# Patient Record
Sex: Male | Born: 1955
Health system: Southern US, Community
[De-identification: ages and names within clinical notes are randomized; demographics above are authoritative.]

## PROBLEM LIST (undated history)

## (undated) DIAGNOSIS — K219 Gastro-esophageal reflux disease without esophagitis: Secondary | ICD-10-CM

## (undated) DIAGNOSIS — E785 Hyperlipidemia, unspecified: Secondary | ICD-10-CM

## (undated) DIAGNOSIS — C3490 Malignant neoplasm of unspecified part of unspecified bronchus or lung: Secondary | ICD-10-CM

## (undated) DIAGNOSIS — M7552 Bursitis of left shoulder: Secondary | ICD-10-CM

## (undated) DIAGNOSIS — G2581 Restless legs syndrome: Secondary | ICD-10-CM

## (undated) HISTORY — PX: COLONOSCOPY: SHX174

## (undated) HISTORY — DX: Gastro-esophageal reflux disease without esophagitis: K21.9

## (undated) HISTORY — DX: Hyperlipidemia, unspecified: E78.5

## (undated) HISTORY — DX: Restless legs syndrome: G25.81

## (undated) HISTORY — DX: Bursitis of left shoulder: M75.52

---

## 2003-10-05 ENCOUNTER — Emergency Department (HOSPITAL_COMMUNITY): Admission: AD | Admit: 2003-10-05 | Discharge: 2003-10-05 | Payer: Self-pay | Admitting: Family Medicine

## 2003-10-08 ENCOUNTER — Emergency Department (HOSPITAL_COMMUNITY): Admission: AD | Admit: 2003-10-08 | Discharge: 2003-10-08 | Payer: Self-pay | Admitting: Family Medicine

## 2003-10-22 ENCOUNTER — Encounter: Admission: RE | Admit: 2003-10-22 | Discharge: 2003-10-22 | Payer: Self-pay | Admitting: Family Medicine

## 2003-10-27 ENCOUNTER — Encounter: Admission: RE | Admit: 2003-10-27 | Discharge: 2003-10-27 | Payer: Self-pay | Admitting: Family Medicine

## 2003-11-12 ENCOUNTER — Encounter: Admission: RE | Admit: 2003-11-12 | Discharge: 2003-11-12 | Payer: Self-pay | Admitting: Family Medicine

## 2003-12-13 ENCOUNTER — Encounter: Admission: RE | Admit: 2003-12-13 | Discharge: 2003-12-13 | Payer: Self-pay | Admitting: Family Medicine

## 2003-12-13 HISTORY — PX: CIRCUMCISION: SUR203

## 2004-06-13 ENCOUNTER — Emergency Department (HOSPITAL_COMMUNITY): Admission: EM | Admit: 2004-06-13 | Discharge: 2004-06-13 | Payer: Self-pay | Admitting: *Deleted

## 2006-11-14 DIAGNOSIS — K219 Gastro-esophageal reflux disease without esophagitis: Secondary | ICD-10-CM | POA: Insufficient documentation

## 2006-11-14 DIAGNOSIS — F528 Other sexual dysfunction not due to a substance or known physiological condition: Secondary | ICD-10-CM | POA: Insufficient documentation

## 2006-11-14 DIAGNOSIS — IMO0002 Reserved for concepts with insufficient information to code with codable children: Secondary | ICD-10-CM | POA: Insufficient documentation

## 2006-11-14 DIAGNOSIS — M75100 Unspecified rotator cuff tear or rupture of unspecified shoulder, not specified as traumatic: Secondary | ICD-10-CM | POA: Insufficient documentation

## 2006-11-14 DIAGNOSIS — E7800 Pure hypercholesterolemia, unspecified: Secondary | ICD-10-CM | POA: Insufficient documentation

## 2010-04-17 ENCOUNTER — Ambulatory Visit: Payer: Self-pay | Admitting: Cardiovascular Disease

## 2010-04-27 ENCOUNTER — Ambulatory Visit (HOSPITAL_COMMUNITY): Admission: RE | Admit: 2010-04-27 | Discharge: 2010-04-27 | Payer: Self-pay | Admitting: Interventional Cardiology

## 2010-08-24 ENCOUNTER — Observation Stay (HOSPITAL_COMMUNITY): Admission: EM | Admit: 2010-08-24 | Discharge: 2010-04-18 | Payer: Self-pay | Admitting: Emergency Medicine

## 2010-12-01 LAB — COMPREHENSIVE METABOLIC PANEL
ALT: 12 U/L (ref 0–53)
ALT: 13 U/L (ref 0–53)
AST: 23 U/L (ref 0–37)
AST: 24 U/L (ref 0–37)
Albumin: 3.1 g/dL — ABNORMAL LOW (ref 3.5–5.2)
Albumin: 3.4 g/dL — ABNORMAL LOW (ref 3.5–5.2)
Alkaline Phosphatase: 56 U/L (ref 39–117)
Alkaline Phosphatase: 59 U/L (ref 39–117)
BUN: 5 mg/dL — ABNORMAL LOW (ref 6–23)
BUN: 6 mg/dL (ref 6–23)
CO2: 27 mEq/L (ref 19–32)
CO2: 27 mEq/L (ref 19–32)
Calcium: 8.9 mg/dL (ref 8.4–10.5)
Calcium: 8.9 mg/dL (ref 8.4–10.5)
Chloride: 102 mEq/L (ref 96–112)
Chloride: 103 mEq/L (ref 96–112)
Creatinine, Ser: 0.97 mg/dL (ref 0.4–1.5)
Creatinine, Ser: 1.01 mg/dL (ref 0.4–1.5)
GFR calc Af Amer: >60 mL/min (ref >60)
GFR calc Af Amer: >60 mL/min (ref >60)
GFR calc non Af Amer: >60 mL/min (ref >60)
GFR calc non Af Amer: >60 mL/min (ref >60)
Glucose, Bld: 102 mg/dL — ABNORMAL HIGH (ref 70–99)
Glucose, Bld: 110 mg/dL — ABNORMAL HIGH (ref 70–99)
Potassium: 3.6 mEq/L (ref 3.5–5.1)
Potassium: 3.7 mEq/L (ref 3.5–5.1)
Sodium: 137 mEq/L (ref 135–145)
Sodium: 139 mEq/L (ref 135–145)
Total Bilirubin: 0.8 mg/dL (ref 0.3–1.2)
Total Bilirubin: 0.9 mg/dL (ref 0.3–1.2)
Total Protein: 6.6 g/dL (ref 6.0–8.3)
Total Protein: 6.9 g/dL (ref 6.0–8.3)

## 2010-12-01 LAB — CBC
HCT: 41.8 % (ref 39.0–52.0)
HCT: 42.6 % (ref 39.0–52.0)
Hemoglobin: 14.2 g/dL (ref 13.0–17.0)
Hemoglobin: 14.5 g/dL (ref 13.0–17.0)
MCH: 30.6 pg (ref 26.0–34.0)
MCH: 30.8 pg (ref 26.0–34.0)
MCHC: 33.9 g/dL (ref 30.0–36.0)
MCHC: 34.1 g/dL (ref 30.0–36.0)
MCV: 89.7 fL (ref 78.0–100.0)
MCV: 90.7 fL (ref 78.0–100.0)
Platelets: 197 10*3/uL (ref 150–400)
Platelets: 197 10*3/uL (ref 150–400)
RBC: 4.61 MIL/uL (ref 4.22–5.81)
RBC: 4.75 MIL/uL (ref 4.22–5.81)
RDW: 14.3 % (ref 11.5–15.5)
RDW: 14.4 % (ref 11.5–15.5)
WBC: 11.1 10*3/uL — ABNORMAL HIGH (ref 4.0–10.5)
WBC: 14.6 10*3/uL — ABNORMAL HIGH (ref 4.0–10.5)

## 2010-12-01 LAB — CULTURE, BLOOD (ROUTINE X 2)
Culture: NO GROWTH
Culture: NO GROWTH

## 2010-12-01 LAB — CARDIAC PANEL(CRET KIN+CKTOT+MB+TROPI)
CK, MB: 1.2 ng/mL (ref 0.3–4.0)
CK, MB: 3 ng/mL (ref 0.3–4.0)
Relative Index: 0.8 (ref 0.0–2.5)
Relative Index: 1.9 (ref 0.0–2.5)
Total CK: 154 U/L (ref 7–232)
Total CK: 155 U/L (ref 7–232)
Troponin I: 0.01 ng/mL (ref 0.00–0.06)
Troponin I: 0.04 ng/mL (ref 0.00–0.06)

## 2010-12-01 LAB — LIPID PANEL
Cholesterol: 148 mg/dL (ref 0–200)
HDL: 38 mg/dL — ABNORMAL LOW (ref >39)
LDL Cholesterol: 98 mg/dL (ref 0–99)
Total CHOL/HDL Ratio: 3.9 RATIO
Triglycerides: 61 mg/dL (ref <150)
VLDL: 12 mg/dL (ref 0–40)

## 2010-12-01 LAB — D-DIMER, QUANTITATIVE
D-Dimer, Quant: 0.32 ug/mL-FEU (ref 0.00–0.48)
D-Dimer, Quant: 0.35 ug/mL-FEU (ref 0.00–0.48)

## 2010-12-01 LAB — RAPID URINE DRUG SCREEN, HOSP PERFORMED
Amphetamines: NOT DETECTED
Barbiturates: NOT DETECTED
Benzodiazepines: NOT DETECTED
Cocaine: NOT DETECTED
Opiates: NOT DETECTED
Tetrahydrocannabinol: POSITIVE — AB

## 2010-12-01 LAB — DIFFERENTIAL
Basophils Absolute: 0 10*3/uL (ref 0.0–0.1)
Basophils Relative: 0 % (ref 0–1)
Eosinophils Absolute: 0.2 10*3/uL (ref 0.0–0.7)
Eosinophils Relative: 1 % (ref 0–5)
Lymphocytes Relative: 8 % — ABNORMAL LOW (ref 12–46)
Lymphs Abs: 1.2 10*3/uL (ref 0.7–4.0)
Monocytes Absolute: 1.2 10*3/uL — ABNORMAL HIGH (ref 0.1–1.0)
Monocytes Relative: 8 % (ref 3–12)
Neutro Abs: 12 10*3/uL — ABNORMAL HIGH (ref 1.7–7.7)
Neutrophils Relative %: 83 % — ABNORMAL HIGH (ref 43–77)

## 2010-12-01 LAB — POCT CARDIAC MARKERS
CKMB, poc: 1 ng/mL (ref 1.0–8.0)
CKMB, poc: 2.2 ng/mL (ref 1.0–8.0)
Myoglobin, poc: 149 ng/mL (ref 12–200)
Myoglobin, poc: 194 ng/mL (ref 12–200)
Troponin i, poc: <0.05 ng/mL (ref 0.00–0.09)
Troponin i, poc: <0.05 ng/mL (ref 0.00–0.09)

## 2010-12-01 LAB — C-REACTIVE PROTEIN: CRP: 9.5 mg/dL — ABNORMAL HIGH (ref <0.6)

## 2011-03-15 ENCOUNTER — Encounter: Payer: Self-pay | Admitting: Gastroenterology

## 2011-04-24 ENCOUNTER — Ambulatory Visit: Payer: Self-pay | Admitting: Gastroenterology

## 2012-01-31 ENCOUNTER — Encounter (HOSPITAL_COMMUNITY): Payer: Self-pay

## 2012-01-31 ENCOUNTER — Emergency Department (HOSPITAL_COMMUNITY)
Admission: EM | Admit: 2012-01-31 | Discharge: 2012-01-31 | Disposition: A | Discharge status: Home / Self Care | Payer: Self-pay | Attending: Emergency Medicine | Admitting: Emergency Medicine

## 2012-01-31 ENCOUNTER — Emergency Department (HOSPITAL_COMMUNITY): Payer: Self-pay

## 2012-01-31 DIAGNOSIS — R42 Dizziness and giddiness: Secondary | ICD-10-CM | POA: Insufficient documentation

## 2012-01-31 DIAGNOSIS — R51 Headache: Secondary | ICD-10-CM | POA: Insufficient documentation

## 2012-01-31 LAB — URINALYSIS, ROUTINE W REFLEX MICROSCOPIC
Nitrite: NEGATIVE
Protein, ur: 30 mg/dL — AB
Specific Gravity, Urine: 1.022 (ref 1.005–1.030)
Urobilinogen, UA: 1 mg/dL (ref 0.0–1.0)

## 2012-01-31 LAB — CBC
HCT: 47.8 % (ref 39.0–52.0)
Hemoglobin: 16.9 g/dL (ref 13.0–17.0)
MCV: 87.2 fL (ref 78.0–100.0)
RBC: 5.48 MIL/uL (ref 4.22–5.81)
WBC: 11.3 10*3/uL — ABNORMAL HIGH (ref 4.0–10.5)

## 2012-01-31 LAB — COMPREHENSIVE METABOLIC PANEL
ALT: 12 U/L (ref 0–53)
CO2: 25 mEq/L (ref 19–32)
Calcium: 9.4 mg/dL (ref 8.4–10.5)
Chloride: 99 mEq/L (ref 96–112)
Creatinine, Ser: 0.99 mg/dL (ref 0.50–1.35)
GFR calc Af Amer: >90 mL/min (ref >90)
GFR calc non Af Amer: 90 mL/min — ABNORMAL LOW (ref >90)
Glucose, Bld: 98 mg/dL (ref 70–99)
Total Bilirubin: 0.6 mg/dL (ref 0.3–1.2)

## 2012-01-31 LAB — DIFFERENTIAL
Eosinophils Relative: 1 % (ref 0–5)
Lymphocytes Relative: 16 % (ref 12–46)
Lymphs Abs: 1.8 10*3/uL (ref 0.7–4.0)
Monocytes Absolute: 1.7 10*3/uL — ABNORMAL HIGH (ref 0.1–1.0)

## 2012-01-31 LAB — URINE MICROSCOPIC-ADD ON

## 2012-01-31 MED ORDER — ACETAMINOPHEN 500 MG PO TABS
1000.0000 mg | ORAL_TABLET | ORAL | Status: AC
  Administered 2012-01-31: 1000 mg via ORAL
  Filled 2012-01-31: qty 2

## 2012-01-31 MED ORDER — SODIUM CHLORIDE 0.9 % IV BOLUS (SEPSIS)
1000.0000 mL | Freq: Once | INTRAVENOUS | Status: AC
  Administered 2012-01-31: 1000 mL via INTRAVENOUS

## 2012-01-31 MED ORDER — KETOROLAC TROMETHAMINE 30 MG/ML IJ SOLN
30.0000 mg | Freq: Once | INTRAMUSCULAR | Status: AC
  Administered 2012-01-31: 30 mg via INTRAVENOUS
  Filled 2012-01-31: qty 1

## 2012-01-31 NOTE — ED Notes (Signed)
Patient undressed and in a gown. Cardiac monitor, pulse oximetry, and blood pressure cuff on. 

## 2012-01-31 NOTE — ED Notes (Signed)
Urinal at bedside. Patient aware that urine specimen is needed.  

## 2012-01-31 NOTE — Discharge Instructions (Signed)
General Headache, Without Cause A general headache has no specific cause. These headaches are not life-threatening. They will not lead to other types of headaches. HOME CARE   Make and keep follow-up visits with your doctor.   Only take medicine as told by your doctor.   Try to relax, get a massage, or use your thoughts to control your body (biofeedback).   Apply cold or heat to the head and neck. Apply 3 or 4 times a day or as needed.  Finding out the results of your test Ask when your test results will be ready. Make sure you get your test results. GET HELP RIGHT AWAY IF:   You have problems with medicine.   Your medicine does not help relieve pain.   Your headache changes or becomes worse.   You feel sick to your stomach (nauseous) or throw up (vomit).   You have a temperature by mouth above 102 F (38.9 C), not controlled by medicine.   Your have a stiff neck.   You have vision loss.   You have muscle weakness.   You lose control of your muscles.   You lose balance or have trouble walking.   You feel like you are going to pass out (faint).  MAKE SURE YOU:   Understand these instructions.   Will watch this condition.   Will get help right away if you are not doing well or get worse.  Document Released: 06/12/2008 Document Revised: 08/23/2011 Document Reviewed: 06/12/2008 [employer] Patient Information 2012 [employer]. 

## 2012-01-31 NOTE — ED Provider Notes (Signed)
History     CSN: [id number]  Arrival date & time 01/31/12  1208   First MD Initiated Contact with Patient 01/31/12 1340      Chief Complaint  Patient presents with  . Dizziness    HPI The patient presents with 3 days of generalized complaints.  He notes that most prominently he has gradually developed lightheadedness, diffuse head pain, left axillary and arm pain.  She denies any focal chest pain, or dyspnea or confusion.  He notes that since onset symptoms have not been appreciably changed by anything.  He denies any fevers, chills, vomiting.  He does note that he is anorexic with mild nausea and has had several episodes of diarrhea. History reviewed. No pertinent past medical history.  Past Surgical History  Procedure Date  . Circumcision 12/13/2003    Family History  Problem Relation Age of Onset  . Esophageal cancer Brother   . Colon cancer Father   . Heart disease Brother   . Arthritis      History  Substance Use Topics  . Smoking status: Current Everyday Smoker -- 5.0 packs/day    Types: Cigars  . Smokeless tobacco: Not on file  . Alcohol Use: No      Review of Systems  Constitutional:       Per HPI, otherwise negative  HENT:       Per HPI, otherwise negative  Eyes: Negative.   Respiratory:       Per HPI, otherwise negative  Cardiovascular:       Per HPI, otherwise negative  Gastrointestinal: Negative for vomiting.  Genitourinary: Negative.   Musculoskeletal:       Per HPI, otherwise negative  Skin: Negative.   Neurological: Negative for syncope.    Allergies  Review of patient's allergies indicates no known allergies.  Home Medications  No current outpatient prescriptions on file.  BP 130/74  Pulse 89  Temp(Src) 100 F (37.8 C) (Oral)  Resp 18  SpO2 97%  Physical Exam  Nursing note and vitals reviewed. Constitutional: He is oriented to person, place, and time. He appears well-developed. No distress.  HENT:  Head: Normocephalic and  atraumatic.  Eyes: Conjunctivae and EOM are normal.  Cardiovascular: Normal rate and regular rhythm.   Pulmonary/Chest: Effort normal. No stridor. No respiratory distress.  Abdominal: He exhibits no distension.  Musculoskeletal: He exhibits no edema.  Neurological: He is alert and oriented to person, place, and time.  Skin: Skin is warm and dry.  Psychiatric: He has a normal mood and affect.    ED Course  Procedures (including critical care time)   Labs Reviewed  CBC  DIFFERENTIAL  COMPREHENSIVE METABOLIC PANEL  URINALYSIS, ROUTINE W REFLEX MICROSCOPIC   No results found.   No diagnosis found.   Date: 01/31/2012  Rate: 90  Rhythm: normal sinus rhythm  QRS Axis: normal  Intervals: normal  ST/T Wave abnormalities: normal  Conduction Disutrbances:none  Narrative Interpretation:   Old EKG Reviewed: unchanged NORMAL  Cardiac: 81 sr- normal  Pulse ox 99% ra- normal  4:00 PM The patient is resting comfortably, notes that he feels better. MDM  This generally well-appearing male presents with several days of headache, lightheadedness, mild generalized discomfort.  On my exam the patient is in no distress with no notable physical exam findings, and no neurovascular deficits.  The patient's labs and radiographic studies are unremarkable.  Given the patient's mild fever, there is some suspicion of ongoing viral illness.  Given the absence of notable  findings, he is stable for discharge with continued evaluation by his primary care physician.        Gerhard Munch, MD 01/31/12 [phone]

## 2012-01-31 NOTE — ED Notes (Signed)
Pt presents with 3 day h/o dizziness, L arm numbness and bilateral temporal headache.  Pt also reports L chest pain that radiates to L axilla and through to L scapula.  Pt reports intermittent shortness of breath, especially with exertion.  Pt reports dizziness worsens with movement.

## 2012-09-22 ENCOUNTER — Encounter (HOSPITAL_COMMUNITY): Payer: Self-pay | Admitting: Cardiology

## 2012-09-22 ENCOUNTER — Emergency Department (HOSPITAL_COMMUNITY)
Admission: EM | Admit: 2012-09-22 | Discharge: 2012-09-22 | Disposition: A | Discharge status: Home / Self Care | Payer: Self-pay | Attending: Emergency Medicine | Admitting: Emergency Medicine

## 2012-09-22 ENCOUNTER — Emergency Department (HOSPITAL_COMMUNITY): Payer: Self-pay

## 2012-09-22 DIAGNOSIS — R630 Anorexia: Secondary | ICD-10-CM | POA: Insufficient documentation

## 2012-09-22 DIAGNOSIS — R6889 Other general symptoms and signs: Secondary | ICD-10-CM

## 2012-09-22 DIAGNOSIS — R197 Diarrhea, unspecified: Secondary | ICD-10-CM | POA: Insufficient documentation

## 2012-09-22 DIAGNOSIS — IMO0001 Reserved for inherently not codable concepts without codable children: Secondary | ICD-10-CM | POA: Insufficient documentation

## 2012-09-22 DIAGNOSIS — R112 Nausea with vomiting, unspecified: Secondary | ICD-10-CM | POA: Insufficient documentation

## 2012-09-22 DIAGNOSIS — R42 Dizziness and giddiness: Secondary | ICD-10-CM | POA: Insufficient documentation

## 2012-09-22 DIAGNOSIS — E860 Dehydration: Secondary | ICD-10-CM | POA: Insufficient documentation

## 2012-09-22 DIAGNOSIS — F17200 Nicotine dependence, unspecified, uncomplicated: Secondary | ICD-10-CM | POA: Insufficient documentation

## 2012-09-22 DIAGNOSIS — R109 Unspecified abdominal pain: Secondary | ICD-10-CM | POA: Insufficient documentation

## 2012-09-22 DIAGNOSIS — R509 Fever, unspecified: Secondary | ICD-10-CM | POA: Insufficient documentation

## 2012-09-22 LAB — CBC WITH DIFFERENTIAL/PLATELET
Basophils Relative: 1 % (ref 0–1)
Eosinophils Absolute: 0 10*3/uL (ref 0.0–0.7)
Eosinophils Relative: 0 % (ref 0–5)
HCT: 41.9 % (ref 39.0–52.0)
Hemoglobin: 14.3 g/dL (ref 13.0–17.0)
Lymphs Abs: 0.4 10*3/uL — ABNORMAL LOW (ref 0.7–4.0)
MCH: 29.1 pg (ref 26.0–34.0)
MCHC: 34.1 g/dL (ref 30.0–36.0)
MCV: 85.3 fL (ref 78.0–100.0)
Monocytes Absolute: 0.5 10*3/uL (ref 0.1–1.0)
Monocytes Relative: 16 % — ABNORMAL HIGH (ref 3–12)
RBC: 4.91 MIL/uL (ref 4.22–5.81)

## 2012-09-22 LAB — URINE MICROSCOPIC-ADD ON

## 2012-09-22 LAB — URINALYSIS, ROUTINE W REFLEX MICROSCOPIC
Bilirubin Urine: NEGATIVE
Glucose, UA: NEGATIVE mg/dL
Ketones, ur: 15 mg/dL — AB
Nitrite: NEGATIVE
pH: 5.5 (ref 5.0–8.0)

## 2012-09-22 LAB — BASIC METABOLIC PANEL
BUN: 10 mg/dL (ref 6–23)
Creatinine, Ser: 1.08 mg/dL (ref 0.50–1.35)
GFR calc Af Amer: 87 mL/min — ABNORMAL LOW (ref >90)
GFR calc non Af Amer: 75 mL/min — ABNORMAL LOW (ref >90)
Glucose, Bld: 99 mg/dL (ref 70–99)

## 2012-09-22 MED ORDER — ONDANSETRON 4 MG PO TBDP: 4.0000 mg | ORAL_TABLET | Freq: Three times a day (TID) | ORAL | Status: DC | PRN

## 2012-09-22 MED ORDER — SODIUM CHLORIDE 0.9 % IV BOLUS (SEPSIS)
1000.0000 mL | Freq: Once | INTRAVENOUS | Status: AC
  Administered 2012-09-22: 1000 mL via INTRAVENOUS

## 2012-09-22 MED ORDER — IBUPROFEN 800 MG PO TABS
800.0000 mg | ORAL_TABLET | Freq: Once | ORAL | Status: AC
  Administered 2012-09-22: 800 mg via ORAL
  Filled 2012-09-22: qty 1

## 2012-09-22 MED ORDER — ONDANSETRON HCL 4 MG/2ML IJ SOLN
4.0000 mg | Freq: Once | INTRAMUSCULAR | Status: AC
  Administered 2012-09-22: 4 mg via INTRAVENOUS
  Filled 2012-09-22: qty 2

## 2012-09-22 NOTE — ED Notes (Signed)
PT to ED c/o dizziness, nausea, abd pain, diarrhea and arthralgias for several days.  Per pt wife, pt has not been eating.  Pt febrile at this time.  Lung sounds slightly diminished post.  Pt states he is a smoker.

## 2012-09-22 NOTE — ED Provider Notes (Signed)
I saw and evaluated the patient, reviewed the resident's note and I agree with the findings and plan.  Pt seen and examined--non toxic appearing, likely viral, stable for d/c  Toy Baker, MD 09/22/12 1906

## 2012-09-22 NOTE — ED Notes (Signed)
Pt reports headache and abd pain that started about 2 days ago. Reports he feels like he can't eat, and the "food gets stuck". Reports increased urination.

## 2012-09-22 NOTE — ED Provider Notes (Signed)
History     CSN: [id number]  Arrival date & time 09/22/12  1301   None     Chief Complaint  Patient presents with  . Headache  . Abdominal Pain     HPI chief complaint: Flulike symptoms. Onset: More than 2 days ago. Severity: Mild. Timing: Constant. For signs and symptoms the review of systems. Regarding social history see nurse's notes. No family history of recent pneumonia. I have reviewed patient's past medical, past surgical, social history as well as medications and allergies.  History reviewed. No pertinent past medical history.  Past Surgical History  Procedure Date  . Circumcision 12/13/2003    Family History  Problem Relation Age of Onset  . Esophageal cancer Brother   . Colon cancer Father   . Heart disease Brother   . Arthritis      History  Substance Use Topics  . Smoking status: Current Every Day Smoker -- 5.0 packs/day    Types: Cigars  . Smokeless tobacco: Not on file  . Alcohol Use: No      Review of Systems  Constitutional: Positive for appetite change. Negative for fever and chills.  HENT: Negative for hearing loss, ear pain, congestion, sore throat, facial swelling, rhinorrhea, sneezing, mouth sores, trouble swallowing, neck pain, neck stiffness, voice change, sinus pressure and ear discharge.   Eyes: Negative for visual disturbance.  Respiratory: Negative for cough, chest tightness and shortness of breath.   Cardiovascular: Negative for chest pain, palpitations and leg swelling.  Gastrointestinal: Positive for nausea, vomiting and diarrhea. Negative for abdominal pain, constipation, blood in stool and abdominal distention.  Genitourinary: Positive for frequency. Negative for dysuria, urgency, hematuria, flank pain, decreased urine volume, scrotal swelling, difficulty urinating and testicular pain.  Musculoskeletal: Positive for myalgias. Negative for back pain, joint swelling, arthralgias and gait problem.  Skin: Negative for rash.  Neurological:  Negative for dizziness, tremors, seizures, syncope, facial asymmetry, speech difficulty, weakness, light-headedness, numbness and headaches.  Hematological: Negative for adenopathy. Does not bruise/bleed easily.  Psychiatric/Behavioral: Negative for confusion.    Allergies  Review of patient's allergies indicates no known allergies.  Home Medications  No current outpatient prescriptions on file.  BP 139/78  Pulse 80  Temp 101 F (38.3 C) (Oral)  Resp 18  SpO2 96%  Physical Exam  Constitutional: He is oriented to person, place, and time. He appears well-developed and well-nourished. No distress.  HENT:  Head: Normocephalic and atraumatic.  Eyes: Conjunctivae normal are normal. Pupils are equal, round, and reactive to light. Right eye exhibits no discharge. Left eye exhibits no discharge. No scleral icterus.  Neck: Normal range of motion. Neck supple.  Cardiovascular: Normal rate, regular rhythm, normal heart sounds and intact distal pulses.   No murmur heard. Pulmonary/Chest: Effort normal and breath sounds normal. No respiratory distress. He has no wheezes. He has no rales. He exhibits no tenderness.  Abdominal: Soft. Bowel sounds are normal. He exhibits no distension and no mass. There is no tenderness. There is no rebound and no guarding.  Musculoskeletal: Normal range of motion. He exhibits no edema and no tenderness.  Neurological: He is alert and oriented to person, place, and time.  Skin: Skin is warm and dry. He is not diaphoretic.  Psychiatric: He has a normal mood and affect.    ED Course  Procedures (including critical care time)  Labs Reviewed  URINALYSIS, ROUTINE W REFLEX MICROSCOPIC - Abnormal; Notable for the following:    Hgb urine dipstick SMALL (*)  Ketones, ur 15 (*)     Protein, ur 30 (*)     All other components within normal limits  CBC WITH DIFFERENTIAL - Abnormal; Notable for the following:    WBC 2.8 (*)     Platelets 133 (*)     Lymphs Abs 0.4  (*)     Monocytes Relative 16 (*)     All other components within normal limits  BASIC METABOLIC PANEL - Abnormal; Notable for the following:    Sodium 132 (*)     Chloride 95 (*)     GFR calc non Af Amer 75 (*)     GFR calc Af Amer 87 (*)     All other components within normal limits  URINE MICROSCOPIC-ADD ON   Dg Chest 2 View  09/22/2012  *RADIOLOGY REPORT*  Clinical Data: Shortness of breath.  CHEST - 2 VIEW  Comparison: Jan 31, 2012.  Findings: Cardiomediastinal silhouette appears normal.  No acute pulmonary disease.  Bony thorax is noted.  Mild left apical scarring is unchanged.  IMPRESSION: No acute cardiopulmonary abnormality seen.   Original Report Authenticated By: Lupita Raider.,  M.D.      1. Flu-like symptoms   2. Dehydration       MDM  Patient is a well-appearing 57 year old male presents with 2 days of flulike symptoms. Generalize myalgias. Patient adamantly denies any headache or abdominal pain at this time only stating he hurts everywhere. Decreased appetite and mild intermittent dizziness with standing only but none presently. Patient is febrile but other vital stable. Well-appearing on exam. No concern for meningitis or encephalitis at this time. No concern for intra-abdominal pathology. Urinalysis pending. Chest x-ray unremarkable. Basic labs pending. Hydrate, provide nausea medication.  Labs unremarkable. Patient feeling much better hydration. Tamiflu held given patient is outside the window. Symptomatic treatment encourage. Discharged with Zofran for nausea.        Consuello Masse, MD 09/23/12 [phone]

## 2012-09-22 NOTE — ED Notes (Signed)
Pt dc to home.  Pt ambulatory to exit without difficulty.  Pt denies need for w/c.

## 2012-09-28 NOTE — ED Provider Notes (Signed)
I saw and evaluated the patient, reviewed the resident's note and I agree with the findings and plan.  Latrel Szymczak T Rutilio Yellowhair, MD 09/28/12 2136 

## 2012-11-01 ENCOUNTER — Other Ambulatory Visit: Payer: Self-pay

## 2013-05-16 ENCOUNTER — Emergency Department (INDEPENDENT_AMBULATORY_CARE_PROVIDER_SITE_OTHER)
Admission: EM | Admit: 2013-05-16 | Discharge: 2013-05-16 | Disposition: A | Discharge status: Home / Self Care | Payer: Self-pay | Source: Home / Self Care | Attending: Emergency Medicine | Admitting: Emergency Medicine

## 2013-05-16 ENCOUNTER — Encounter (HOSPITAL_COMMUNITY): Payer: Self-pay

## 2013-05-16 DIAGNOSIS — K047 Periapical abscess without sinus: Secondary | ICD-10-CM

## 2013-05-16 MED ORDER — HYDROCODONE-ACETAMINOPHEN 5-325 MG PO TABS: 1.0000 | ORAL_TABLET | ORAL | Status: DC | PRN

## 2013-05-16 MED ORDER — PENICILLIN V POTASSIUM 500 MG PO TABS: 500.0000 mg | ORAL_TABLET | Freq: Four times a day (QID) | ORAL | Status: DC

## 2013-05-16 MED ORDER — KETOROLAC TROMETHAMINE 60 MG/2ML IM SOLN
INTRAMUSCULAR | Status: AC
  Filled 2013-05-16: qty 2

## 2013-05-16 MED ORDER — KETOROLAC TROMETHAMINE 60 MG/2ML IM SOLN
60.0000 mg | Freq: Once | INTRAMUSCULAR | Status: AC
  Administered 2013-05-16: 60 mg via INTRAMUSCULAR

## 2013-05-16 MED ORDER — METRONIDAZOLE 500 MG PO TABS: 500.0000 mg | ORAL_TABLET | Freq: Two times a day (BID) | ORAL | Status: DC

## 2013-05-16 NOTE — ED Provider Notes (Signed)
CSN: [id number]     Arrival date & time 05/16/13  1426 History   First MD Initiated Contact with Patient 05/16/13 1608     Chief Complaint  Patient presents with  . Oral Swelling   (Consider location/radiation/quality/duration/timing/severity/associated sxs/prior Treatment) The history is provided by the patient. No language interpreter was used.  TOOTHACHE X SEVERAL DAYS RIGHT JAW SWELLING X 2 DAYS NO FEVER HISTORY OF MULTIPLE DENTAL CARIES  History reviewed. No pertinent past medical history. Past Surgical History  Procedure Laterality Date  . Circumcision  12/13/2003   Family History  Problem Relation Age of Onset  . Esophageal cancer Brother   . Colon cancer Father   . Heart disease Brother   . Arthritis     History  Substance Use Topics  . Smoking status: Current Every Day Smoker -- 5.00 packs/day    Types: Cigars  . Smokeless tobacco: Not on file  . Alcohol Use: No    Review of Systems  HENT:       C/O TOOTHACHE,JAW SWELLING    Allergies  Lactose intolerance (gi)  Home Medications   Current Outpatient Rx  Name  Route  Sig  Dispense  Refill  . fish oil-omega-3 fatty acids 1000 MG capsule   Oral   Take 2 g by mouth daily.         . GuaiFENesin (MUCINEX PO)   Oral   Take 15 mLs by mouth daily as needed. For cold         . HYDROcodone-acetaminophen (NORCO/VICODIN) 5-325 MG per tablet   Oral   Take 1 tablet by mouth every 4 (four) hours as needed for pain.   20 tablet   0   . Ibuprofen-Diphenhydramine Cit (ADVIL PM PO)   Oral   Take 2 tablets by mouth daily as needed. For sleep         . metroNIDAZOLE (FLAGYL) 500 MG tablet   Oral   Take 1 tablet (500 mg total) by mouth 2 (two) times daily.   14 tablet   0   . ondansetron (ZOFRAN ODT) 4 MG disintegrating tablet   Oral   Take 1 tablet (4 mg total) by mouth every 8 (eight) hours as needed for nausea.   8 tablet   0   . penicillin v potassium (VEETID) 500 MG tablet   Oral   Take 1  tablet (500 mg total) by mouth 4 (four) times daily.   40 tablet   0    BP 90/65  Pulse 86  Temp(Src) 98.6 F (37 C) (Oral)  Resp 16  SpO2 98% Physical Exam  Nursing note and vitals reviewed. Constitutional: He is oriented to person, place, and time. He appears well-developed and well-nourished.  HENT:  Head: Normocephalic and atraumatic.  Mouth/Throat: Oropharynx is clear and moist.  MINIMAL SWELLING RIGHT MANDIBLE NO LYMPHADENOPATHY MULTIPLE DENTAL CARIES SWOLLEN GINGIVA RIGHT LOWER TEETH  Eyes: Conjunctivae are normal. Pupils are equal, round, and reactive to light.  Neck: Normal range of motion. Neck supple.  Cardiovascular: Normal rate, regular rhythm, normal heart sounds and intact distal pulses.   No murmur heard. Pulmonary/Chest: Effort normal and breath sounds normal.  Abdominal: Soft. Bowel sounds are normal. He exhibits no distension and no mass. There is no tenderness.  Musculoskeletal: Normal range of motion.  Neurological: He is alert and oriented to person, place, and time. No cranial nerve deficit. He exhibits normal muscle tone. Coordination normal.  Skin: Skin is warm and dry.  Psychiatric:  He has a normal mood and affect.    ED Course  Procedures (including critical care time) Labs Review Labs Reviewed - No data to display Imaging Review No results found.  MDM   1. Dental abscess       Duwayne Heck de Marcello Moores, MD 05/16/13 [phone]

## 2013-05-16 NOTE — ED Notes (Signed)
Painful tooth for past few days; minimal relief w naproxyn

## 2013-07-23 ENCOUNTER — Other Ambulatory Visit: Payer: Self-pay

## 2016-03-16 DIAGNOSIS — Z716 Tobacco abuse counseling: Secondary | ICD-10-CM | POA: Diagnosis not present

## 2016-03-16 DIAGNOSIS — G2581 Restless legs syndrome: Secondary | ICD-10-CM | POA: Diagnosis not present

## 2016-03-16 DIAGNOSIS — K219 Gastro-esophageal reflux disease without esophagitis: Secondary | ICD-10-CM | POA: Diagnosis not present

## 2016-03-16 DIAGNOSIS — Z1389 Encounter for screening for other disorder: Secondary | ICD-10-CM | POA: Diagnosis not present

## 2016-03-16 DIAGNOSIS — R636 Underweight: Secondary | ICD-10-CM | POA: Diagnosis not present

## 2016-04-10 ENCOUNTER — Encounter: Payer: Self-pay | Admitting: Internal Medicine

## 2016-04-20 DIAGNOSIS — E7800 Pure hypercholesterolemia, unspecified: Secondary | ICD-10-CM | POA: Diagnosis not present

## 2016-04-20 DIAGNOSIS — G2581 Restless legs syndrome: Secondary | ICD-10-CM | POA: Diagnosis not present

## 2016-04-20 DIAGNOSIS — S4491XA Injury of unspecified nerve at shoulder and upper arm level, right arm, initial encounter: Secondary | ICD-10-CM | POA: Diagnosis not present

## 2016-04-20 DIAGNOSIS — H8110 Benign paroxysmal vertigo, unspecified ear: Secondary | ICD-10-CM | POA: Diagnosis not present

## 2016-04-27 DIAGNOSIS — G40909 Epilepsy, unspecified, not intractable, without status epilepticus: Secondary | ICD-10-CM | POA: Diagnosis not present

## 2016-04-27 DIAGNOSIS — R55 Syncope and collapse: Secondary | ICD-10-CM | POA: Diagnosis not present

## 2016-04-27 DIAGNOSIS — R42 Dizziness and giddiness: Secondary | ICD-10-CM | POA: Diagnosis not present

## 2016-04-28 DIAGNOSIS — R55 Syncope and collapse: Secondary | ICD-10-CM | POA: Diagnosis not present

## 2016-04-28 DIAGNOSIS — G40909 Epilepsy, unspecified, not intractable, without status epilepticus: Secondary | ICD-10-CM | POA: Diagnosis not present

## 2016-04-28 DIAGNOSIS — R42 Dizziness and giddiness: Secondary | ICD-10-CM | POA: Diagnosis not present

## 2016-06-04 ENCOUNTER — Ambulatory Visit (AMBULATORY_SURGERY_CENTER): Payer: Self-pay | Admitting: *Deleted

## 2016-06-04 VITALS — Ht 72.0 in | Wt 128.0 lb

## 2016-06-04 DIAGNOSIS — Z1211 Encounter for screening for malignant neoplasm of colon: Secondary | ICD-10-CM

## 2016-06-18 ENCOUNTER — Ambulatory Visit (AMBULATORY_SURGERY_CENTER): Payer: BLUE CROSS/BLUE SHIELD | Admitting: Internal Medicine

## 2016-06-18 ENCOUNTER — Encounter: Payer: Self-pay | Admitting: Internal Medicine

## 2016-06-18 VITALS — BP 111/78 | HR 76 | Temp 98.0°F | Resp 13 | Ht 72.0 in | Wt 128.0 lb

## 2016-06-18 DIAGNOSIS — Z1211 Encounter for screening for malignant neoplasm of colon: Secondary | ICD-10-CM

## 2016-06-18 MED ORDER — SODIUM CHLORIDE 0.9 % IV SOLN: 500.0000 mL | INTRAVENOUS | Status: DC

## 2016-06-18 NOTE — Progress Notes (Signed)
Report given to PACU RN, vss 

## 2016-06-18 NOTE — Patient Instructions (Addendum)
Colonoscopy was normal.  Next routine colonoscopy in 10 years - 2027  I appreciate the opportunity to care for you. Gatha Mayer, MD, FACG    YOU HAD AN ENDOSCOPIC PROCEDURE TODAY AT [facility]:   Refer to the procedure report that was given to you for any specific questions about what was found during the examination.  If the procedure report does not answer your questions, please call your gastroenterologist to clarify.  If you requested that your care partner not be given the details of your procedure findings, then the procedure report has been included in a sealed envelope for you to review at your convenience later.  YOU SHOULD EXPECT: Some feelings of bloating in the abdomen. Passage of more gas than usual.  Walking can help get rid of the air that was put into your GI tract during the procedure and reduce the bloating. If you had a lower endoscopy (such as a colonoscopy or flexible sigmoidoscopy) you may notice spotting of blood in your stool or on the toilet paper. If you underwent a bowel prep for your procedure, you may not have a normal bowel movement for a few days.  Please Note:  You might notice some irritation and congestion in your nose or some drainage.  This is from the oxygen used during your procedure.  There is no need for concern and it should clear up in a day or so.  SYMPTOMS TO REPORT IMMEDIATELY:   Following lower endoscopy (colonoscopy or flexible sigmoidoscopy):  Excessive amounts of blood in the stool  Significant tenderness or worsening of abdominal pains  Swelling of the abdomen that is new, acute  Fever of 100F or higher   Following upper endoscopy (EGD)  Vomiting of blood or coffee ground material  New chest pain or pain under the shoulder blades  Painful or persistently difficult swallowing  New shortness of breath  Fever of 100F or higher  Black, tarry-looking stools  For urgent or emergent issues, a  gastroenterologist can be reached at any hour by calling [phone].   DIET:  We do recommend a small meal at first, but then you may proceed to your regular diet.  Drink plenty of fluids but you should avoid alcoholic beverages for 24 hours.  ACTIVITY:  You should plan to take it easy for the rest of today and you should NOT DRIVE or use heavy machinery until tomorrow (because of the sedation medicines used during the test).    FOLLOW UP: Our staff will call the number listed on your records the next business day following your procedure to check on you and address any questions or concerns that you may have regarding the information given to you following your procedure. If we do not reach you, we will leave a message.  However, if you are feeling well and you are not experiencing any problems, there is no need to return our call.  We will assume that you have returned to your regular daily activities without incident.  If any biopsies were taken you will be contacted by phone or by letter within the next 1-3 weeks.  Please call us at [phone] if you have not heard about the biopsies in 3 weeks.    SIGNATURES/CONFIDENTIALITY: You and/or your care partner have signed paperwork which will be entered into your electronic medical record.  These signatures attest to the fact that that the information above on your After Visit Summary has been reviewed and  is understood.  Full responsibility of the confidentiality of this discharge information lies with you and/or your care-partner.     You may resume your current medications today. Next routine colonoscopy in 10 years per D. Gressner. Please call if any questions or concerns.

## 2016-06-18 NOTE — Op Note (Signed)
[facility] Patient Name: Curtis Henry Procedure Date: 06/18/2016 10:25 AM MRN: [id number] Endoscopist: Gatha Mayer , MD Age: 60 Referring MD:  Date of Birth: August 22, 1956 Gender: Male Account #: [id number] Procedure:                Colonoscopy Indications:              Screening for colorectal malignant neoplasm, This                            is the patient's first colonoscopy Medicines:                Propofol per Anesthesia, Monitored Anesthesia Care Procedure:                Pre-Anesthesia Assessment:                           - Prior to the procedure, a History and Physical                            was performed, and patient medications and                            allergies were reviewed. The patient's tolerance of                            previous anesthesia was also reviewed. The risks                            and benefits of the procedure and the sedation                            options and risks were discussed with the patient.                            All questions were answered, and informed consent                            was obtained. Prior Anticoagulants: The patient has                            taken no previous anticoagulant or antiplatelet                            agents. ASA Grade Assessment: II - A patient with                            mild systemic disease. After reviewing the risks                            and benefits, the patient was deemed in                            satisfactory condition to undergo the procedure.  After obtaining informed consent, the colonoscope                            was passed under direct vision. Throughout the                            procedure, the patient's blood pressure, pulse, and                            oxygen saturations were monitored continuously. The                            Model CF-HQ190L [id number]) scope was introduced   through the anus and advanced to the the cecum,                            identified by appendiceal orifice and ileocecal                            valve. The quality of the bowel preparation was                            excellent. The colonoscopy was performed without                            difficulty. The patient tolerated the procedure                            well. The bowel preparation used was Miralax. The                            ileocecal valve, appendiceal orifice, and rectum                            were photographed. Scope In: 10:42:05 AM Scope Out: 10:51:59 AM Scope Withdrawal Time: 0 hours 7 minutes 16 seconds  Total Procedure Duration: 0 hours 9 minutes 54 seconds  Findings:                 The perianal and digital rectal examinations were                            normal. Pertinent negatives include normal prostate                            (size, shape, and consistency).                           The colon (entire examined portion) appeared normal.                           No additional abnormalities were found on                            retroflexion. Complications:  No immediate complications. Estimated blood loss:                            None. Estimated Blood Loss:     Estimated blood loss: none. Impression:               - The entire examined colon is normal.                           - No specimens collected. Recommendation:           - Repeat colonoscopy in 10 years for screening                            purposes.                           - Patient has a contact number available for                            emergencies. The signs and symptoms of potential                            delayed complications were discussed with the                            patient. Return to normal activities tomorrow.                            Written discharge instructions were provided to the                            patient.                            - Resume previous diet.                           - Continue present medications.                           - Patient has a contact number available for                            emergencies. The signs and symptoms of potential                            delayed complications were discussed with the                            patient. Return to normal activities tomorrow.                            Written discharge instructions were provided to the                            patient. Gatha Mayer,  MD 06/18/2016 10:57:09 AM This report has been signed electronically.

## 2016-06-18 NOTE — Progress Notes (Signed)
No problems noted in the recovery room. maw 

## 2016-06-19 ENCOUNTER — Telehealth: Payer: Self-pay

## 2016-06-19 NOTE — Telephone Encounter (Signed)
  Follow up Call-  Call back number 06/18/2016  Post procedure Call Back phone  # [phone]  Permission to leave phone message Yes  Some recent data might be hidden    Patient was called for follow up after his procedure on 06/18/2016. No answer at the number given for follow up phone call. I was not able to leave a message due to mail box not being set up.

## 2016-06-19 NOTE — Telephone Encounter (Signed)
  Follow up Call-  Call back number 06/18/2016  Post procedure Call Back phone  # [phone]  Permission to leave phone message Yes  Some recent data might be hidden     Patient questions:  Do you have a fever, pain , or abdominal swelling? No. Pain Score  0 *  Have you tolerated food without any problems? Yes.    Have you been able to return to your normal activities? Yes.    Do you have any questions about your discharge instructions: Diet   No. Medications  No. Follow up visit  No.  Do you have questions or concerns about your Care? No.  Actions: * If pain score is 4 or above: No action needed, pain <4.

## 2016-12-12 ENCOUNTER — Encounter: Payer: Self-pay | Admitting: Internal Medicine

## 2016-12-12 ENCOUNTER — Ambulatory Visit (INDEPENDENT_AMBULATORY_CARE_PROVIDER_SITE_OTHER): Payer: BLUE CROSS/BLUE SHIELD | Admitting: Internal Medicine

## 2016-12-12 VITALS — BP 130/68 | HR 61 | Temp 98.1°F | Ht 72.0 in | Wt 122.0 lb

## 2016-12-12 DIAGNOSIS — K219 Gastro-esophageal reflux disease without esophagitis: Secondary | ICD-10-CM | POA: Diagnosis not present

## 2016-12-12 DIAGNOSIS — G2581 Restless legs syndrome: Secondary | ICD-10-CM

## 2016-12-12 DIAGNOSIS — Z720 Tobacco use: Secondary | ICD-10-CM | POA: Diagnosis not present

## 2016-12-12 DIAGNOSIS — Z122 Encounter for screening for malignant neoplasm of respiratory organs: Secondary | ICD-10-CM

## 2016-12-12 DIAGNOSIS — M25512 Pain in left shoulder: Secondary | ICD-10-CM | POA: Diagnosis not present

## 2016-12-12 DIAGNOSIS — R252 Cramp and spasm: Secondary | ICD-10-CM

## 2016-12-12 DIAGNOSIS — E785 Hyperlipidemia, unspecified: Secondary | ICD-10-CM | POA: Diagnosis not present

## 2016-12-12 DIAGNOSIS — Z23 Encounter for immunization: Secondary | ICD-10-CM

## 2016-12-12 DIAGNOSIS — G8929 Other chronic pain: Secondary | ICD-10-CM

## 2016-12-12 DIAGNOSIS — Z7689 Persons encountering health services in other specified circumstances: Secondary | ICD-10-CM | POA: Diagnosis not present

## 2016-12-12 MED ORDER — ROPINIROLE HCL 0.25 MG PO TABS: 0.2500 mg | ORAL_TABLET | Freq: Every day | ORAL | 2 refills | Status: DC

## 2016-12-12 MED ORDER — VARENICLINE TARTRATE 0.5 MG PO TABS: ORAL_TABLET | ORAL | 0 refills | Status: DC

## 2016-12-12 NOTE — Patient Instructions (Addendum)
Curtis Henry,  It was nice to meet you today.  I have ordered some labs to see if anemia or electrolytes could be causing leg cramps.  I will refer your to Sports Medicine for your L shoulder pain. You'll get a call about that referral within the week.  For your ears, I would get over-the-counter softener (debrox for example) to clean out your ears. The ear drums looked good.  CT chest is to screen for lung cancer.   For smoking, I have prescribed chantix. Pick a quit date and start the medicine 1 week prior. Please stop if you have violent dreams. You will build up to 1 mg twice daily. If things are going well on 1 mg twice daily, please call clinic before you run out, and I will prescribe you 1 mg tablets. Typical treatment is for 12 weeks.   Best, Dr. Ola Spurr

## 2016-12-12 NOTE — Progress Notes (Signed)
[facility] Progress Note  Subjective:  Curtis Henry is a 61 y.o. male who presents to establish care.  Current concerns: Chronic L shoulder pain and nighttime leg cramping. Gets dizzy while working if does not drink enough water.   PMH: - Tobacco abuse: has smoked cigars for over 40 years. Used to smoke a pack of cigars a day but now cut back to 1 a day. Would like to quit. Has tried nicotine replacement in past without success.  - HLD: was on simvastatin but stopped 5-6 months ago because didn't know if he still needed it - Possible restless leg syndrome: Ropinirole helped some in past. Tried gabapentin but did not help. Taking advil PM now to try to help with sleep. Happens only at night.  - GERD: Takes prilosec prn - Left shoulder pain: Has had pain and stiffness since 2005. Normal 3-view shoulder x-ray in 2005. - Very thin: Patient says he has always been this way and has very fast metabolism  Surgeries/Hospitalizations: - Had overnight hospitalization for chest pain in 2011; negative exercise stress test at that time - Denies ever having had surgery  Family History: - Father and brother had lung cancer (though previous records say esophageal cancer in brother and colon cancer in father) - Father had history of alcohol abuse - Mother died when patient was young  Social: - works at [employer] - married and has daughter - wife has battled cancer a few times and currently undergoing chemotherapy - [employer] - Does not exercise regularly - Smokes cigars (since age 25 or 54) and marijuana - Occasionally drinks beer or liquor, sometimes twice a month and only 1-2 drinks - Feels safe in relationship - PHQ2 0  Allergies  Allergen Reactions  . Lactose Intolerance (Gi) Other (See Comments)    Unsettled bowel.    Objective: Blood pressure 130/68, pulse 61, temperature 98.1 F (36.7 C), temperature source Oral, height 6' (1.829 m), weight 122 lb (55.3 kg), SpO2  99 %. Body mass index is 16.55 kg/m. Constitutional: Very thin male, pleasant, in NAD HENT: Poor dentition. TMs normal bilaterally. Normal posterior oropharynx. Cardiovascular: RRR, S1, S2, no m/r/g.  Pulmonary/Chest: Effort normal and breath sounds normal. No respiratory distress.  Abdominal: Soft. +BS, NT, ND Musculoskeletal: TTP over anterior and posterior aspects of L shoulder girdle. FROM. No weakness with Empty Can test but did have pain when downward pressure applied to abducted L shoulder. TTP over L upper back.  Neurological: AOx3, no focal deficits. Skin: Skin is warm and dry. No rash noted.   Psychiatric: Normal mood and affect.  Vitals reviewed  Assessment/Plan: Leg cramps - May be restless leg syndrome as worse at night and improved with ropinirole in past - Will obtain labs to rule out vitamin D deficiency, anemia, or electrolyte abnormality as cause - Not presently on statin and said tolerated in the past - Will refill ropinirole  Tobacco abuse - Patient would like to quit. Interested in trying chantix. - Prescribed chantix with instructions on how to taper up.  - Patient to call for refill if tolerating 1 mg BID dosing.  - Recommended lung cancer screening with low dose CT. Placed orders.   Shoulder pain - Suspect chronic bursitis given pain but lack of weakness - Will refer to Sports Medicine as has never had [place] of shoulder and symptoms ongoing for over 10 years  GASTROESOPHAGEAL REFLUX, NO ESOPHAGITIS - Refilled prilosec  Follow-up prn. Would obtain HIV screening and TSH  given very low weight. Poor dentition may also be contributing, and patient says he will be seeing dentist.   [name], MD [place], PGY-2

## 2016-12-13 LAB — CBC
HEMOGLOBIN: 15.2 g/dL (ref 13.0–17.7)
Hematocrit: 46.2 % (ref 37.5–51.0)
MCH: 28.6 pg (ref 26.6–33.0)
MCHC: 32.9 g/dL (ref 31.5–35.7)
MCV: 87 fL (ref 79–97)
Platelets: 223 10*3/uL (ref 150–379)
RBC: 5.31 x10E6/uL (ref 4.14–5.80)
RDW: 14 % (ref 12.3–15.4)
WBC: 8.2 10*3/uL (ref 3.4–10.8)

## 2016-12-13 LAB — COMPREHENSIVE METABOLIC PANEL
ALT: 12 IU/L (ref 0–44)
AST: 23 IU/L (ref 0–40)
Albumin/Globulin Ratio: 1.4 (ref 1.2–2.2)
Albumin: 4.2 g/dL (ref 3.6–4.8)
Alkaline Phosphatase: 77 IU/L (ref 39–117)
BUN/Creatinine Ratio: 4 — ABNORMAL LOW (ref 10–24)
BUN: 4 mg/dL — ABNORMAL LOW (ref 8–27)
Bilirubin Total: 0.4 mg/dL (ref 0.0–1.2)
CALCIUM: 9.7 mg/dL (ref 8.6–10.2)
CO2: 28 mmol/L (ref 18–29)
CREATININE: 0.96 mg/dL (ref 0.76–1.27)
Chloride: 101 mmol/L (ref 96–106)
GFR calc Af Amer: 98 mL/min/{1.73_m2} (ref >59)
GFR, EST NON AFRICAN AMERICAN: 85 mL/min/{1.73_m2} (ref >59)
Globulin, Total: 3.1 g/dL (ref 1.5–4.5)
Glucose: 89 mg/dL (ref 65–99)
Potassium: 4.5 mmol/L (ref 3.5–5.2)
Sodium: 143 mmol/L (ref 134–144)
Total Protein: 7.3 g/dL (ref 6.0–8.5)

## 2016-12-13 LAB — LIPID PANEL
CHOL/HDL RATIO: 4.6 ratio (ref 0.0–5.0)
Cholesterol, Total: 232 mg/dL — ABNORMAL HIGH (ref 100–199)
HDL: 50 mg/dL (ref >39)
LDL CALC: 163 mg/dL — AB (ref 0–99)
TRIGLYCERIDES: 97 mg/dL (ref 0–149)
VLDL Cholesterol Cal: 19 mg/dL (ref 5–40)

## 2016-12-13 LAB — VITAMIN D 25 HYDROXY (VIT D DEFICIENCY, FRACTURES): Vit D, 25-Hydroxy: 38.6 ng/mL (ref 30.0–100.0)

## 2016-12-13 MED ORDER — ATORVASTATIN CALCIUM 40 MG PO TABS: 40.0000 mg | ORAL_TABLET | Freq: Every day | ORAL | 3 refills | Status: DC

## 2016-12-16 DIAGNOSIS — R252 Cramp and spasm: Secondary | ICD-10-CM | POA: Insufficient documentation

## 2016-12-16 DIAGNOSIS — M25519 Pain in unspecified shoulder: Secondary | ICD-10-CM | POA: Insufficient documentation

## 2016-12-16 DIAGNOSIS — Z720 Tobacco use: Secondary | ICD-10-CM | POA: Insufficient documentation

## 2016-12-16 NOTE — Assessment & Plan Note (Signed)
-   Patient would like to quit. Interested in trying chantix. - Prescribed chantix with instructions on how to taper up.  - Patient to call for refill if tolerating 1 mg BID dosing.  - Recommended lung cancer screening with low dose CT. Placed orders.

## 2016-12-16 NOTE — Assessment & Plan Note (Signed)
Refilled prilosec.

## 2016-12-16 NOTE — Assessment & Plan Note (Signed)
-   May be restless leg syndrome as worse at night and improved with ropinirole in past - Will obtain labs to rule out vitamin D deficiency, anemia, or electrolyte abnormality as cause - Not presently on statin and said tolerated in the past - Will refill ropinirole

## 2016-12-16 NOTE — Assessment & Plan Note (Signed)
-   Suspect chronic bursitis given pain but lack of weakness - Will refer to Sports Medicine as has never had [place] of shoulder and symptoms ongoing for over 10 years

## 2016-12-25 ENCOUNTER — Encounter: Payer: Self-pay | Admitting: Internal Medicine

## 2016-12-25 NOTE — Progress Notes (Signed)
Received fax with old patient records. Immunizations not included. Problem list does include Benign Paroxsymal Vertigo as reported cause of dizzy spells. Office visit 04/20/16 noted to have bilateral nystagmus with Dix-Hallpike Maneuver and resolution with Epley and maneuver. Thought to have nerve entrapment of L shoulder.

## 2016-12-26 ENCOUNTER — Encounter: Payer: Self-pay | Admitting: Student

## 2016-12-26 ENCOUNTER — Ambulatory Visit (INDEPENDENT_AMBULATORY_CARE_PROVIDER_SITE_OTHER): Payer: BLUE CROSS/BLUE SHIELD | Admitting: Student

## 2016-12-26 DIAGNOSIS — M7582 Other shoulder lesions, left shoulder: Secondary | ICD-10-CM | POA: Diagnosis not present

## 2016-12-26 MED ORDER — METHYLPREDNISOLONE ACETATE 40 MG/ML IJ SUSP
40.0000 mg | Freq: Once | INTRAMUSCULAR | Status: AC
  Administered 2016-12-26: 40 mg via INTRA_ARTICULAR

## 2016-12-26 NOTE — Assessment & Plan Note (Signed)
Shoulder injected today and patient tolerated procedure. He will call if he is not better in 3-4 weeks and we will order x-rays 3 view of his left shoulder. And then, he will make an appointment after the x-rays to review the results.

## 2016-12-26 NOTE — Progress Notes (Signed)
  Curtis Henry - 61 y.o. male MRN [id number]  Date of birth: 02-Jun-1956  SUBJECTIVE:  Including CC & ROS.  CC: left shoulder pain  Presents with years of pain in his left shoulder. He works maintaining porta potty's. He does have a very active job. He reports that his pain is been intermittent. He has been told bursitis and given exercises in the past. These are currently not helping him. He is getting pain at night as well. He is not having specifically problems with overhead activity but he states certain movements bother it. Denies much pain down the arm but occasionally does go down his arm. Denies any numbness or tingling.   ROS: No unexpected weight loss, fever, chills, swelling, instability, muscle pain, numbness/tingling, redness, otherwise see HPI   PMHx - Updated and reviewed.  Contributory factors include: Negative PSHx - Updated and reviewed.  Contributory factors include:  Negative FHx - Updated and reviewed.  Contributory factors include:  Negative Social Hx - Updated and reviewed. Contributory factors include: Negative Medications - reviewed   DATA REVIEWED: Previous office visits, shoulder x-rays from 2005 are negative  PHYSICAL EXAM:  VS: BP:129/75  HR:64bpm  TEMP: ( )  RESP:   HT:6' (182.9 cm)   WT:130 lb (59 kg)  BMI:17.7 PHYSICAL EXAM: Gen: NAD, alert, cooperative with exam, well-appearing HEENT: clear conjunctiva,  CV:  no edema, capillary refill brisk, normal rate Resp: non-labored Skin: no rashes, normal turgor  Neuro: no gross deficits.  Psych:  alert and oriented  Shoulder: Inspection reveals no abnormalities, atrophy or asymmetry. Palpation is normal with no tenderness over AC joint or bicipital groove. ROM is full in all planes. Rotator cuff strength normal throughout. + signs of impingement with + Neer and Hawkin's tests, empty can sign. Speeds and Yergason's tests normal. No labral pathology noted with negative Obrien's, negative clunk and good  stability. Normal scapular function observed. No painful arc and no drop arm sign. No apprehension sign    ASSESSMENT & PLAN:   Rotator cuff tendinitis, left Shoulder injected today and patient tolerated procedure. He will call if he is not better in 3-4 weeks and we will order x-rays 3 view of his left shoulder. And then, he will make an appointment after the x-rays to review the results.  Procedure:  Injection of left shoulder Consent obtained and verified. Time-out conducted. Noted no overlying erythema, induration, or other signs of local infection. Skin prepped in a sterile fashion. Topical analgesic spray: Ethyl chloride. Completed without difficulty. Meds: 40 depomedrol, 3 cc 1% lidocaine Pain immediately improved suggesting accurate placement of the medication. Advised to call if fevers/chills, erythema, induration, drainage, or persistent bleeding.

## 2017-01-08 ENCOUNTER — Telehealth: Payer: Self-pay | Admitting: Internal Medicine

## 2017-01-08 DIAGNOSIS — Z720 Tobacco use: Secondary | ICD-10-CM

## 2017-01-08 NOTE — Telephone Encounter (Signed)
Pt needs a refill on chantix, pt should step down to 1 am and 1 pm. Pt uses [facility] on [address] ep

## 2017-01-09 MED ORDER — VARENICLINE TARTRATE 1 MG PO TABS: ORAL_TABLET | ORAL | 1 refills | Status: DC

## 2017-01-09 NOTE — Telephone Encounter (Signed)
Placed order as requested for 9 more weeks of chantix at 1 mg BID.

## 2017-01-10 ENCOUNTER — Telehealth: Payer: Self-pay

## 2017-01-10 NOTE — Telephone Encounter (Signed)
Pt has a rash, little spots in several places. Has tried A&D ointment and it works for a little while but rash comes back. What else should he try?  Should he come into the office? Won't be available by phone today. She will call back tomorrow a.m. to find out what the Dr. thinks. Ottis Stain, CMA

## 2017-01-10 NOTE — Telephone Encounter (Signed)
It is hard to say what will help without more information. If it is itchy, I would try hydrocortisone cream, which is over the counter. Where is the rash? If it is all over, it may help to try something by mouth like zyrtec. Please advise patient's wife of this when she calls back. If it has been there more than a couple of days, it would be best to see him in the office. Thank you.

## 2017-01-15 ENCOUNTER — Ambulatory Visit: Payer: BLUE CROSS/BLUE SHIELD | Admitting: Family Medicine

## 2017-01-16 ENCOUNTER — Ambulatory Visit (INDEPENDENT_AMBULATORY_CARE_PROVIDER_SITE_OTHER): Payer: BLUE CROSS/BLUE SHIELD | Admitting: Internal Medicine

## 2017-01-16 ENCOUNTER — Encounter: Payer: Self-pay | Admitting: Internal Medicine

## 2017-01-16 DIAGNOSIS — R21 Rash and other nonspecific skin eruption: Secondary | ICD-10-CM | POA: Diagnosis not present

## 2017-01-16 DIAGNOSIS — L281 Prurigo nodularis: Secondary | ICD-10-CM | POA: Insufficient documentation

## 2017-01-16 MED ORDER — BETAMETHASONE VALERATE 0.1 % EX OINT: 1.0000 "application " | TOPICAL_OINTMENT | Freq: Two times a day (BID) | CUTANEOUS | 0 refills | Status: DC

## 2017-01-16 MED ORDER — HYDROXYZINE HCL 10 MG PO TABS: 10.0000 mg | ORAL_TABLET | Freq: Three times a day (TID) | ORAL | 0 refills | Status: DC | PRN

## 2017-01-16 NOTE — Assessment & Plan Note (Signed)
Etiology unsure. Patient does work outside so could be environmental exposure, though lesions began on thigh and patient does not wear shorts outside. No new skin products. Will begin treatment with topical steroid.  - Betamethasone BID to affected areas  - Hydroxyzine PRN - F/u if no improvement in two weeks

## 2017-01-16 NOTE — Patient Instructions (Signed)
It was nice meeting you today Mr. Markovic!  Please begin putting the steroid cream on the areas of your body with the rash two times a day. Use this for at least two weeks, and if the rash is not getting better by then, schedule another appointment.   You can take the hydroxyzine (Atarax) tablets as needed for itching.   If you have any questions or concerns, please feel free to call the clinic.   Be well,  Dr. Avon Gully

## 2017-01-16 NOTE — Progress Notes (Signed)
   Subjective:   Patient: Curtis Henry       Birthdate: [birth year]       MRN: [id number]      HPI  Curtis Henry is a 61 y.o. male presenting for rash.   Rash Began a couple months ago, but has now spread to neck, so patient decided to be seen today. Began on L thigh, now has spread to both legs, to torso, and up to neck and R ear. Not present on hands, feet, or face. Is very itchy, which is his primary complaint. Tried OTC hydrocortisone cream with some relief. Has not tried anything else. Works outside but was not wearing shorts around the time the rash appeared on his thigh. Denies being around anyone with a similar rash. Denies new soaps, detergents, lotions, skin products. Uses only Aveeno products.   Smoking status reviewed. Patient is current every day smoker.   Review of Systems See HPI.     Objective:  Physical Exam  Constitutional: He is oriented to person, place, and time and well-developed, well-nourished, and in no distress.  HENT:  Head: Normocephalic and atraumatic.  Eyes: Conjunctivae and EOM are normal. Right eye exhibits no discharge. Left eye exhibits no discharge.  Pulmonary/Chest: Effort normal. No respiratory distress.  Neurological: He is alert and oriented to person, place, and time.  Skin:  Small flesh-colored papules present on neck, back, torso, and legs. No present on hands, feet, face. Few lesions present on L antihelix. Excoriations present primarily on back and torso.    Psychiatric: Affect and judgment normal.      Assessment & Plan:  Rash and nonspecific skin eruption Etiology unsure. Patient does work outside so could be environmental exposure, though lesions began on thigh and patient does not wear shorts outside. No new skin products. Will begin treatment with topical steroid.  - Betamethasone BID to affected areas  - Hydroxyzine PRN - F/u if no improvement in two weeks  Adin Hector, MD, MPH PGY-2 [facility] Pager  [phone]

## 2017-02-05 ENCOUNTER — Ambulatory Visit (HOSPITAL_COMMUNITY)
Admission: EM | Admit: 2017-02-05 | Discharge: 2017-02-05 | Disposition: A | Discharge status: Home / Self Care | Payer: BLUE CROSS/BLUE SHIELD | Attending: Internal Medicine | Admitting: Internal Medicine

## 2017-02-05 ENCOUNTER — Encounter (HOSPITAL_COMMUNITY): Payer: Self-pay | Admitting: Emergency Medicine

## 2017-02-05 DIAGNOSIS — K047 Periapical abscess without sinus: Secondary | ICD-10-CM | POA: Diagnosis not present

## 2017-02-05 DIAGNOSIS — K029 Dental caries, unspecified: Secondary | ICD-10-CM

## 2017-02-05 DIAGNOSIS — K0889 Other specified disorders of teeth and supporting structures: Secondary | ICD-10-CM | POA: Diagnosis not present

## 2017-02-05 MED ORDER — PENICILLIN V POTASSIUM 500 MG PO TABS: 500.0000 mg | ORAL_TABLET | Freq: Two times a day (BID) | ORAL | 0 refills | Status: DC

## 2017-02-05 MED ORDER — CEFTRIAXONE SODIUM 1 G IJ SOLR
INTRAMUSCULAR | Status: AC
  Filled 2017-02-05: qty 10

## 2017-02-05 MED ORDER — CEFTRIAXONE SODIUM 1 G IJ SOLR
1.0000 g | Freq: Once | INTRAMUSCULAR | Status: AC
Start: 2017-02-05 — End: 2017-02-05
  Administered 2017-02-05: 1 g via INTRAMUSCULAR

## 2017-02-05 MED ORDER — HYDROCODONE-ACETAMINOPHEN 5-325 MG PO TABS: 1.0000 | ORAL_TABLET | Freq: Four times a day (QID) | ORAL | 0 refills | Status: DC | PRN

## 2017-02-05 MED ORDER — LIDOCAINE HCL 2 % IJ SOLN
INTRAMUSCULAR | Status: AC
  Filled 2017-02-05: qty 20

## 2017-02-05 MED ORDER — PENICILLIN V POTASSIUM 500 MG PO TABS: 500.0000 mg | ORAL_TABLET | Freq: Two times a day (BID) | ORAL | 0 refills | Status: AC

## 2017-02-05 MED ORDER — KETOROLAC TROMETHAMINE 60 MG/2ML IM SOLN
60.0000 mg | Freq: Once | INTRAMUSCULAR | Status: AC
  Administered 2017-02-05: 60 mg via INTRAMUSCULAR

## 2017-02-05 MED ORDER — KETOROLAC TROMETHAMINE 60 MG/2ML IM SOLN
INTRAMUSCULAR | Status: AC
  Filled 2017-02-05: qty 2

## 2017-02-05 NOTE — ED Provider Notes (Signed)
[facility]    CSN: [id number] Arrival date & time: 02/05/17  1432     History   Chief Complaint Chief Complaint  Patient presents with  . Dental Pain    HPI Curtis Henry is a 61 y.o. male. He has a lot of bad teeth. He has been told will take $1700 to fix his teeth. He is saving the money. In the meantime, yesterday swelling and pain flared up around a broken off right lower jaw tooth. He is having a lot of pain. No fever, no malaise. Did call out of work today. He will follow-up with his dentist on June 20th.    HPI  Past Medical History:  Diagnosis Date  . Bursitis of left shoulder   . GERD (gastroesophageal reflux disease)   . Hyperlipidemia   . Restless leg syndrome     Patient Active Problem List   Diagnosis Date Noted  . Rash and nonspecific skin eruption 01/16/2017  . Rotator cuff tendinitis, left 12/26/2016  . Tobacco abuse 12/16/2016  . Leg cramps 12/16/2016  . Shoulder pain 12/16/2016  . HYPERCHOLESTEROLEMIA 11/14/2006  . IMPOTENCE INORGANIC 11/14/2006  . GASTROESOPHAGEAL REFLUX, NO ESOPHAGITIS 11/14/2006  . BURSITIS, SUBDELTOID 11/14/2006    Past Surgical History:  Procedure Laterality Date  . CIRCUMCISION  12/13/2003       Home Medications    Prior to Admission medications   Medication Sig Start Date End Date Taking? Authorizing Provider  atorvastatin (LIPITOR) 40 MG tablet Take 1 tablet (40 mg total) by mouth daily. 12/13/16   Rogue Bussing, MD  betamethasone valerate ointment (VALISONE) 0.1 % Apply 1 application topically 2 (two) times daily. 01/16/17   Verner Mould, MD  fish oil-omega-3 fatty acids 1000 MG capsule Take 2 g by mouth daily.    [provider]  HYDROcodone-acetaminophen (NORCO/VICODIN) 5-325 MG tablet Take 1 tablet by mouth 4 (four) times daily as needed. 02/05/17   Sherlene Shams, MD  hydrOXYzine (ATARAX/VISTARIL) 10 MG tablet Take 1 tablet (10 mg total) by mouth 3 (three) times daily  as needed. 01/16/17   Verner Mould, MD  Ibuprofen-Diphenhydramine Cit (ADVIL PM PO) Take 2 tablets by mouth daily as needed. For sleep    [provider]  meloxicam (MOBIC) 15 MG tablet Take 15 mg by mouth daily.    [provider]  Multiple Vitamin (MULTIVITAMIN) tablet Take 1 tablet by mouth daily.    [provider]  omeprazole (PRILOSEC) 20 MG capsule Take 20 mg by mouth as needed.    [provider]  penicillin v potassium (VEETID) 500 MG tablet Take 1 tablet (500 mg total) by mouth 2 (two) times daily. 02/05/17 02/19/17  Sherlene Shams, MD  rOPINIRole (REQUIP) 0.25 MG tablet Take 1 tablet (0.25 mg total) by mouth at bedtime. 12/12/16   Rogue Bussing, MD  varenicline (CHANTIX) 1 MG tablet Take 1 tablet twice daily. Continue for 11 weeks at 1 mg twice daily (9 more weeks). 01/09/17   Rogue Bussing, MD    Family History Family History  Problem Relation Age of Onset  . Esophageal cancer Brother   . Colon cancer Father 73  . Heart disease Brother   . Arthritis Unknown     Social History Social History  Substance Use Topics  . Smoking status: Current Every Day Smoker    Types: E-cigarettes  . Smokeless tobacco: Never Used     Comment: trying to quit all of it  .  Alcohol use No     Comment: occ.     Allergies   Lactose intolerance (gi)   Review of Systems Review of Systems  All other systems reviewed and are negative.    Physical Exam Triage Vital Signs ED Triage Vitals  Enc Vitals Group     BP 02/05/17 1444 (!) 156/78     Pulse Rate 02/05/17 1444 (!) 59     Resp 02/05/17 1444 16     Temp 02/05/17 1444 98.4 F (36.9 C)     Temp Source 02/05/17 1444 Oral     SpO2 02/05/17 1444 100 %     Weight --      Height --      Pain Score 02/05/17 1443 8     Pain Loc --    Updated Vital Signs BP (!) 156/78 (BP Location: Right Arm)   Pulse (!) 59   Temp 98.4 F (36.9 C) (Oral)   Resp 16   SpO2 100%    Physical Exam  Constitutional: He is oriented to person, place, and time. No distress.  Alert, nicely groomed  HENT:  Head: Atraumatic.  Mild trismus Swelling of the right lower cheek over the right lower jaw evident Tender swelling of the gum, pointing, around the right lower first molar, which is broken off and carious  Eyes:  Conjugate gaze, no eye redness/drainage  Neck: Neck supple.  Cardiovascular: Normal rate.   Pulmonary/Chest: No respiratory distress.  Abdominal: He exhibits no distension.  Musculoskeletal: Normal range of motion.  Neurological: He is alert and oriented to person, place, and time.  Skin: Skin is warm and dry.  No cyanosis  Nursing note and vitals reviewed.    UC Treatments / Results   Procedures Procedures (including critical care time)  Medications Ordered in UC Medications  cefTRIAXone (ROCEPHIN) injection 1 g (1 g Intramuscular Given 02/05/17 1534)  ketorolac (TORADOL) injection 60 mg (60 mg Intramuscular Given 02/05/17 1537)     Final Clinical Impressions(s) / UC Diagnoses   Final diagnoses:  Dental abscess  Dental caries   Followup as planned with a dentist in the next couple weeks to start on plan of care for teeth.  Injection of rocephin (antibiotic) and ketorolac (anti inflammatory/pain reliever) given at the urgent care today.  Prescription for penicillin V was sent to the pharmacy.  Prescription for a small number of vicodin was printed, to use sparingly for severest pain.  Ok to take ibuprofen or aleve with vicodin.    Meds ordered this encounter  Medications  . penicillin v potassium (VEETID) 500 MG tablet    Sig: Take 1 tablet (500 mg total) by mouth 2 (two) times daily.    Dispense:  28 tablet    Refill:  0  . HYDROcodone-acetaminophen (NORCO/VICODIN) 5-325 MG tablet    Sig: Take 1 tablet by mouth 4 (four) times daily as needed.    Dispense:  15 tablet    Refill:  0      Sherlene Shams, MD 02/05/17 2345

## 2017-02-05 NOTE — ED Triage Notes (Signed)
The patient presented to the [facility] with a complaint of dental pain and swelling that started yesterday.

## 2017-02-05 NOTE — Discharge Instructions (Addendum)
Followup as planned with a dentist in the next couple weeks to start on plan of care for teeth.  Injection of rocephin (antibiotic) and ketorolac (anti inflammatory/pain reliever) given at the urgent care today.  Prescription for penicillin V was sent to the pharmacy.  Prescription for a small number of vicodin was printed, to use sparingly for severest pain.  Ok to take ibuprofen or aleve with vicodin.

## 2017-02-26 ENCOUNTER — Other Ambulatory Visit: Payer: Self-pay | Admitting: Internal Medicine

## 2017-02-26 NOTE — Telephone Encounter (Signed)
Needs refill on itching pills and cream. Has appt July 2.  [facility] [address]

## 2017-02-27 MED ORDER — BETAMETHASONE VALERATE 0.1 % EX OINT: 1.0000 "application " | TOPICAL_OINTMENT | Freq: Two times a day (BID) | CUTANEOUS | 0 refills | Status: DC

## 2017-02-27 MED ORDER — HYDROXYZINE HCL 10 MG PO TABS: 10.0000 mg | ORAL_TABLET | Freq: Three times a day (TID) | ORAL | 1 refills | Status: DC | PRN

## 2017-03-18 ENCOUNTER — Ambulatory Visit (INDEPENDENT_AMBULATORY_CARE_PROVIDER_SITE_OTHER): Payer: BLUE CROSS/BLUE SHIELD | Admitting: Internal Medicine

## 2017-03-18 VITALS — BP 136/86 | HR 44 | Temp 98.3°F | Ht 72.0 in | Wt 126.0 lb

## 2017-03-18 DIAGNOSIS — Z113 Encounter for screening for infections with a predominantly sexual mode of transmission: Secondary | ICD-10-CM

## 2017-03-18 DIAGNOSIS — Z720 Tobacco use: Secondary | ICD-10-CM | POA: Diagnosis not present

## 2017-03-18 DIAGNOSIS — Z1159 Encounter for screening for other viral diseases: Secondary | ICD-10-CM

## 2017-03-18 DIAGNOSIS — K089 Disorder of teeth and supporting structures, unspecified: Secondary | ICD-10-CM

## 2017-03-18 DIAGNOSIS — R252 Cramp and spasm: Secondary | ICD-10-CM

## 2017-03-18 MED ORDER — GABAPENTIN 300 MG PO CAPS: 300.0000 mg | ORAL_CAPSULE | Freq: Every day | ORAL | 0 refills | Status: DC

## 2017-03-18 MED ORDER — HYDROXYZINE HCL 10 MG PO TABS: 10.0000 mg | ORAL_TABLET | Freq: Three times a day (TID) | ORAL | 0 refills | Status: DC | PRN

## 2017-03-18 NOTE — Patient Instructions (Signed)
Mr. Brissett,  Please stop requip and start gabapentin 300 mg at bedtime for restless leg syndrome.  Consider trying nicotine lozenges for smoking cessation. Good work cutting back!  Best, Dr. Ola Spurr

## 2017-03-19 DIAGNOSIS — K089 Disorder of teeth and supporting structures, unspecified: Secondary | ICD-10-CM | POA: Insufficient documentation

## 2017-03-19 LAB — HEPATITIS C ANTIBODY: Hep C Virus Ab: <0.1 s/co ratio (ref 0.0–0.9)

## 2017-03-19 LAB — HIV ANTIBODY (ROUTINE TESTING W REFLEX): HIV Screen 4th Generation wRfx: NONREACTIVE

## 2017-03-19 NOTE — Progress Notes (Signed)
[facility] Progress Note  Subjective:  Curtis Henry is a 61 y.o. male with history of GERD, rotator cuff tendonitis, HLD, tobacco abuse and leg cramps who presents for ongoing leg cramps.  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 - Has been making a conscious effort to stay better hydrated and will take pedialyte and water ROS: No fevers, no falls or injuries, no weakness of numbness of extremities  #Poor dentition/low weight: - Was seen in ED 5/22 for dental pain and given penicillin and vicodin - Cannot afford paying dentist outright at this time - Thinks may have some impact on his appetite  #Tobacco abuse - Ran out of chantix a couple weeks ago - Thinks chantix only helped by making tobacco not taste as good - Has cut back to 1 cigarette per day, down from 3-4 (Black and Milds) - Did not get a call about scheduling low dose CT for lung cancer screening after visit in March  Chief Complaint  Patient presents with  . Leg Pain    Objective: Blood pressure 136/86, pulse (!) 44, temperature 98.3 F (36.8 C), temperature source Oral, height 6' (1.829 m), weight 126 lb (57.2 kg). Body mass index is 17.09 kg/m. Constitutional: Thin male, in NAD; very pleasant HENT: Poor dentition, missing teeth, multiple caries Cardiovascular: Palpable pedal pulses.  Musculoskeletal: No LE swelling. Lumbar paraspinal muscles TTP. No midline spinal tenderness to palpation.  Neurological: AOx3, no focal deficits. Normal gait. +2 patellar reflexes bilaterally.  Skin: Skin is warm and dry. No rash noted.   Psychiatric: Normal mood and affect.  Vitals reviewed  BMP Latest Ref Rng & Units 12/12/2016  Glucose 65 - 99 mg/dL 89  BUN 8 - 27 mg/dL 4(L)  Creatinine 0.76 - 1.27 mg/dL 0.96  BUN/Creat Ratio 10 - 24 4(L)  Sodium 134 - 144 mmol/L 143  Potassium 3.5 - 5.2 mmol/L 4.5  Chloride 96 - 106 mmol/L 101  CO2 18 - 29 mmol/L 28  Calcium 8.6 - 10.2 mg/dL 9.7    Assessment/Plan: Leg cramps - Recommended stopping ropinirole and retrying gabapentin 300 mg QHS for suspected restless leg syndrome, increasing as needed - Electrolytes were normal back in March - Patient already getting regular exercise with his work  Tobacco abuse - Encouraged patient to continue efforts to cut back on cigarettes and ultimately quit - Suggested trying nicotine lozenge, as hard candy has been helpful to him as substitute for cigarette  Poor dentition - Provided list of area dentists who accept self-pay with possibility of payment plan  Health Maintenance: Ordered hep C and HIV screening  Follow-up prn.  [name], MD [place], PGY-3

## 2017-03-19 NOTE — Assessment & Plan Note (Signed)
-   Provided list of area dentists who accept self-pay with possibility of payment plan

## 2017-03-19 NOTE — Assessment & Plan Note (Signed)
-   Encouraged patient to continue efforts to cut back on cigarettes and ultimately quit - Suggested trying nicotine lozenge, as hard candy has been helpful to him as substitute for cigarette

## 2017-03-19 NOTE — Assessment & Plan Note (Signed)
-   Recommended stopping ropinirole and retrying gabapentin 300 mg QHS for suspected restless leg syndrome, increasing as needed - Electrolytes were normal back in March - Patient already getting regular exercise with his work

## 2017-04-01 ENCOUNTER — Telehealth: Payer: Self-pay | Admitting: *Deleted

## 2017-04-01 NOTE — Telephone Encounter (Signed)
Called patient, no answer, no voicemail. Appointment scheduled for 7/26 at 3pm at [facility] (radiology dept). Will try to call patient again later.

## 2017-04-01 NOTE — Telephone Encounter (Signed)
-----   Message from [facility], MD sent at 03/19/2017 10:50 PM EDT ----- Regarding: low dose CT Hello,  I ordered this patient a low-dose CT to screen for lung cancer back in March, but he couldn't stay at that time to wait for it to be scheduled. He said he never got a call about getting this done. Please help him arrange an appointment.  Thank you, [facility]

## 2017-04-04 NOTE — Telephone Encounter (Signed)
Patient called back, appointment information given for CT scan 04/11/17 at 3 PM [facility] Radiology Dept.  Derl Barrow, RN

## 2017-04-04 NOTE — Telephone Encounter (Signed)
Called again, no answer, no voicemail.

## 2017-04-11 ENCOUNTER — Ambulatory Visit (HOSPITAL_COMMUNITY): Payer: BLUE CROSS/BLUE SHIELD | Attending: Family Medicine

## 2017-04-17 ENCOUNTER — Other Ambulatory Visit: Payer: Self-pay | Admitting: Internal Medicine

## 2017-04-21 ENCOUNTER — Other Ambulatory Visit: Payer: Self-pay | Admitting: Internal Medicine

## 2017-05-27 ENCOUNTER — Ambulatory Visit (INDEPENDENT_AMBULATORY_CARE_PROVIDER_SITE_OTHER): Payer: BLUE CROSS/BLUE SHIELD | Admitting: Internal Medicine

## 2017-05-27 ENCOUNTER — Encounter: Payer: Self-pay | Admitting: Internal Medicine

## 2017-05-27 VITALS — BP 126/72 | HR 78 | Temp 98.4°F | Wt 129.0 lb

## 2017-05-27 DIAGNOSIS — R252 Cramp and spasm: Secondary | ICD-10-CM

## 2017-05-27 DIAGNOSIS — Z720 Tobacco use: Secondary | ICD-10-CM | POA: Diagnosis not present

## 2017-05-27 MED ORDER — VARENICLINE TARTRATE 1 MG PO TABS: ORAL_TABLET | ORAL | 2 refills | Status: DC

## 2017-05-27 MED ORDER — GABAPENTIN 600 MG PO TABS: 600.0000 mg | ORAL_TABLET | Freq: Every day | ORAL | 1 refills | Status: DC

## 2017-05-27 NOTE — Patient Instructions (Addendum)
Curtis Henry,  Restart chantix for smoking cessation. Start with half tablets the first day you restart and take with food. If you tolerate it well, resume regular full tablet twice daily.  Try gabapentin 600 mg nightly for your leg pains. The other part of the workup would be to check you iron stores. Please call our office for a lab appointment at your earliest convenience or these can be done at a follow-up visit in the next couple of months.  We'll call you to reschedule the CT lung scan.  Best, Dr. Ola Spurr

## 2017-05-29 NOTE — Assessment & Plan Note (Addendum)
-   Restart chantix. Discussed whether or not patient would need to restart treatment with lower dose with Dr. Valentina Lucks, as patient had gotten up to maintenance dose of 1 mg BID. Dr. Valentina Lucks suggested trying half dose the first day and being sure to take with food to test tolerability.  - Will reschedule CT lung cancer screening.

## 2017-05-29 NOTE — Progress Notes (Signed)
[facility] Progress Note  Subjective:  Curtis Henry is a 61 y.o. male with history of HLD, restless leg syndrome, GERD, shoulder bursitis and tobacco abuse who presents for continued leg pains.   #Leg pains: - Ongoing for the last few years. Describes as cramps. Also has numbness and tingling in his feet. - Did not feel ropinirole helped in the past.  - Tried gabapentin 300 mg QHS with some improvement. Could sleep soundly from 9 or 10 pm until 2 or 3 pm but then would awaken from legs jumping. He sometimes took another 300 mg but this made it hard to wake up in the morning.  ROS: No falls, no weakness, no dark stools  #Tobacco abuse: - Reports good response to chantix. Able to cut back to 1 cigar daily from 5.  - Ran out of chantix but would like to restart. Tolerated well and denies nightmares.  - Was out of town when low-dose CT lung cancer screening was scheduled so not performed.   Allergies  Allergen Reactions  . Lactose Intolerance (Gi) Other (See Comments)    Unsettled bowel.     Objective: Blood pressure 126/72, pulse 78, temperature 98.4 F (36.9 C), temperature source Oral, weight 129 lb (58.5 kg), SpO2 98 %. Constitutional: Thin male in NAD, very pleasant Cardiovascular: RRR, S1, S2, no m/r/g. Palpable PT and DP pulses.  Musculoskeletal: No LE edema or pain with palpation of lower legs or thighs.  Neurological: AOx3, decreased sensation over L lateral dorsum of foot with monofilament testing. Symmetric patellar reflexes.  Skin: Hyperpigmented areas of scarring over shins.  Psychiatric: Normal mood and affect.  Vitals reviewed  Assessment/Plan: Leg cramps - Suspect restless leg syndrome but also has symptoms consistent with peripheral neuropathy. Has not had iron studies but had normal MCV and hgb in March. - Lab closed for today. Will plan on checking iron level and ferritin at next visit. - Increase gabapentin to 600 mg QHS.   Tobacco abuse -  Restart chantix. Discussed whether or not patient would need to restart treatment with lower dose with Dr. Valentina Lucks, as patient had gotten up to maintenance dose of 1 mg BID. Dr. Valentina Lucks suggested trying half dose the first day and being sure to take with food to test tolerability.  - Will reschedule CT lung cancer screening.  Follow-up in 1-2 months to see how gabapentin is working.  [name], MD [place], PGY-3

## 2017-05-29 NOTE — Assessment & Plan Note (Signed)
-   Suspect restless leg syndrome but also has symptoms consistent with peripheral neuropathy. Has not had iron studies but had normal MCV and hgb in March. - Lab closed for today. Will plan on checking iron level and ferritin at next visit. - Increase gabapentin to 600 mg QHS.

## 2017-05-30 ENCOUNTER — Telehealth: Payer: Self-pay | Admitting: *Deleted

## 2017-05-30 NOTE — Telephone Encounter (Signed)
Called patient and provided him with scheduling phone number. He expressed understanding.

## 2017-05-30 NOTE — Telephone Encounter (Signed)
Pt can reschedule himself. [phone] is the number. Deseree Kennon Holter, CMA

## 2017-05-30 NOTE — Telephone Encounter (Signed)
-----   Message from Rogue Bussing, MD sent at 05/29/2017  8:25 PM EDT ----- Regarding: Low-dose CT rescheduling Contact: [phone] Hello!  This patient missed his appointment for low-dose CT lung cancer screening. Could you help him reschedule? Or can he call the imaging center himself?  Thank you, [facility]

## 2017-07-19 ENCOUNTER — Ambulatory Visit (HOSPITAL_COMMUNITY)
Admission: RE | Admit: 2017-07-19 | Discharge: 2017-07-19 | Disposition: A | Discharge status: Home / Self Care | Payer: BLUE CROSS/BLUE SHIELD | Source: Ambulatory Visit | Attending: Family Medicine | Admitting: Family Medicine

## 2017-07-19 DIAGNOSIS — F17210 Nicotine dependence, cigarettes, uncomplicated: Secondary | ICD-10-CM | POA: Insufficient documentation

## 2017-07-19 DIAGNOSIS — J439 Emphysema, unspecified: Secondary | ICD-10-CM | POA: Diagnosis not present

## 2017-07-19 DIAGNOSIS — Z122 Encounter for screening for malignant neoplasm of respiratory organs: Secondary | ICD-10-CM | POA: Diagnosis not present

## 2017-07-19 DIAGNOSIS — I700 Atherosclerosis of aorta: Secondary | ICD-10-CM | POA: Diagnosis not present

## 2017-07-22 ENCOUNTER — Telehealth: Payer: Self-pay | Admitting: *Deleted

## 2017-07-22 DIAGNOSIS — R918 Other nonspecific abnormal finding of lung field: Secondary | ICD-10-CM

## 2017-07-22 NOTE — Telephone Encounter (Signed)
[facility] calling to give call report on Chest CT done on 11/2 (Please see report under imaging tab). Message routed to PCP.

## 2017-07-25 NOTE — Telephone Encounter (Signed)
Scheduled pt appt with dr Vickii Chafe, CMA

## 2017-07-25 NOTE — Telephone Encounter (Signed)
Placed order for repeat CT in 3 months. Informed patient of results and need to follow-up. He expressed understanding and says he has had a lot of mucous. Recommended follow-up with Dr. Valentina Lucks for PFTs.  Please assist patient with scheduling.   [name], MD [place], PGY-3

## 2017-07-28 ENCOUNTER — Other Ambulatory Visit: Payer: Self-pay | Admitting: Internal Medicine

## 2017-08-01 ENCOUNTER — Ambulatory Visit (INDEPENDENT_AMBULATORY_CARE_PROVIDER_SITE_OTHER): Payer: BLUE CROSS/BLUE SHIELD | Admitting: Pharmacist

## 2017-08-01 ENCOUNTER — Encounter: Payer: Self-pay | Admitting: Pharmacist

## 2017-08-01 DIAGNOSIS — Z720 Tobacco use: Secondary | ICD-10-CM

## 2017-08-01 MED ORDER — BETAMETHASONE VALERATE 0.1 % EX OINT: 1.0000 "application " | TOPICAL_OINTMENT | Freq: Two times a day (BID) | CUTANEOUS | 1 refills | Status: DC

## 2017-08-01 MED ORDER — UMECLIDINIUM BROMIDE 62.5 MCG/INH IN AEPB: 1.0000 | INHALATION_SPRAY | Freq: Every day | RESPIRATORY_TRACT | 0 refills | Status: DC

## 2017-08-01 MED ORDER — GABAPENTIN 600 MG PO TABS: 600.0000 mg | ORAL_TABLET | Freq: Every day | ORAL | 4 refills | Status: DC

## 2017-08-01 MED ORDER — UMECLIDINIUM BROMIDE 62.5 MCG/INH IN AEPB: 1.0000 | INHALATION_SPRAY | Freq: Every day | RESPIRATORY_TRACT | 6 refills | Status: DC

## 2017-08-01 NOTE — Patient Instructions (Addendum)
Thanks for coming in to see [place] today for your lung function tests!  1. We started an inhaler to help with your lungs - a prescription for Incruse Ellipta was sent to your pharmacy. We also gave you a sample that will last you a week until you're able to pick up your prescription from the pharmacy. Use this inhaler once a day, every day.  2. We sent new prescriptions for your gabapentin and betamethasone ointment to your pharmacy.  3. Work on quitting smoking - as you know, this is the best thing to help your lungs. Call us or come see [place] if you need any help with quitting smoking...we have options! If you're still smoking by the first of the next year, come see [place] again in the pharmacy clinic.  Follow-up with Dr. Ola Spurr as needed, in a month or two.

## 2017-08-01 NOTE — Assessment & Plan Note (Signed)
Spirometry evaluation reveals moderate obstructive lung disease corresponding to GOLD Classification B based on spirometry Stage 2, zero exacerbations in the past year, and CAT score of 18. Start Incruse Ellipta 62.5mg  1 inhalation once daily. Educated patient on purpose, proper use, potential adverse effects including dry mouth.  Reviewed results of pulmonary function tests.  Pt verbalized understanding of results and education.  Written pt instructions provided.  F/U with PCP as needed. Instructed patient to return to the pharmacy clinic sooner if he would like more assistance with smoking cessation.

## 2017-08-01 NOTE — Progress Notes (Signed)
   S:    Patient arrives in good spirits, ambulating without assistance. Presents for lung function evaluation. Patient was referred on 07/25/2017. Patient was last seen by Primary Care Provider on 05/27/2017. Patient had a CT chest on 07/19/2017 which showed bilateral pulmonary nodules. Patient reports breathing has been somewhat impaired. He has a fairly constant cough and feels as though his chest is always full of mucus, but does not report issues with breathlessness. He has no problem breathing when walking his dog or playing with his grandchild. He used to smoke 1 ppd of cigarettes, but is now only smoking 1-2 cigars per day. He has tried to quit in the past, but then experiences a stressful event which triggers him to start smoking again. He briefly took Chantix for ~1 week, however his wife made him stop taking it because she thought he was "getting mean." He expresses motivation to quit smoking and understands that it is important to his health. Patient is not currently taking any medications for COPD or asthma. His family history is significant for asthma - he has a brother, sister, and grandmother who have diagnosed asthma. His brother also smokes, but does not have asthma.  O: mMRC score < 2 CAT score= 18 See Documentation Flowsheet - CAT/COPD for complete symptom scoring.  See "scanned report" or Documentation Flowsheet (discrete results - PFTs) for  Spirometry results. Patient provided good effort while attempting spirometry.   Lung Age = 89 Albuterol Neb  Lot# [id number]     Exp. 01/2019  A/P: Spirometry evaluation reveals moderate obstructive lung disease corresponding to GOLD Classification B based on spirometry Stage 2, zero exacerbations in the past year, and CAT score of 18. Start Incruse Ellipta 62.5mg  1 inhalation once daily. Educated patient on purpose, proper use, potential adverse effects including dry mouth.  Reviewed results of pulmonary function tests.  Pt verbalized  understanding of results and education.  Written pt instructions provided.  F/U with PCP as needed. Instructed patient to return to the pharmacy clinic sooner if he would like more assistance with smoking cessation. Total time in face to face counseling 30 minutes.  Patient seen with Drusilla Kanner, PharmD Candidate and Leeroy Cha, PharmD PGY-1 Resident.

## 2017-08-28 ENCOUNTER — Encounter: Payer: Self-pay | Admitting: Internal Medicine

## 2017-11-03 ENCOUNTER — Other Ambulatory Visit: Payer: Self-pay | Admitting: Family Medicine

## 2017-11-28 MED ORDER — UMECLIDINIUM BROMIDE 62.5 MCG/INH IN AEPB: 1.0000 | INHALATION_SPRAY | Freq: Every day | RESPIRATORY_TRACT | 0 refills | Status: DC

## 2017-12-03 ENCOUNTER — Telehealth: Payer: Self-pay

## 2017-12-03 ENCOUNTER — Other Ambulatory Visit: Payer: Self-pay | Admitting: Family Medicine

## 2017-12-03 ENCOUNTER — Telehealth: Payer: Self-pay | Admitting: Acute Care

## 2017-12-03 DIAGNOSIS — R918 Other nonspecific abnormal finding of lung field: Secondary | ICD-10-CM

## 2017-12-03 NOTE — Progress Notes (Unsigned)
CT Chest order placed for follow up evaluation for nodule. Red team, please call patient and assist him in scheduling this as soon as possible. Thank you.   Smitty Cords, MD [place], PGY-3

## 2017-12-03 NOTE — Telephone Encounter (Signed)
I was notified by Dr. Weber Cooks that this patient , who had a Lung RADS 4 A : (suspicious findings, either short term follow up in 3 months or alternatively  PET Scan evaluation may be considered when there is a solid component of  8 mm or larger.) LDCT result after an 11.2.2018 screening had not had his 3 month follow up scan. This patient was independently scanned by Dr. Gwendlyn Deutscher, and was not screened through the [facility]. I have called the [facility]  as a reminder to get the patient  In for his follow up screening to ensure the appropriate follow up and plan of care is initiated . I have left my contact information in the voice mail.

## 2017-12-03 NOTE — Telephone Encounter (Signed)
Covering inbox for Dr. Ola Spurr:   Per epic review in November Dr. Ola Spurr called the patient and discussed CT scan results and need for follow-up in 3 months.  The order was placed for the CT scan, unsure why it was not performed.  In any case will place the order again.   Smitty Cords, MD [place], PGY-3

## 2017-12-03 NOTE — Telephone Encounter (Signed)
Judson Roch- NP with [facility] calling regarding patient. States patient was to have a 3 month follow up chest CT. Order was started but not completed. Judson Roch states we need to be following up with patient regarding previous CT chest and area of concern. Her call back [phone] Wallace Cullens, RN

## 2017-12-09 NOTE — Telephone Encounter (Signed)
Reviewed chart.  Repeat CT chest has been ordered by Dr. Juanito Doom.

## 2017-12-09 NOTE — Telephone Encounter (Signed)
Repeat CT both ordered and scheduled

## 2017-12-10 ENCOUNTER — Ambulatory Visit (HOSPITAL_COMMUNITY)
Admission: RE | Admit: 2017-12-10 | Discharge: 2017-12-10 | Disposition: A | Discharge status: Home / Self Care | Payer: BLUE CROSS/BLUE SHIELD | Source: Ambulatory Visit | Attending: Family Medicine | Admitting: Family Medicine

## 2017-12-10 DIAGNOSIS — J439 Emphysema, unspecified: Secondary | ICD-10-CM | POA: Diagnosis not present

## 2017-12-10 DIAGNOSIS — R918 Other nonspecific abnormal finding of lung field: Secondary | ICD-10-CM | POA: Diagnosis not present

## 2017-12-10 DIAGNOSIS — I700 Atherosclerosis of aorta: Secondary | ICD-10-CM | POA: Diagnosis not present

## 2017-12-10 DIAGNOSIS — R911 Solitary pulmonary nodule: Secondary | ICD-10-CM | POA: Diagnosis not present

## 2017-12-11 ENCOUNTER — Telehealth: Payer: Self-pay

## 2017-12-11 ENCOUNTER — Encounter: Payer: Self-pay | Admitting: Internal Medicine

## 2017-12-11 DIAGNOSIS — R911 Solitary pulmonary nodule: Secondary | ICD-10-CM

## 2017-12-11 NOTE — Telephone Encounter (Signed)
Ct ordered by Dr. Juanito Doom  But Dr. Ola Spurr is PCP. I will discuss with either Dr. Ola Spurr or if I cannot get in touch with her I will call patient Curtis Henry

## 2017-12-11 NOTE — Telephone Encounter (Signed)
Attempted to reach patient to let him know CT scan results of enlarging LLL nodule and new LLL nodule. Home phone number without option to leave message. Alternate number reached work Advertising account executive. Left voicemail on wife's cell phone with whom we are authorized to discuss medical information. I explained that there was some growth of previously noted lesion that requires follow-up with specialist to rule-out cancer. I explained that they will receive phone call about referral to Thoracic Surgery. Asked to please call back with questions. I will send a message through mychart, as well. I placed ambulatory referral to cardiothoracic surgery.  [name], MD [place], PGY-3

## 2017-12-11 NOTE — Telephone Encounter (Signed)
[facility] radiology calling report on patient. Report is in EMR. Will forward to MD. Wallace Cullens, RN  IMPRESSION: 1. Enlarging spiculated left lower lobe nodule. Lesion remains highly worrisome for primary bronchogenic carcinoma and is categorized as Lung-RADS 4B. Consultation with Pulmonology or Thoracic Surgery recommended. These results will be called to the ordering clinician or representative by the Radiologist Assistant, and communication documented in the PACS or zVision Dashboard. 2. New 6.2 mm left lower lobe nodule, also considered suspicious. Continued attention on followup exams is warranted. 3.  Aortic atherosclerosis (ICD10-170.0). 4.  Emphysema (ICD10-J43.9

## 2017-12-16 ENCOUNTER — Other Ambulatory Visit: Payer: Self-pay | Admitting: Internal Medicine

## 2017-12-16 MED ORDER — GABAPENTIN 600 MG PO TABS: 600.0000 mg | ORAL_TABLET | Freq: Every day | ORAL | 1 refills | Status: DC

## 2017-12-16 NOTE — Telephone Encounter (Signed)
Patient wife, Gaspar Cola, calling back regarding results. Please call her at [phone]. Danley Danker, RN [facility] RN)

## 2017-12-16 NOTE — Telephone Encounter (Signed)
Reviewed results with patient's wife. She requests trying to reach him tomorrow between 12 pm and 1 pm on his cell phone. Also made appointment on 4/4 to discuss in person, per her request. She will call to cancel if not needed. She also says he needs refill of gabapentin. This was ordered.

## 2017-12-17 NOTE — Telephone Encounter (Signed)
Spoke with patient around 12:10 pm today regarding results. He expressed understanding and is awaiting referral. Will discuss in person on Thursday, as well.

## 2017-12-19 ENCOUNTER — Encounter: Payer: Self-pay | Admitting: Internal Medicine

## 2017-12-19 ENCOUNTER — Ambulatory Visit (INDEPENDENT_AMBULATORY_CARE_PROVIDER_SITE_OTHER): Payer: BLUE CROSS/BLUE SHIELD | Admitting: Internal Medicine

## 2017-12-19 VITALS — BP 120/80 | HR 74 | Temp 97.6°F | Ht 72.0 in | Wt 124.4 lb

## 2017-12-19 DIAGNOSIS — J449 Chronic obstructive pulmonary disease, unspecified: Secondary | ICD-10-CM

## 2017-12-19 DIAGNOSIS — L84 Corns and callosities: Secondary | ICD-10-CM

## 2017-12-19 DIAGNOSIS — Z720 Tobacco use: Secondary | ICD-10-CM | POA: Diagnosis not present

## 2017-12-19 DIAGNOSIS — R918 Other nonspecific abnormal finding of lung field: Secondary | ICD-10-CM | POA: Diagnosis not present

## 2017-12-19 MED ORDER — GABAPENTIN 600 MG PO TABS: 600.0000 mg | ORAL_TABLET | Freq: Every day | ORAL | 5 refills | Status: DC

## 2017-12-19 MED ORDER — UMECLIDINIUM BROMIDE 62.5 MCG/INH IN AEPB: 1.0000 | INHALATION_SPRAY | Freq: Every day | RESPIRATORY_TRACT | 11 refills | Status: DC

## 2017-12-19 NOTE — Patient Instructions (Signed)
Mr. Curtis Henry,  You have been referred to [facility]. Their number is [phone].  I reordered you inhaler and gabapentin.  Try to increase protein in your diet--drinking a supplement like boost may help.  Take care! I will follow along with your results!  Best, Dr. Ola Spurr

## 2017-12-20 ENCOUNTER — Encounter: Payer: Self-pay | Admitting: Internal Medicine

## 2017-12-20 DIAGNOSIS — R918 Other nonspecific abnormal finding of lung field: Secondary | ICD-10-CM | POA: Insufficient documentation

## 2017-12-20 DIAGNOSIS — J449 Chronic obstructive pulmonary disease, unspecified: Secondary | ICD-10-CM | POA: Insufficient documentation

## 2017-12-20 DIAGNOSIS — L84 Corns and callosities: Secondary | ICD-10-CM | POA: Insufficient documentation

## 2017-12-20 NOTE — Assessment & Plan Note (Signed)
-   Painful left heel callus. Referred to podiatry for treatment. - Suggested soft heel cup inset/insole and trying to pad the area with moleskin.

## 2017-12-20 NOTE — Progress Notes (Signed)
[facility] Progress Note  Subjective:  Curtis Henry is a 62 y.o. male with COPD, history of tobacco abuse, poor dentition limiting PO intake, HLD, and restless leg syndrome who presents for discussion of recent lung imaging. Patient had abnormal lung cancer screening with low-dose CT last November. Follow-up imaging 12/11/17 showed increase in size of LLL spiculated nodule from 8.3 mm to 10 mm and new 6.2 LLL nodule. Had discussed results and need for specialist referral for further work-up with patient and his wife over the phone, and they both preferred to review images together today. Patient reports he has been feeling well. He has cut back on his smoking to about 1 black and mild cigar from 5 a day previously. He is using a juul e-cigarette as a substitute. He says he has had much improvement in his breathing since cutting back. He has run out of the incruse inhaler. He and his wife say he is still "running circles around" the younger guys at work and has a lot of energy. He has not yet been able to get in with an affordable dentist to have teeth extractions, which limits his PO intake though he reports good appetite. He is worried about the CT results because he has seen his wife battle cancer and has had a brother who died from lung cancer and had to have a tracheostomy tube. ROS: No n/v/d. No increased SOB.   In addition, patient complaining of left foot pain for several months. He thinks it is due to a painful callus and wearing heavy work boots.   Family History  Problem Relation Age of Onset  . Esophageal cancer Brother   . Colon cancer Father 55  . Heart disease Brother   . Arthritis Unknown   He also reports a brother who died of lung cancer in his 57s.     Allergies  Allergen Reactions  . Lactose Intolerance (Gi) Other (See Comments)    Unsettled bowel.     Objective: Blood pressure 120/80, pulse 74, temperature 97.6 F (36.4 C), temperature source Oral,  height 6' (1.829 m), weight 124 lb 6.4 oz (56.4 kg), SpO2 98 %. Body mass index is 16.87 kg/m. Weight stable. Constitutional: Very thin male in NAD, pleasant HENT: Poor dentition Cardiovascular: RRR, S1, S2, no m/r/g.  Pulmonary/Chest: Effort normal and breath sounds normal.  Lymph: No axillary lymphadenopathy.  MSK: ~1.5 cm x 1 cm callus over medial calcaneous of left plantar surface that is TTP. No TTP over midfoot.  Psychiatric: Normal mood and affect.  Vitals reviewed  Assessment/Plan: Abnormal CT lung screening - Discussed results with patient and his wife. Largest LLL nodule classified as Lung-RADS 4B, >15% risk of cancer. Had already placed cardiothoracic surgery consult last week but provided contact number for [facility], as family had not yet heard about appointment.  - Recommended continuing to work towards tobacco cessation and maintaining good nutrition (consider protein shakes).   Heel callus - Painful left heel callus. Referred to podiatry for treatment. - Suggested soft heel cup inset/insole and trying to pad the area with moleskin.   Refilled incruse inhaler and gabapentin for RLS.   Follow-up prn.  [name], MD [place], PGY-3

## 2017-12-20 NOTE — Assessment & Plan Note (Signed)
-   Discussed results with patient and his wife. Largest LLL nodule classified as Lung-RADS 4B, >15% risk of cancer. Had already placed cardiothoracic surgery consult last week but provided contact number for [facility], as family had not yet heard about appointment.  - Recommended continuing to work towards tobacco cessation and maintaining good nutrition (consider protein shakes).

## 2017-12-31 ENCOUNTER — Other Ambulatory Visit: Payer: Self-pay | Admitting: *Deleted

## 2017-12-31 ENCOUNTER — Institutional Professional Consult (permissible substitution): Payer: BLUE CROSS/BLUE SHIELD | Admitting: Cardiothoracic Surgery

## 2017-12-31 ENCOUNTER — Other Ambulatory Visit: Payer: Self-pay

## 2017-12-31 ENCOUNTER — Encounter: Payer: Self-pay | Admitting: Cardiothoracic Surgery

## 2017-12-31 VITALS — BP 136/90 | HR 80 | Resp 16 | Ht 72.0 in | Wt 124.0 lb

## 2017-12-31 DIAGNOSIS — R918 Other nonspecific abnormal finding of lung field: Secondary | ICD-10-CM

## 2017-12-31 DIAGNOSIS — R911 Solitary pulmonary nodule: Secondary | ICD-10-CM

## 2017-12-31 NOTE — Progress Notes (Addendum)
PCP is Rogue Bussing, MD Referring Provider is Larey Seat*  Chief Complaint  Patient presents with  . Lung Lesion    per LO DOSE CT CHEST...LLLobe/RLLobe Endobronchial..Marland KitchenSpirometry 08/01/17  Patient examined, CT scans of the chest performed in November 2018 in March 2019 personally reviewed and counseled with patient HPI: 62 year old AA male ex-smoker who stopped smoking 3 months ago presents for evaluation of recently diagnosed left lower lobe pulmonary nodules.  A screening CT scan of the chest November 2018 demonstrated a 8 mm left lower lobe nodule.  The patient has a 30-pack-year history of smoking.  He returns for follow-up CT scan March 2019 which showed this nodule now to be spiculated and increased in size to 10 mm, very suspicious for bronchogenic carcinoma.  There is also a new 6 mm second left lower lobe nodule measuring approximately 6 mm new since last scan.  There appears to be no abnormal mediastinal adenopathy.  No suspicious nodules in the right lung.  Generalized mild emphysema.  2018 the patient had spirometry which showed FVC 4.3 with FEV1 2.6.  His O2 saturation on room air is 98%.  He does complain of some shortness of breath when he gets dizzy.  He denies any cardiac symptoms of pain palpitation orthopnea ankle edema or PND.  Patient denies recent symptoms of upper respiratory infection fever productive cough or pleuritic pain.  He denies weight loss headache bone pain or other symptoms of metastatic disease.  The patient is working full-time.  He is supporting his wife who is battling metastatic breast cancer.  Patient states he is never had previous surgery or any thoracic trauma.  Past Medical History:  Diagnosis Date  . Bursitis of left shoulder   . GERD (gastroesophageal reflux disease)   . Hyperlipidemia   . Restless leg syndrome     Past Surgical History:  Procedure Laterality Date  . CIRCUMCISION  12/13/2003    Family History   Problem Relation Age of Onset  . Esophageal cancer Brother   . Colon cancer Father 9  . Heart disease Brother   . Arthritis Unknown     Social History Social History   Tobacco Use  . Smoking status: Current Every Day Smoker    Packs/day: 0.10    Years: 42.00    Pack years: 4.20    Types: E-cigarettes, Cigars    Start date: 09/17/1974  . Smokeless tobacco: Never Used  . Tobacco comment: trying to quit all of it  Substance Use Topics  . Alcohol use: No    Comment: occ.  . Drug use: Yes    Frequency: 7.0 times per week    Types: Marijuana    Comment: crack use distant past /weed yesterday    Current Outpatient Medications  Medication Sig Dispense Refill  . atorvastatin (LIPITOR) 40 MG tablet Take 1 tablet (40 mg total) by mouth daily. 90 tablet 3  . betamethasone valerate ointment (VALISONE) 0.1 % Apply 1 application 2 (two) times daily topically. 45 g 1  . fish oil-omega-3 fatty acids 1000 MG capsule Take 2 g by mouth daily.    [name] gabapentin (NEURONTIN) 600 MG tablet Take 1 tablet (600 mg total) by mouth at bedtime. 90 tablet 5  . Multiple Vitamin (MULTIVITAMIN) tablet Take 1 tablet by mouth daily.    [name] omeprazole (PRILOSEC) 20 MG capsule Take 20 mg by mouth as needed.    . umeclidinium bromide (INCRUSE ELLIPTA) 62.5 MCG/INH AEPB Inhale 1 puff into the lungs  daily. 1 each 11   Current Facility-Administered Medications  Medication Dose Route Frequency Provider Last Rate Last Dose  . 0.9 %  sodium chloride infusion  500 mL Intravenous Continuous Gatha Mayer, MD        Allergies  Allergen Reactions  . Lactose Intolerance (Gi) Other (See Comments)    Unsettled bowel.     Review of Systems         Review of Systems :  [ y ] = yes, [  ] = no        General :  Weight gain [   ]    Weight loss  [   ]  Fatigue [  ]  Fever [  ]  Chills  [  ]                                Weakness  [  ]           HEENT    Headache [  ]  Dizziness [  ]  Blurred vision [  ] Glaucoma  [   ]   patient has had dental extractions over the past several months of necrotic teeth.  He has a few teeth remaining which appear to be in fairly good shape                        Nosebleeds [  ] Painful or loose teeth [  ]        Cardiac :  Chest pain/ pressure [  ]  Resting SOB [  ] exertional SOB [sometimes]                        Orthopnea [  ]  Pedal edema  [  ]  Palpitations [  ] Syncope/presyncope [ ]                         Paroxysmal nocturnal dyspnea [  ]         Pulmonary : cough [intermittent]  wheezing [  ]  Hemoptysis [  ] Sputum [  ] Snoring [  ]                              Pneumothorax [  ]  Sleep apnea [  ]        GI : Vomiting [  ]  Dysphagia [  ]  Melena  [  ]  Abdominal pain [  ] BRBPR [  ]              Heart burn [  ]  Constipation [  ] Diarrhea  [  ] Colonoscopy [   ]        GU : Hematuria [  ]  Dysuria [  ]  Nocturia [  ] UTI's [  ]        Vascular : Claudication [  ]  Rest pain [  ]  DVT [  ] Vein stripping [  ] leg ulcers [  ]                          TIA [  ] Stroke [  ]  Varicose veins [  ]  NEURO :  Headaches  [  ] Seizures [  ] Vision changes [  ] Paresthesias [  ]                                       Seizures [  ]        Musculoskeletal :  Arthritis [  ] Gout  [  ]  Back pain [  ]  Joint pain [  ]        Skin :  Rash [  ]  Melanoma [  ] Sores [  ]        Heme : Bleeding problems [  ]Clotting Disorders [  ] Anemia [  ]Blood Transfusion [ ]         Endocrine : Diabetes [  ] Heat or Cold intolerance [  ] Polyuria [  ]excessive thirst [ ]         Psych : Depression [  ]  Anxiety [  ]  Psych hospitalizations [  ] Memory change [  ]                                              BP 136/90 (BP Location: Left Arm, Patient Position: Sitting, Cuff Size: Normal)   Pulse 80 Comment: SI IRREG  Resp 16   Ht 6' (1.829 m)   Wt 124 lb (56.2 kg)   SpO2 98% Comment: ON RA  BMI 16.82 kg/m  Physical Exam        Exam    General- alert and comfortable, thin  middle-aged AA male    neck- no JVD, no cervical adenopathy palpable, no carotid bruit   Lungs- clear without rales, wheezes   Cor- regular rate and rhythm, no murmur , gallop   Abdomen- soft, non-tender   Extremities - warm, non-tender, minimal edema   Neuro- oriented, appropriate, no focal weakness   Diagnostic Tests: CT scan shows mild emphysema with 2 left lower lobe nodules 1 of which is 10 mm with spiculation and very suspicious for cancer.  Impression: Patient appears to have a left lower lung nodule which is suspicious for bronchogenic carcinoma.  We will proceed with PET scan to assess its malignant potential and to check for distant disease.  If the nodule is hypermetabolic without evidence of nodal involvement he would be recommended for surgical resection.  His PFTs are adequate for lobectomy.  He would need a head CT scan prior to surgery to complete clinical staging.  Plan: Return after PET scan to assess malignant potential of left lower lobe lung nodule  and to plan  possible surgery Len Childs, MD [facility] [phone]

## 2017-12-31 NOTE — Progress Notes (Signed)
PCP is Rogue Bussing, MD Referring Provider is Larey Seat*  Chief Complaint  Patient presents with  . Lung Lesion    per LO DOSE CT CHEST...LLLobe/RLLobe Endobronchial..Marland KitchenSpirometry 08/01/17    HPI:   Past Medical History:  Diagnosis Date  . Bursitis of left shoulder   . GERD (gastroesophageal reflux disease)   . Hyperlipidemia   . Restless leg syndrome     Past Surgical History:  Procedure Laterality Date  . CIRCUMCISION  12/13/2003    Family History  Problem Relation Age of Onset  . Esophageal cancer Brother   . Colon cancer Father 70  . Heart disease Brother   . Arthritis Unknown     Social History Social History   Tobacco Use  . Smoking status: Current Every Day Smoker    Packs/day: 0.10    Years: 42.00    Pack years: 4.20    Types: E-cigarettes, Cigars    Start date: 09/17/1974  . Smokeless tobacco: Never Used  . Tobacco comment: trying to quit all of it  Substance Use Topics  . Alcohol use: No    Comment: occ.  . Drug use: Yes    Frequency: 7.0 times per week    Types: Marijuana    Comment: crack use distant past /weed yesterday    Current Outpatient Medications  Medication Sig Dispense Refill  . atorvastatin (LIPITOR) 40 MG tablet Take 1 tablet (40 mg total) by mouth daily. 90 tablet 3  . betamethasone valerate ointment (VALISONE) 0.1 % Apply 1 application 2 (two) times daily topically. 45 g 1  . fish oil-omega-3 fatty acids 1000 MG capsule Take 2 g by mouth daily.    [name] gabapentin (NEURONTIN) 600 MG tablet Take 1 tablet (600 mg total) by mouth at bedtime. 90 tablet 5  . Multiple Vitamin (MULTIVITAMIN) tablet Take 1 tablet by mouth daily.    [name] omeprazole (PRILOSEC) 20 MG capsule Take 20 mg by mouth as needed.    . umeclidinium bromide (INCRUSE ELLIPTA) 62.5 MCG/INH AEPB Inhale 1 puff into the lungs daily. 1 each 11   Current Facility-Administered Medications  Medication Dose Route Frequency Provider Last Rate Last Dose  . 0.9  %  sodium chloride infusion  500 mL Intravenous Continuous Gatha Mayer, MD        Allergies  Allergen Reactions  . Lactose Intolerance (Gi) Other (See Comments)    Unsettled bowel.     Review of Systems  BP 136/90 (BP Location: Left Arm, Patient Position: Sitting, Cuff Size: Normal)   Pulse 80 Comment: SI IRREG  Resp 16   Ht 6' (1.829 m)   Wt 124 lb (56.2 kg)   SpO2 98% Comment: ON RA  BMI 16.82 kg/m  Physical Exam   Diagnostic Tests:   Impression:   Plan:   Curtis Childs, MD [facility] [phone]

## 2018-01-06 ENCOUNTER — Encounter (HOSPITAL_COMMUNITY): Payer: BLUE CROSS/BLUE SHIELD

## 2018-01-07 ENCOUNTER — Ambulatory Visit (HOSPITAL_COMMUNITY)
Admission: RE | Admit: 2018-01-07 | Discharge: 2018-01-07 | Disposition: A | Discharge status: Home / Self Care | Payer: BLUE CROSS/BLUE SHIELD | Source: Ambulatory Visit | Attending: Cardiothoracic Surgery | Admitting: Cardiothoracic Surgery

## 2018-01-07 DIAGNOSIS — I700 Atherosclerosis of aorta: Secondary | ICD-10-CM | POA: Diagnosis not present

## 2018-01-07 DIAGNOSIS — R911 Solitary pulmonary nodule: Secondary | ICD-10-CM | POA: Insufficient documentation

## 2018-01-07 DIAGNOSIS — K089 Disorder of teeth and supporting structures, unspecified: Secondary | ICD-10-CM | POA: Diagnosis not present

## 2018-01-07 LAB — GLUCOSE, CAPILLARY: Glucose-Capillary: 108 mg/dL — ABNORMAL HIGH (ref 65–99)

## 2018-01-07 MED ORDER — FLUDEOXYGLUCOSE F - 18 (FDG) INJECTION
6.2800 | Freq: Once | INTRAVENOUS | Status: AC | PRN
  Administered 2018-01-07: 6.28 via INTRAVENOUS

## 2018-01-08 ENCOUNTER — Other Ambulatory Visit: Payer: Self-pay

## 2018-01-08 ENCOUNTER — Ambulatory Visit (INDEPENDENT_AMBULATORY_CARE_PROVIDER_SITE_OTHER): Payer: BLUE CROSS/BLUE SHIELD | Admitting: Cardiothoracic Surgery

## 2018-01-08 ENCOUNTER — Other Ambulatory Visit: Payer: Self-pay | Admitting: *Deleted

## 2018-01-08 ENCOUNTER — Encounter: Payer: Self-pay | Admitting: Cardiothoracic Surgery

## 2018-01-08 VITALS — BP 125/83 | HR 80 | Resp 16 | Ht 72.0 in | Wt 124.0 lb

## 2018-01-08 DIAGNOSIS — R918 Other nonspecific abnormal finding of lung field: Secondary | ICD-10-CM | POA: Diagnosis not present

## 2018-01-08 DIAGNOSIS — C7931 Secondary malignant neoplasm of brain: Secondary | ICD-10-CM

## 2018-01-08 DIAGNOSIS — D381 Neoplasm of uncertain behavior of trachea, bronchus and lung: Secondary | ICD-10-CM

## 2018-01-08 DIAGNOSIS — R911 Solitary pulmonary nodule: Secondary | ICD-10-CM

## 2018-01-08 DIAGNOSIS — C3432 Malignant neoplasm of lower lobe, left bronchus or lung: Secondary | ICD-10-CM

## 2018-01-08 NOTE — Progress Notes (Signed)
PCP is Rogue Bussing, MD Referring Provider is Larey Seat*  Chief Complaint  Patient presents with  . Lung Lesion    to discuss PET..01/07/18    HPI: Patient returns with PET scan to discuss recently diagnosed 1 cm left lower lobe irregular nodule showing increasing growth over the past several months with CT scans.  Activity on PET scan was hypermetabolic with SUV of 7.3.  There is no evidence of mediastinal nodal involvement or distant metastatic disease.  No other pulmonary nodules with hypermetabolic activity.  The patient has stopped smoking. Patient has had preoperative PFTs which are adequate for lobectomy. The patient will be prepared for surgery and have a head CT scan to complete clinical staging.  The patient will be scheduled for surgery, left VATS and left lower lobectomy on May 3 at [facility].  We discussed the indications and benefits of surgery as well as the alternatives and potential risks including the risks of bleeding, blood transfusion, prolonged air leak, postoperative pneumonia, MI, arrhythmias, death.  He agrees to proceed with surgery.  Past Medical History:  Diagnosis Date  . Bursitis of left shoulder   . GERD (gastroesophageal reflux disease)   . Hyperlipidemia   . Restless leg syndrome     Past Surgical History:  Procedure Laterality Date  . CIRCUMCISION  12/13/2003    Family History  Problem Relation Age of Onset  . Esophageal cancer Brother   . Colon cancer Father 33  . Heart disease Brother   . Arthritis Unknown     Social History Social History   Tobacco Use  . Smoking status: Current Every Day Smoker    Packs/day: 0.10    Years: 42.00    Pack years: 4.20    Types: E-cigarettes, Cigars    Start date: 09/17/1974  . Smokeless tobacco: Never Used  . Tobacco comment: trying to quit all of it  Substance Use Topics  . Alcohol use: No    Comment: occ.  . Drug use: Yes    Frequency: 7.0 times per week    Types:  Marijuana    Comment: crack use distant past /weed yesterday    Current Outpatient Medications  Medication Sig Dispense Refill  . atorvastatin (LIPITOR) 40 MG tablet Take 1 tablet (40 mg total) by mouth daily. 90 tablet 3  . betamethasone valerate ointment (VALISONE) 0.1 % Apply 1 application 2 (two) times daily topically. 45 g 1  . fish oil-omega-3 fatty acids 1000 MG capsule Take 2 g by mouth daily.    [name] gabapentin (NEURONTIN) 600 MG tablet Take 1 tablet (600 mg total) by mouth at bedtime. 90 tablet 5  . Multiple Vitamin (MULTIVITAMIN) tablet Take 1 tablet by mouth daily.    [name] omeprazole (PRILOSEC) 20 MG capsule Take 20 mg by mouth as needed.    . umeclidinium bromide (INCRUSE ELLIPTA) 62.5 MCG/INH AEPB Inhale 1 puff into the lungs daily. 1 each 11   Current Facility-Administered Medications  Medication Dose Route Frequency Provider Last Rate Last Dose  . 0.9 %  sodium chloride infusion  500 mL Intravenous Continuous Gatha Mayer, MD        Allergies  Allergen Reactions  . Lactose Intolerance (Gi) Other (See Comments)    Unsettled bowel.     Review of Systems  No change since last visit  BP 125/83 (BP Location: Right Arm, Patient Position: Sitting, Cuff Size: Normal)   Pulse 80   Resp 16   Ht 6' (1.829  m)   Wt 124 lb (56.2 kg)   SpO2 95% Comment: RA  BMI 16.82 kg/m  Physical Exam      Exam    General- alert and comfortable    Neck- no JVD, no cervical adenopathy palpable, no carotid bruit   Lungs- clear without rales, wheezes   Cor- regular rate and rhythm, no murmur , gallop   Abdomen- soft, non-tender   Extremities - warm, non-tender, minimal edema   Neuro- oriented, appropriate, no focal weakness   Diagnostic Tests: PET scan images reviewed with patient showing hypermetabolic left lower lobe nodule now measuring 1.3 cm  Impression: Probable early stage left lower lobe bronchogenic carcinoma  Plan: Left VATS, lobectomy scheduled at [facility] May  3   Len Childs, MD [facility] [phone]

## 2018-01-10 ENCOUNTER — Ambulatory Visit: Payer: Self-pay | Admitting: Podiatry

## 2018-01-14 ENCOUNTER — Ambulatory Visit
Admission: RE | Admit: 2018-01-14 | Discharge: 2018-01-14 | Disposition: A | Discharge status: Home / Self Care | Payer: BLUE CROSS/BLUE SHIELD | Source: Ambulatory Visit | Attending: Cardiothoracic Surgery | Admitting: Cardiothoracic Surgery

## 2018-01-14 DIAGNOSIS — C7931 Secondary malignant neoplasm of brain: Secondary | ICD-10-CM

## 2018-01-14 DIAGNOSIS — C3490 Malignant neoplasm of unspecified part of unspecified bronchus or lung: Secondary | ICD-10-CM | POA: Diagnosis not present

## 2018-01-14 DIAGNOSIS — C3432 Malignant neoplasm of lower lobe, left bronchus or lung: Secondary | ICD-10-CM

## 2018-01-14 MED ORDER — IOPAMIDOL (ISOVUE-300) INJECTION 61%
75.0000 mL | Freq: Once | INTRAVENOUS | Status: AC | PRN
  Administered 2018-01-14: 75 mL via INTRAVENOUS

## 2018-01-14 NOTE — Pre-Procedure Instructions (Signed)
Curtis Henry  01/14/2018      [facility] [address], [place] [address] Phone: [phone] Fax: [phone]    Your procedure is scheduled on Jan 17, 2018.  Report to [facility] Admitting at 530 AM.  Call this number if you have problems the morning of surgery:  [phone]  Continue all medications as directed by your physician except follow these medication instructions before surgery below:   Remember:  Do not eat food or drink liquids after midnight.  Take these medicines the morning of surgery with A SIP OF WATER  Ellipta inhaler (bring with you day of surgery) Omeprazole (prilosec)  7 days prior to surgery STOP taking any Aspirin (unless otherwise instructed by your surgeon), Aleve, Naproxen, Ibuprofen, Motrin, Advil, Goody's, BC's, all herbal medications, fish oil, and all vitamins    Contacts, dentures or bridgework may not be worn into surgery.  Leave your suitcase in the car.  After surgery it may be brought to your room.  For patients admitted to the hospital, discharge time will be determined by your treatment team.  Patients discharged the day of surgery will not be allowed to drive home.   [facility]- Preparing For Surgery  Before surgery, you can play an important role. Because skin is not sterile, your skin needs to be as free of germs as possible. You can reduce the number of germs on your skin by washing with CHG (chlorahexidine gluconate) Soap before surgery.  CHG is an antiseptic cleaner which kills germs and bonds with the skin to continue killing germs even after washing.  Please do not use if you have an allergy to CHG or antibacterial soaps. If your skin becomes reddened/irritated stop using the CHG.  Do not shave (including legs and underarms) for at least 48 hours prior to first CHG shower. It is OK to shave your face.  Please follow these instructions  carefully.   1. Shower the NIGHT BEFORE SURGERY and the MORNING OF SURGERY with CHG.   2. If you chose to wash your hair, wash your hair first as usual with your normal shampoo.  3. After you shampoo, rinse your hair and body thoroughly to remove the shampoo.  4. Use CHG as you would any other liquid soap. You can apply CHG directly to the skin and wash gently with a scrungie or a clean washcloth.   5. Apply the CHG Soap to your body ONLY FROM THE NECK DOWN.  Do not use on open wounds or open sores. Avoid contact with your eyes, ears, mouth and genitals (private parts). Wash Face and genitals (private parts)  with your normal soap.  6. Wash thoroughly, paying special attention to the area where your surgery will be performed.  7. Thoroughly rinse your body with warm water from the neck down.  8. DO NOT shower/wash with your normal soap after using and rinsing off the CHG Soap.  9. Pat yourself dry with a CLEAN TOWEL.  10. Wear CLEAN PAJAMAS to bed the night before surgery, wear comfortable clothes the morning of surgery  11. Place CLEAN SHEETS on your bed the night of your first shower and DO NOT SLEEP WITH PETS.  Day of Surgery: Do not apply any deodorants/lotions. Please wear clean clothes to the hospital/surgery center.     Do not wear jewelry, make-up or nail polish.  Do not wear lotions, powders, or perfumes,  or deodorant.   Men may shave face and neck.  Do not bring valuables to the hospital.   [facility] is not responsible for any belongings or valuables.    Please read over the following fact sheets that you were given. Pain Booklet, Coughing and Deep Breathing, MRSA Information and Surgical Site Infection Prevention

## 2018-01-15 ENCOUNTER — Encounter (HOSPITAL_COMMUNITY)
Admission: RE | Admit: 2018-01-15 | Discharge: 2018-01-15 | Disposition: A | Discharge status: Home / Self Care | Payer: BLUE CROSS/BLUE SHIELD | Source: Ambulatory Visit | Attending: Cardiothoracic Surgery | Admitting: Cardiothoracic Surgery

## 2018-01-15 ENCOUNTER — Other Ambulatory Visit: Payer: Self-pay

## 2018-01-15 ENCOUNTER — Encounter (HOSPITAL_COMMUNITY): Payer: Self-pay

## 2018-01-15 DIAGNOSIS — Z79899 Other long term (current) drug therapy: Secondary | ICD-10-CM | POA: Diagnosis not present

## 2018-01-15 DIAGNOSIS — Z01818 Encounter for other preprocedural examination: Secondary | ICD-10-CM | POA: Insufficient documentation

## 2018-01-15 DIAGNOSIS — Z01812 Encounter for preprocedural laboratory examination: Secondary | ICD-10-CM | POA: Diagnosis not present

## 2018-01-15 DIAGNOSIS — Z0183 Encounter for blood typing: Secondary | ICD-10-CM | POA: Insufficient documentation

## 2018-01-15 DIAGNOSIS — J449 Chronic obstructive pulmonary disease, unspecified: Secondary | ICD-10-CM | POA: Diagnosis not present

## 2018-01-15 DIAGNOSIS — G2581 Restless legs syndrome: Secondary | ICD-10-CM | POA: Insufficient documentation

## 2018-01-15 DIAGNOSIS — K219 Gastro-esophageal reflux disease without esophagitis: Secondary | ICD-10-CM | POA: Diagnosis not present

## 2018-01-15 DIAGNOSIS — R911 Solitary pulmonary nodule: Secondary | ICD-10-CM | POA: Diagnosis not present

## 2018-01-15 DIAGNOSIS — E785 Hyperlipidemia, unspecified: Secondary | ICD-10-CM | POA: Insufficient documentation

## 2018-01-15 LAB — URINALYSIS, ROUTINE W REFLEX MICROSCOPIC
Bacteria, UA: NONE SEEN
Bilirubin Urine: NEGATIVE
Glucose, UA: NEGATIVE mg/dL
Ketones, ur: NEGATIVE mg/dL
Leukocytes, UA: NEGATIVE
Nitrite: NEGATIVE
Protein, ur: NEGATIVE mg/dL
Specific Gravity, Urine: 1.008 (ref 1.005–1.030)
pH: 6 (ref 5.0–8.0)

## 2018-01-15 LAB — SURGICAL PCR SCREEN
MRSA, PCR: NEGATIVE
Staphylococcus aureus: NEGATIVE

## 2018-01-15 LAB — COMPREHENSIVE METABOLIC PANEL
ALT: 23 U/L (ref 17–63)
AST: 32 U/L (ref 15–41)
Albumin: 3.5 g/dL (ref 3.5–5.0)
Alkaline Phosphatase: 50 U/L (ref 38–126)
Anion gap: 11 (ref 5–15)
BUN: 5 mg/dL — ABNORMAL LOW (ref 6–20)
CO2: 25 mmol/L (ref 22–32)
Calcium: 9.1 mg/dL (ref 8.9–10.3)
Chloride: 103 mmol/L (ref 101–111)
Creatinine, Ser: 1.02 mg/dL (ref 0.61–1.24)
GFR calc Af Amer: >60 mL/min (ref >60)
GFR calc non Af Amer: >60 mL/min (ref >60)
Glucose, Bld: 92 mg/dL (ref 65–99)
Potassium: 4.1 mmol/L (ref 3.5–5.1)
Sodium: 139 mmol/L (ref 135–145)
Total Bilirubin: 0.7 mg/dL (ref 0.3–1.2)
Total Protein: 6.7 g/dL (ref 6.5–8.1)

## 2018-01-15 LAB — BLOOD GAS, ARTERIAL
Acid-Base Excess: 3.3 mmol/L — ABNORMAL HIGH (ref 0.0–2.0)
Bicarbonate: 27.1 mmol/L (ref 20.0–28.0)
Drawn by: 421801
FIO2: 21
O2 Saturation: 98.6 %
Patient temperature: 98.6
pCO2 arterial: 39.6 mmHg (ref 32.0–48.0)
pH, Arterial: 7.449 (ref 7.350–7.450)
pO2, Arterial: 123 mmHg — ABNORMAL HIGH (ref 83.0–108.0)

## 2018-01-15 LAB — CBC
HCT: 40.5 % (ref 39.0–52.0)
Hemoglobin: 13.6 g/dL (ref 13.0–17.0)
MCH: 28.7 pg (ref 26.0–34.0)
MCHC: 33.6 g/dL (ref 30.0–36.0)
MCV: 85.4 fL (ref 78.0–100.0)
Platelets: 171 10*3/uL (ref 150–400)
RBC: 4.74 MIL/uL (ref 4.22–5.81)
RDW: 13.8 % (ref 11.5–15.5)
WBC: 6.1 10*3/uL (ref 4.0–10.5)

## 2018-01-15 LAB — APTT: aPTT: 36 seconds (ref 24–36)

## 2018-01-15 LAB — PROTIME-INR
INR: 1.06
Prothrombin Time: 13.7 seconds (ref 11.4–15.2)

## 2018-01-15 LAB — ABO/RH: ABO/RH(D): O POS

## 2018-01-15 NOTE — Progress Notes (Signed)
PYK:DXIPJASNKN, Sharman Cheek, MD  Cardiologist: pt denies  EKG: pt denies past year, obtained today  Stress test: pt denies ever  ECHO: pt denies ever  Cardiac Cath: pt denies ever  Chest x-ray: pt denies past year, obtained today

## 2018-01-16 NOTE — Anesthesia Preprocedure Evaluation (Addendum)
Anesthesia Evaluation  Patient identified by MRN, date of birth, ID band Patient awake    Reviewed: Allergy & Precautions, NPO status , Patient's Chart, lab work & pertinent test results  History of Anesthesia Complications Negative for: history of anesthetic complications  Airway Mallampati: II  TM Distance: >3 FB Neck ROM: Full    Dental  (+) Poor Dentition, Missing, Loose, Dental Advisory Given, Chipped   Pulmonary COPD, Current Smoker,  Lung mass   breath sounds clear to auscultation       Cardiovascular negative cardio ROS   Rhythm:Regular Rate:Normal     Neuro/Psych negative neurological ROS     GI/Hepatic GERD  Medicated and Controlled,(+)     substance abuse  marijuana use,   Endo/Other  negative endocrine ROS  Renal/GU negative Renal ROS     Musculoskeletal   Abdominal   Peds  Hematology negative hematology ROS (+)   Anesthesia Other Findings   Reproductive/Obstetrics                            Anesthesia Physical Anesthesia Plan  ASA: III  Anesthesia Plan: General   Post-op Pain Management:    Induction: Intravenous  PONV Risk Score and Plan: 2 and Ondansetron and Dexamethasone  Airway Management Planned: Double Lumen EBT  Additional Equipment: Arterial line, CVP and Ultrasound Guidance Line Placement  Intra-op Plan:   Post-operative Plan: Extubation in OR  Informed Consent: I have reviewed the patients History and Physical, chart, labs and discussed the procedure including the risks, benefits and alternatives for the proposed anesthesia with the patient or authorized representative who has indicated his/her understanding and acceptance.   Dental advisory given  Plan Discussed with: CRNA and Surgeon  Anesthesia Plan Comments: (Plan routine monitors, A line, central line, GETA with DLT)        Anesthesia Quick Evaluation

## 2018-01-16 NOTE — Progress Notes (Signed)
Anesthesia Chart Review:   Case:  [id number] Date/Time:  01/17/18 0715   Procedure:  VIDEO ASSISTED THORACOSCOPY (VATS)/ LOBECTOMY (Left Chest)   Anesthesia type:  General   Pre-op diagnosis:  lung lesion   Location:  [facility] OR ROOM 10 / [facility]   Surgeon:  Ivin Poot, MD      DISCUSSION: Curtis Henry is a 62 year old male   VS: BP 131/70   Pulse 84   Temp 36.7 C   Resp 18   Ht 6' (1.829 m)   Wt 127 lb 1.6 oz (57.7 kg)   SpO2 97%   BMI 17.24 kg/m    PROVIDERS: PCP is Rogue Bussing, MD   LABS: Labs reviewed: Acceptable for surgery. (all labs ordered are listed, but only abnormal results are displayed)  Labs Reviewed  BLOOD GAS, ARTERIAL - Abnormal; Notable for the following components:      Result Value   pO2, Arterial 123 (*)    Acid-Base Excess 3.3 (*)    All other components within normal limits  COMPREHENSIVE METABOLIC PANEL - Abnormal; Notable for the following components:   BUN 5 (*)    All other components within normal limits  URINALYSIS, ROUTINE W REFLEX MICROSCOPIC - Abnormal; Notable for the following components:   Hgb urine dipstick SMALL (*)    All other components within normal limits  SURGICAL PCR SCREEN  APTT  CBC  PROTIME-INR  TYPE AND SCREEN  ABO/RH     IMAGES:  CXR 01/15/18:  - Abnormal left lower lobe lung nodule. Underlying COPD. No pneumonia, CHF, nor other acute cardiopulmonary abnormality.   EKG 01/15/18:  - Sinus rhythm with short PR. LAFB. Cannot rule out Inferior infarct (masked by fascicular block?), age undetermined.  - Left anterior hemiblock new and short PR is now present - Reviewed EKG with Dr. Conrad Edmonton   Past Medical History:  Diagnosis Date  . Bursitis of left shoulder   . GERD (gastroesophageal reflux disease)   . Hyperlipidemia   . Restless leg syndrome     Past Surgical History:  Procedure Laterality Date  . CIRCUMCISION  12/13/2003  . COLONOSCOPY      MEDICATIONS: . atorvastatin (LIPITOR) 40 MG tablet  .  betamethasone valerate ointment (VALISONE) 0.1 %  . fish oil-omega-3 fatty acids 1000 MG capsule  . gabapentin (NEURONTIN) 600 MG tablet  . Multiple Vitamin (MULTIVITAMIN) tablet  . omeprazole (PRILOSEC) 20 MG capsule  . umeclidinium bromide (INCRUSE ELLIPTA) 62.5 MCG/INH AEPB   . 0.9 %  sodium chloride infusion    If no changes, I anticipate Curtis Henry can proceed with surgery as scheduled.   Willeen Cass, FNP-BC [facility] Short Stay Surgical Center/Anesthesiology Phone: [phone] 01/16/2018 11:50 AM

## 2018-01-17 ENCOUNTER — Other Ambulatory Visit: Payer: Self-pay

## 2018-01-17 ENCOUNTER — Inpatient Hospital Stay (HOSPITAL_COMMUNITY): Payer: BLUE CROSS/BLUE SHIELD | Admitting: Emergency Medicine

## 2018-01-17 ENCOUNTER — Inpatient Hospital Stay (HOSPITAL_COMMUNITY): Payer: BLUE CROSS/BLUE SHIELD | Admitting: Anesthesiology

## 2018-01-17 ENCOUNTER — Inpatient Hospital Stay (HOSPITAL_COMMUNITY): Payer: BLUE CROSS/BLUE SHIELD

## 2018-01-17 ENCOUNTER — Encounter (HOSPITAL_COMMUNITY)
Admission: RE | Disposition: A | Discharge status: Home / Self Care | Payer: Self-pay | Source: Ambulatory Visit | Attending: Cardiothoracic Surgery

## 2018-01-17 ENCOUNTER — Inpatient Hospital Stay (HOSPITAL_COMMUNITY)
Admission: RE | Admit: 2018-01-17 | Discharge: 2018-01-28 | DRG: 164 | Disposition: A | Discharge status: Home / Self Care | Payer: BLUE CROSS/BLUE SHIELD | Source: Ambulatory Visit | Attending: Cardiothoracic Surgery | Admitting: Cardiothoracic Surgery

## 2018-01-17 ENCOUNTER — Encounter (HOSPITAL_COMMUNITY): Payer: Self-pay | Admitting: *Deleted

## 2018-01-17 DIAGNOSIS — J449 Chronic obstructive pulmonary disease, unspecified: Secondary | ICD-10-CM | POA: Diagnosis not present

## 2018-01-17 DIAGNOSIS — D62 Acute posthemorrhagic anemia: Secondary | ICD-10-CM | POA: Diagnosis not present

## 2018-01-17 DIAGNOSIS — I10 Essential (primary) hypertension: Secondary | ICD-10-CM | POA: Diagnosis present

## 2018-01-17 DIAGNOSIS — R911 Solitary pulmonary nodule: Secondary | ICD-10-CM

## 2018-01-17 DIAGNOSIS — K219 Gastro-esophageal reflux disease without esophagitis: Secondary | ICD-10-CM | POA: Diagnosis not present

## 2018-01-17 DIAGNOSIS — Z681 Body mass index (BMI) 19 or less, adult: Secondary | ICD-10-CM

## 2018-01-17 DIAGNOSIS — J9382 Other air leak: Secondary | ICD-10-CM | POA: Diagnosis not present

## 2018-01-17 DIAGNOSIS — J439 Emphysema, unspecified: Secondary | ICD-10-CM | POA: Diagnosis not present

## 2018-01-17 DIAGNOSIS — I483 Typical atrial flutter: Secondary | ICD-10-CM | POA: Diagnosis not present

## 2018-01-17 DIAGNOSIS — J984 Other disorders of lung: Secondary | ICD-10-CM | POA: Diagnosis not present

## 2018-01-17 DIAGNOSIS — R001 Bradycardia, unspecified: Secondary | ICD-10-CM | POA: Diagnosis not present

## 2018-01-17 DIAGNOSIS — Z4682 Encounter for fitting and adjustment of non-vascular catheter: Secondary | ICD-10-CM | POA: Diagnosis not present

## 2018-01-17 DIAGNOSIS — E785 Hyperlipidemia, unspecified: Secondary | ICD-10-CM | POA: Diagnosis present

## 2018-01-17 DIAGNOSIS — C3432 Malignant neoplasm of lower lobe, left bronchus or lung: Principal | ICD-10-CM | POA: Diagnosis present

## 2018-01-17 DIAGNOSIS — Z800 Family history of malignant neoplasm of digestive organs: Secondary | ICD-10-CM

## 2018-01-17 DIAGNOSIS — E440 Moderate protein-calorie malnutrition: Secondary | ICD-10-CM | POA: Diagnosis not present

## 2018-01-17 DIAGNOSIS — Z87891 Personal history of nicotine dependence: Secondary | ICD-10-CM

## 2018-01-17 DIAGNOSIS — Z902 Acquired absence of lung [part of]: Secondary | ICD-10-CM

## 2018-01-17 DIAGNOSIS — Z8249 Family history of ischemic heart disease and other diseases of the circulatory system: Secondary | ICD-10-CM

## 2018-01-17 DIAGNOSIS — J90 Pleural effusion, not elsewhere classified: Secondary | ICD-10-CM | POA: Diagnosis not present

## 2018-01-17 DIAGNOSIS — M719 Bursopathy, unspecified: Secondary | ICD-10-CM | POA: Diagnosis not present

## 2018-01-17 DIAGNOSIS — R918 Other nonspecific abnormal finding of lung field: Secondary | ICD-10-CM | POA: Diagnosis not present

## 2018-01-17 DIAGNOSIS — E871 Hypo-osmolality and hyponatremia: Secondary | ICD-10-CM | POA: Diagnosis not present

## 2018-01-17 DIAGNOSIS — J942 Hemothorax: Secondary | ICD-10-CM | POA: Diagnosis not present

## 2018-01-17 DIAGNOSIS — J9811 Atelectasis: Secondary | ICD-10-CM | POA: Diagnosis not present

## 2018-01-17 DIAGNOSIS — J939 Pneumothorax, unspecified: Secondary | ICD-10-CM | POA: Diagnosis not present

## 2018-01-17 DIAGNOSIS — G2581 Restless legs syndrome: Secondary | ICD-10-CM | POA: Diagnosis not present

## 2018-01-17 DIAGNOSIS — Z9689 Presence of other specified functional implants: Secondary | ICD-10-CM

## 2018-01-17 DIAGNOSIS — R42 Dizziness and giddiness: Secondary | ICD-10-CM | POA: Diagnosis not present

## 2018-01-17 HISTORY — DX: Malignant neoplasm of unspecified part of unspecified bronchus or lung: C34.90

## 2018-01-17 HISTORY — PX: VIDEO ASSISTED THORACOSCOPY (VATS)/ LOBECTOMY: SHX6169

## 2018-01-17 LAB — POCT I-STAT 3, ART BLOOD GAS (G3+)
Acid-base deficit: 1 mmol/L (ref 0.0–2.0)
Bicarbonate: 26.4 mmol/L (ref 20.0–28.0)
O2 Saturation: 96 %
Patient temperature: 97.8
TCO2: 28 mmol/L (ref 22–32)
pCO2 arterial: 52.9 mmHg — ABNORMAL HIGH (ref 32.0–48.0)
pH, Arterial: 7.305 — ABNORMAL LOW (ref 7.350–7.450)
pO2, Arterial: 93 mmHg (ref 83.0–108.0)

## 2018-01-17 LAB — PREPARE RBC (CROSSMATCH)

## 2018-01-17 SURGERY — VIDEO ASSISTED THORACOSCOPY (VATS)/ LOBECTOMY
Anesthesia: General | Site: Chest | Laterality: Left

## 2018-01-17 MED ORDER — HEMOSTATIC AGENTS (NO CHARGE) OPTIME
TOPICAL | Status: DC | PRN
  Administered 2018-01-17: 1 via TOPICAL

## 2018-01-17 MED ORDER — ONDANSETRON HCL 4 MG/2ML IJ SOLN: 4.0000 mg | Freq: Four times a day (QID) | INTRAMUSCULAR | Status: DC | PRN

## 2018-01-17 MED ORDER — SODIUM CHLORIDE 0.9% FLUSH: 9.0000 mL | INTRAVENOUS | Status: DC | PRN

## 2018-01-17 MED ORDER — SODIUM CHLORIDE 0.45 % IV SOLN
INTRAVENOUS | Status: DC
  Administered 2018-01-17 – 2018-01-19 (×2): via INTRAVENOUS

## 2018-01-17 MED ORDER — EPHEDRINE SULFATE 50 MG/ML IJ SOLN
INTRAMUSCULAR | Status: AC
  Filled 2018-01-17: qty 1

## 2018-01-17 MED ORDER — ORAL CARE MOUTH RINSE
15.0000 mL | Freq: Two times a day (BID) | OROMUCOSAL | Status: DC
  Administered 2018-01-17 – 2018-01-20 (×4): 15 mL via OROMUCOSAL

## 2018-01-17 MED ORDER — UMECLIDINIUM BROMIDE 62.5 MCG/INH IN AEPB
1.0000 | INHALATION_SPRAY | Freq: Every day | RESPIRATORY_TRACT | Status: DC
  Administered 2018-01-17 – 2018-01-28 (×11): 1 via RESPIRATORY_TRACT
  Filled 2018-01-17 (×2): qty 7

## 2018-01-17 MED ORDER — MEPERIDINE HCL 50 MG/ML IJ SOLN: 6.2500 mg | INTRAMUSCULAR | Status: DC | PRN

## 2018-01-17 MED ORDER — FENTANYL CITRATE (PF) 250 MCG/5ML IJ SOLN
INTRAMUSCULAR | Status: AC
  Filled 2018-01-17: qty 5

## 2018-01-17 MED ORDER — DEXAMETHASONE SODIUM PHOSPHATE 10 MG/ML IJ SOLN
INTRAMUSCULAR | Status: AC
  Filled 2018-01-17: qty 1

## 2018-01-17 MED ORDER — GABAPENTIN 600 MG PO TABS
600.0000 mg | ORAL_TABLET | Freq: Every day | ORAL | Status: DC
  Administered 2018-01-17 – 2018-01-27 (×11): 600 mg via ORAL
  Filled 2018-01-17 (×11): qty 1

## 2018-01-17 MED ORDER — ROCURONIUM BROMIDE 10 MG/ML (PF) SYRINGE
PREFILLED_SYRINGE | INTRAVENOUS | Status: DC | PRN
  Administered 2018-01-17: 20 mg via INTRAVENOUS
  Administered 2018-01-17: 50 mg via INTRAVENOUS

## 2018-01-17 MED ORDER — KETOROLAC TROMETHAMINE 15 MG/ML IJ SOLN
INTRAMUSCULAR | Status: DC | PRN
  Administered 2018-01-17: 15 mg via INTRAVENOUS

## 2018-01-17 MED ORDER — DEXAMETHASONE SODIUM PHOSPHATE 10 MG/ML IJ SOLN
INTRAMUSCULAR | Status: DC | PRN
  Administered 2018-01-17: 10 mg via INTRAVENOUS

## 2018-01-17 MED ORDER — ACETAMINOPHEN 500 MG PO TABS
1000.0000 mg | ORAL_TABLET | Freq: Four times a day (QID) | ORAL | Status: AC
  Administered 2018-01-17 – 2018-01-22 (×18): 1000 mg via ORAL
  Filled 2018-01-17 (×19): qty 2

## 2018-01-17 MED ORDER — BUPIVACAINE HCL (PF) 0.5 % IJ SOLN
INTRAMUSCULAR | Status: DC | PRN
  Administered 2018-01-17: 10 mL

## 2018-01-17 MED ORDER — ONDANSETRON HCL 4 MG/2ML IJ SOLN
INTRAMUSCULAR | Status: AC
  Filled 2018-01-17: qty 2

## 2018-01-17 MED ORDER — SUGAMMADEX SODIUM 200 MG/2ML IV SOLN
INTRAVENOUS | Status: DC | PRN
  Administered 2018-01-17: 150 mg via INTRAVENOUS

## 2018-01-17 MED ORDER — 0.9 % SODIUM CHLORIDE (POUR BTL) OPTIME
TOPICAL | Status: DC | PRN
  Administered 2018-01-17: 1000 mL

## 2018-01-17 MED ORDER — ATORVASTATIN CALCIUM 40 MG PO TABS
40.0000 mg | ORAL_TABLET | Freq: Every day | ORAL | Status: DC
  Administered 2018-01-17 – 2018-01-27 (×11): 40 mg via ORAL
  Filled 2018-01-17 (×11): qty 1

## 2018-01-17 MED ORDER — OXYCODONE HCL 5 MG PO TABS
5.0000 mg | ORAL_TABLET | ORAL | Status: DC | PRN
  Administered 2018-01-17 – 2018-01-19 (×2): 10 mg via ORAL
  Administered 2018-01-19: 5 mg via ORAL
  Administered 2018-01-21 – 2018-01-28 (×11): 10 mg via ORAL
  Filled 2018-01-17 (×2): qty 2
  Filled 2018-01-17: qty 1
  Filled 2018-01-17 (×7): qty 2
  Filled 2018-01-17: qty 1
  Filled 2018-01-17 (×5): qty 2

## 2018-01-17 MED ORDER — PROMETHAZINE HCL 25 MG/ML IJ SOLN: 6.2500 mg | INTRAMUSCULAR | Status: DC | PRN

## 2018-01-17 MED ORDER — BUPIVACAINE HCL (PF) 0.5 % IJ SOLN
INTRAMUSCULAR | Status: AC
  Filled 2018-01-17: qty 10

## 2018-01-17 MED ORDER — SUGAMMADEX SODIUM 200 MG/2ML IV SOLN
INTRAVENOUS | Status: AC
  Filled 2018-01-17: qty 2

## 2018-01-17 MED ORDER — KETOROLAC TROMETHAMINE 30 MG/ML IJ SOLN
INTRAMUSCULAR | Status: AC
  Filled 2018-01-17: qty 1

## 2018-01-17 MED ORDER — ONDANSETRON HCL 4 MG/2ML IJ SOLN
4.0000 mg | Freq: Four times a day (QID) | INTRAMUSCULAR | Status: DC | PRN
  Administered 2018-01-18 – 2018-01-19 (×5): 4 mg via INTRAVENOUS
  Filled 2018-01-17 (×5): qty 2

## 2018-01-17 MED ORDER — HYDROMORPHONE HCL 2 MG/ML IJ SOLN
0.2500 mg | INTRAMUSCULAR | Status: DC | PRN
  Administered 2018-01-17 (×3): 0.5 mg via INTRAVENOUS

## 2018-01-17 MED ORDER — LACTATED RINGERS IV SOLN
INTRAVENOUS | Status: DC | PRN
  Administered 2018-01-17 (×2): via INTRAVENOUS

## 2018-01-17 MED ORDER — TRAMADOL HCL 50 MG PO TABS
50.0000 mg | ORAL_TABLET | Freq: Four times a day (QID) | ORAL | Status: DC | PRN
  Administered 2018-01-22 – 2018-01-24 (×3): 100 mg via ORAL
  Administered 2018-01-25 (×3): 50 mg via ORAL
  Administered 2018-01-25 – 2018-01-26 (×4): 100 mg via ORAL
  Administered 2018-01-26 – 2018-01-27 (×2): 50 mg via ORAL
  Administered 2018-01-28: 100 mg via ORAL
  Filled 2018-01-17: qty 1
  Filled 2018-01-17 (×6): qty 2
  Filled 2018-01-17 (×2): qty 1
  Filled 2018-01-17 (×2): qty 2
  Filled 2018-01-17 (×3): qty 1
  Filled 2018-01-17: qty 2

## 2018-01-17 MED ORDER — CEFAZOLIN SODIUM-DEXTROSE 2-4 GM/100ML-% IV SOLN
2.0000 g | Freq: Three times a day (TID) | INTRAVENOUS | Status: AC
  Administered 2018-01-17 (×2): 2 g via INTRAVENOUS
  Filled 2018-01-17 (×2): qty 100

## 2018-01-17 MED ORDER — FENTANYL 40 MCG/ML IV SOLN
INTRAVENOUS | Status: DC
  Administered 2018-01-17: 105 ug via INTRAVENOUS
  Administered 2018-01-17: 165 ug via INTRAVENOUS
  Administered 2018-01-17: 1000 ug via INTRAVENOUS
  Administered 2018-01-18: 60 ug via INTRAVENOUS
  Administered 2018-01-18: 120 ug via INTRAVENOUS
  Administered 2018-01-18: 20 ug via INTRAVENOUS
  Administered 2018-01-18: 60 ug via INTRAVENOUS
  Administered 2018-01-18: 80 ug via INTRAVENOUS
  Administered 2018-01-18: 60 ug via INTRAVENOUS
  Administered 2018-01-19: 0 ug via INTRAVENOUS
  Administered 2018-01-19: 60 ug via INTRAVENOUS
  Administered 2018-01-19: 06:00:00 via INTRAVENOUS
  Administered 2018-01-19: 75 ug via INTRAVENOUS
  Administered 2018-01-19: 60 ug via INTRAVENOUS
  Administered 2018-01-19: 0 ug via INTRAVENOUS
  Administered 2018-01-19 – 2018-01-20 (×2): 30 ug via INTRAVENOUS
  Administered 2018-01-20 (×2): 60 ug via INTRAVENOUS
  Administered 2018-01-21: 1000 ug via INTRAVENOUS
  Administered 2018-01-21: 15 ug via INTRAVENOUS
  Administered 2018-01-22: 30 ug via INTRAVENOUS
  Administered 2018-01-22: 150 ug via INTRAVENOUS
  Administered 2018-01-23: 75 ug via INTRAVENOUS
  Administered 2018-01-23: 15 ug via INTRAVENOUS
  Filled 2018-01-17: qty 1000
  Filled 2018-01-17 (×3): qty 25

## 2018-01-17 MED ORDER — CEFAZOLIN SODIUM-DEXTROSE 2-4 GM/100ML-% IV SOLN
INTRAVENOUS | Status: AC
  Filled 2018-01-17: qty 100

## 2018-01-17 MED ORDER — SENNOSIDES-DOCUSATE SODIUM 8.6-50 MG PO TABS
1.0000 | ORAL_TABLET | Freq: Every day | ORAL | Status: DC
  Administered 2018-01-18 – 2018-01-19 (×2): 1 via ORAL
  Filled 2018-01-17 (×7): qty 1

## 2018-01-17 MED ORDER — NALOXONE HCL 0.4 MG/ML IJ SOLN: 0.4000 mg | INTRAMUSCULAR | Status: DC | PRN

## 2018-01-17 MED ORDER — ONDANSETRON HCL 4 MG/2ML IJ SOLN
INTRAMUSCULAR | Status: DC | PRN
  Administered 2018-01-17: 4 mg via INTRAVENOUS

## 2018-01-17 MED ORDER — PHENYLEPHRINE 40 MCG/ML (10ML) SYRINGE FOR IV PUSH (FOR BLOOD PRESSURE SUPPORT)
PREFILLED_SYRINGE | INTRAVENOUS | Status: AC
  Filled 2018-01-17: qty 10

## 2018-01-17 MED ORDER — DIPHENHYDRAMINE HCL 12.5 MG/5ML PO ELIX: 12.5000 mg | ORAL_SOLUTION | Freq: Four times a day (QID) | ORAL | Status: DC | PRN

## 2018-01-17 MED ORDER — FENTANYL CITRATE (PF) 250 MCG/5ML IJ SOLN
INTRAMUSCULAR | Status: DC | PRN
  Administered 2018-01-17 (×5): 50 ug via INTRAVENOUS
  Administered 2018-01-17: 200 ug via INTRAVENOUS
  Administered 2018-01-17 (×6): 50 ug via INTRAVENOUS

## 2018-01-17 MED ORDER — LIDOCAINE 2% (20 MG/ML) 5 ML SYRINGE
INTRAMUSCULAR | Status: DC | PRN
  Administered 2018-01-17: 100 mg via INTRAVENOUS

## 2018-01-17 MED ORDER — CEFAZOLIN SODIUM-DEXTROSE 2-4 GM/100ML-% IV SOLN
2.0000 g | INTRAVENOUS | Status: AC
  Administered 2018-01-17: 2 g via INTRAVENOUS

## 2018-01-17 MED ORDER — METOCLOPRAMIDE HCL 5 MG/ML IJ SOLN: 10.0000 mg | Freq: Four times a day (QID) | INTRAMUSCULAR | Status: DC

## 2018-01-17 MED ORDER — MIDAZOLAM HCL 2 MG/2ML IJ SOLN
INTRAMUSCULAR | Status: DC | PRN
  Administered 2018-01-17: 2 mg via INTRAVENOUS

## 2018-01-17 MED ORDER — HYDROMORPHONE HCL 2 MG/ML IJ SOLN
INTRAMUSCULAR | Status: AC
  Administered 2018-01-17: 13:00:00
  Filled 2018-01-17: qty 1

## 2018-01-17 MED ORDER — ROCURONIUM BROMIDE 50 MG/5ML IV SOLN
INTRAVENOUS | Status: AC
  Filled 2018-01-17: qty 1

## 2018-01-17 MED ORDER — MIDAZOLAM HCL 2 MG/2ML IJ SOLN: 0.5000 mg | Freq: Once | INTRAMUSCULAR | Status: DC | PRN

## 2018-01-17 MED ORDER — ACETAMINOPHEN 160 MG/5ML PO SOLN: 1000.0000 mg | Freq: Four times a day (QID) | ORAL | Status: AC

## 2018-01-17 MED ORDER — BUPIVACAINE 0.5 % ON-Q PUMP SINGLE CATH 400 ML
400.0000 mL | INJECTION | Status: AC
  Administered 2018-01-17: 400 mL
  Filled 2018-01-17: qty 400

## 2018-01-17 MED ORDER — PROPOFOL 10 MG/ML IV BOLUS
INTRAVENOUS | Status: DC | PRN
  Administered 2018-01-17: 130 mg via INTRAVENOUS

## 2018-01-17 MED ORDER — LACTATED RINGERS IV SOLN
INTRAVENOUS | Status: DC | PRN
  Administered 2018-01-17: 07:00:00 via INTRAVENOUS

## 2018-01-17 MED ORDER — PROPOFOL 10 MG/ML IV BOLUS
INTRAVENOUS | Status: AC
  Filled 2018-01-17: qty 20

## 2018-01-17 MED ORDER — PANTOPRAZOLE SODIUM 40 MG PO TBEC
40.0000 mg | DELAYED_RELEASE_TABLET | Freq: Every day | ORAL | Status: DC
  Administered 2018-01-17 – 2018-01-18 (×2): 40 mg via ORAL
  Filled 2018-01-17 (×2): qty 1

## 2018-01-17 MED ORDER — BISACODYL 5 MG PO TBEC
10.0000 mg | DELAYED_RELEASE_TABLET | Freq: Every day | ORAL | Status: DC
  Administered 2018-01-19 – 2018-01-28 (×8): 10 mg via ORAL
  Filled 2018-01-17 (×9): qty 2

## 2018-01-17 MED ORDER — KETOROLAC TROMETHAMINE 15 MG/ML IJ SOLN
15.0000 mg | Freq: Four times a day (QID) | INTRAMUSCULAR | Status: AC | PRN
Start: 2018-01-17 — End: 2018-01-22
  Administered 2018-01-17 – 2018-01-19 (×5): 15 mg via INTRAVENOUS
  Filled 2018-01-17 (×5): qty 1

## 2018-01-17 MED ORDER — LEVALBUTEROL HCL 0.63 MG/3ML IN NEBU
0.6300 mg | INHALATION_SOLUTION | Freq: Four times a day (QID) | RESPIRATORY_TRACT | Status: DC
  Administered 2018-01-17 – 2018-01-18 (×3): 0.63 mg via RESPIRATORY_TRACT
  Filled 2018-01-17 (×3): qty 3

## 2018-01-17 MED ORDER — METOCLOPRAMIDE HCL 5 MG/ML IJ SOLN
10.0000 mg | Freq: Four times a day (QID) | INTRAMUSCULAR | Status: AC
  Administered 2018-01-17 – 2018-01-20 (×11): 10 mg via INTRAVENOUS
  Filled 2018-01-17 (×11): qty 2

## 2018-01-17 MED ORDER — SODIUM CHLORIDE 0.9 % IV SOLN: Freq: Once | INTRAVENOUS | Status: DC

## 2018-01-17 MED ORDER — POTASSIUM CHLORIDE 10 MEQ/50ML IV SOLN
10.0000 meq | Freq: Every day | INTRAVENOUS | Status: DC | PRN
  Filled 2018-01-17: qty 50

## 2018-01-17 MED ORDER — DIPHENHYDRAMINE HCL 50 MG/ML IJ SOLN: 12.5000 mg | Freq: Four times a day (QID) | INTRAMUSCULAR | Status: DC | PRN

## 2018-01-17 MED ORDER — MIDAZOLAM HCL 2 MG/2ML IJ SOLN
INTRAMUSCULAR | Status: AC
  Filled 2018-01-17: qty 2

## 2018-01-17 MED ORDER — LIDOCAINE 2% (20 MG/ML) 5 ML SYRINGE
INTRAMUSCULAR | Status: AC
  Filled 2018-01-17: qty 5

## 2018-01-17 SURGICAL SUPPLY — 99 items
ADH SKN CLS APL DERMABOND .7 (GAUZE/BANDAGES/DRESSINGS)
APL SRG 22X2 LUM MLBL SLNT (VASCULAR PRODUCTS)
APL SRG 7X2 LUM MLBL SLNT (VASCULAR PRODUCTS) ×1
APPLICATOR TIP COSEAL (VASCULAR PRODUCTS) ×2 IMPLANT
APPLICATOR TIP EXT COSEAL (VASCULAR PRODUCTS) IMPLANT
BAG DECANTER FOR FLEXI CONT (MISCELLANEOUS) IMPLANT
BLADE SURG 11 STRL SS (BLADE) ×3 IMPLANT
CANISTER SUCT 3000ML PPV (MISCELLANEOUS) ×3 IMPLANT
CATH KIT ON Q 5IN SLV (PAIN MANAGEMENT) ×2 IMPLANT
CATH THORACIC 28FR (CATHETERS) ×2 IMPLANT
CATH THORACIC 36FR (CATHETERS) IMPLANT
CATH THORACIC 36FR RT ANG (CATHETERS) IMPLANT
CLIP VESOCCLUDE MED 24/CT (CLIP) ×2 IMPLANT
CLIP VESOCCLUDE MED 6/CT (CLIP) ×2 IMPLANT
CONN ST 1/4X3/8  BEN (MISCELLANEOUS) ×2
CONN ST 1/4X3/8 BEN (MISCELLANEOUS) IMPLANT
CONN Y 3/8X3/8X3/8  BEN (MISCELLANEOUS)
CONN Y 3/8X3/8X3/8 BEN (MISCELLANEOUS) IMPLANT
CONT SPEC 4OZ CLIKSEAL STRL BL (MISCELLANEOUS) ×6 IMPLANT
COVER SURGICAL LIGHT HANDLE (MISCELLANEOUS) ×2 IMPLANT
DERMABOND ADVANCED (GAUZE/BANDAGES/DRESSINGS)
DERMABOND ADVANCED .7 DNX12 (GAUZE/BANDAGES/DRESSINGS) IMPLANT
DRAIN CHANNEL 32F RND 10.7 FF (WOUND CARE) ×2 IMPLANT
DRAPE LAPAROSCOPIC ABDOMINAL (DRAPES) ×3 IMPLANT
DRAPE WARM FLUID 44X44 (DRAPE) ×1 IMPLANT
DRSG COVADERM 4X8 (GAUZE/BANDAGES/DRESSINGS) ×2 IMPLANT
ELECT BLADE 4.0 EZ CLEAN MEGAD (MISCELLANEOUS)
ELECT BLADE 6.5 EXT (BLADE) ×2 IMPLANT
ELECT REM PT RETURN 9FT ADLT (ELECTROSURGICAL) ×3
ELECTRODE BLDE 4.0 EZ CLN MEGD (MISCELLANEOUS) IMPLANT
ELECTRODE REM PT RTRN 9FT ADLT (ELECTROSURGICAL) ×1 IMPLANT
FELT TEFLON 1X6 (MISCELLANEOUS) ×2 IMPLANT
GAUZE SPONGE 4X4 12PLY STRL (GAUZE/BANDAGES/DRESSINGS) ×3 IMPLANT
GLOVE BIO SURGEON STRL SZ7.5 (GLOVE) ×6 IMPLANT
GOWN STRL REUS W/ TWL LRG LVL3 (GOWN DISPOSABLE) ×3 IMPLANT
GOWN STRL REUS W/TWL LRG LVL3 (GOWN DISPOSABLE) ×9
HANDLE STAPLE ENDO GIA SHORT (STAPLE)
HEMOSTAT SURGICEL 2X14 (HEMOSTASIS) IMPLANT
KIT BASIN OR (CUSTOM PROCEDURE TRAY) ×3 IMPLANT
KIT SUCTION CATH 14FR (SUCTIONS) ×3 IMPLANT
KIT TURNOVER KIT B (KITS) ×3 IMPLANT
NS IRRIG 1000ML POUR BTL (IV SOLUTION) ×10 IMPLANT
PACK CHEST (CUSTOM PROCEDURE TRAY) ×3 IMPLANT
PAD ARMBOARD 7.5X6 YLW CONV (MISCELLANEOUS) ×9 IMPLANT
PASSER SUT SWANSON 36MM LOOP (INSTRUMENTS) ×2 IMPLANT
POWDER SURGICEL 3.0 GRAM (HEMOSTASIS) ×2 IMPLANT
RELOAD STAPLE 35X2.5 WHT THIN (STAPLE) IMPLANT
RELOAD STAPLE 45 4.1 GRN THCK (STAPLE) IMPLANT
RELOAD STAPLE 45 GOLD REG/THCK (STAPLE) IMPLANT
SEALANT PROGEL (MISCELLANEOUS) IMPLANT
SEALANT SURG COSEAL 4ML (VASCULAR PRODUCTS) IMPLANT
SEALANT SURG COSEAL 8ML (VASCULAR PRODUCTS) ×2 IMPLANT
SOLUTION ANTI FOG 6CC (MISCELLANEOUS) ×3 IMPLANT
SPONGE INTESTINAL PEANUT (DISPOSABLE) ×2 IMPLANT
SPONGE TONSIL 1 RF SGL (DISPOSABLE) ×4 IMPLANT
SPONGE TONSIL 1.25 RF SGL STRG (GAUZE/BANDAGES/DRESSINGS) ×3 IMPLANT
STAPLE RELOAD 2.5MM WHITE (STAPLE) ×12 IMPLANT
STAPLE RELOAD 45 GRN (STAPLE) ×1 IMPLANT
STAPLE RELOAD 45MM GOLD (STAPLE) ×12 IMPLANT
STAPLE RELOAD 45MM GREEN (STAPLE) ×3
STAPLER ECHELON POWERED (MISCELLANEOUS) ×2 IMPLANT
STAPLER ENDO GIA 12 SHRT THIN (STAPLE) ×1 IMPLANT
STAPLER ENDO GIA 12MM SHORT (STAPLE) IMPLANT
STAPLER TA30 4.8 NON-ABS (STAPLE) IMPLANT
STAPLER VASCULAR ECHELON 35 (CUTTER) ×2 IMPLANT
SUT CHROMIC 3 0 SH 27 (SUTURE) IMPLANT
SUT ETHIBOND 2 0 SH (SUTURE) ×6
SUT ETHIBOND 2 0 SH 36X2 (SUTURE) IMPLANT
SUT ETHILON 3 0 PS 1 (SUTURE) IMPLANT
SUT PROLENE 3 0 SH DA (SUTURE) IMPLANT
SUT PROLENE 4 0 RB 1 (SUTURE) ×6
SUT PROLENE 4-0 RB1 .5 CRCL 36 (SUTURE) IMPLANT
SUT PROLENE 6 0 C 1 30 (SUTURE) IMPLANT
SUT SILK  1 MH (SUTURE) ×6
SUT SILK 1 MH (SUTURE) ×2 IMPLANT
SUT SILK 1 TIES 10X30 (SUTURE) IMPLANT
SUT SILK 2 0SH CR/8 30 (SUTURE) ×2 IMPLANT
SUT SILK 3 0SH CR/8 30 (SUTURE) IMPLANT
SUT VIC AB 1 CTX 18 (SUTURE) ×4 IMPLANT
SUT VIC AB 2 TP1 27 (SUTURE) IMPLANT
SUT VIC AB 2-0 CT1 27 (SUTURE) ×3
SUT VIC AB 2-0 CT1 TAPERPNT 27 (SUTURE) IMPLANT
SUT VIC AB 2-0 CTX 36 (SUTURE) ×2 IMPLANT
SUT VIC AB 3-0 SH 18 (SUTURE) IMPLANT
SUT VIC AB 3-0 X1 27 (SUTURE) IMPLANT
SUT VICRYL 0 UR6 27IN ABS (SUTURE) IMPLANT
SUT VICRYL 2 TP 1 (SUTURE) ×2 IMPLANT
SWAB COLLECTION DEVICE MRSA (MISCELLANEOUS) IMPLANT
SWAB CULTURE ESWAB REG 1ML (MISCELLANEOUS) IMPLANT
SYR 10ML LL (SYRINGE) ×3 IMPLANT
SYSTEM SAHARA CHEST DRAIN ATS (WOUND CARE) ×3 IMPLANT
TAPE CLOTH SURG 4X10 WHT LF (GAUZE/BANDAGES/DRESSINGS) ×2 IMPLANT
TIP APPLICATOR SPRAY EXTEND 16 (VASCULAR PRODUCTS) IMPLANT
TOWEL GREEN STERILE (TOWEL DISPOSABLE) ×3 IMPLANT
TOWEL GREEN STERILE FF (TOWEL DISPOSABLE) ×3 IMPLANT
TRAP SPECIMEN MUCOUS 40CC (MISCELLANEOUS) IMPLANT
TRAY FOLEY MTR SLVR 16FR STAT (SET/KITS/TRAYS/PACK) ×3 IMPLANT
TUNNELER SHEATH ON-Q 11GX8 DSP (PAIN MANAGEMENT) ×2 IMPLANT
WATER STERILE IRR 1000ML POUR (IV SOLUTION) ×6 IMPLANT

## 2018-01-17 NOTE — Anesthesia Procedure Notes (Signed)
Central Venous Catheter Insertion Performed by: Catalina Gravel, MD, anesthesiologist Start/End5/11/2017 7:00 AM, 01/17/2018 7:10 AM Patient location: Pre-op. Preanesthetic checklist: patient identified, IV checked, site marked, risks and benefits discussed, surgical consent, monitors and equipment checked, pre-op evaluation, timeout performed and anesthesia consent Position: Trendelenburg Lidocaine 1% used for infiltration and patient sedated Hand hygiene performed , maximum sterile barriers used  and Seldinger technique used Catheter size: 8 Fr Total catheter length 16. Central line was placed.Double lumen Procedure performed using ultrasound guided technique. Ultrasound Notes:anatomy identified, needle tip was noted to be adjacent to the nerve/plexus identified, no ultrasound evidence of intravascular and/or intraneural injection and image(s) printed for medical record Attempts: 1 Following insertion, dressing applied, line sutured and Biopatch. Post procedure assessment: blood return through all ports, free fluid flow and no air  Patient tolerated the procedure well with no immediate complications.

## 2018-01-17 NOTE — Anesthesia Procedure Notes (Signed)
Arterial Line Insertion Start/End5/11/2017 7:00 AM, 01/17/2018 7:00 AM Performed by: CRNA  Patient location: Pre-op. Preanesthetic checklist: patient identified, IV checked, site marked, risks and benefits discussed, surgical consent, monitors and equipment checked, pre-op evaluation, timeout performed and anesthesia consent Lidocaine 1% used for infiltration Right, radial was placed Catheter size: 20 Fr Hand hygiene performed  and maximum sterile barriers used   Attempts: 1 Procedure performed without using ultrasound guided technique. Following insertion, dressing applied. Post procedure assessment: normal and unchanged

## 2018-01-17 NOTE — Progress Notes (Signed)
      [address]       [place] [address]             [phone]      S/p Left lower lobectomy  BP (!) 165/74   Pulse (!) 51   Temp 97.9 F (36.6 C) (Oral)   Resp 10   Ht 6' (1.829 m)   Wt 126 lb 15.8 oz (57.6 kg)   SpO2 100%   BMI 17.22 kg/m    Intake/Output Summary (Last 24 hours) at 01/17/2018 1950 Last data filed at 01/17/2018 1900 Gross per 24 hour  Intake 1851.25 ml  Output 1255 ml  Net 596.25 ml   Some incisional pain encouraged to use PCA as needed  Remo Lipps C. Roxan Hockey, MD [facility] [phone]

## 2018-01-17 NOTE — Anesthesia Procedure Notes (Signed)
Procedure Name: Intubation Date/Time: 01/17/2018 7:46 AM Performed by: Clearnce Sorrel, CRNA Pre-anesthesia Checklist: Patient identified, Emergency Drugs available, Suction available, Patient being monitored and Timeout performed Patient Re-evaluated:Patient Re-evaluated prior to induction Oxygen Delivery Method: Circle system utilized Preoxygenation: Pre-oxygenation with 100% oxygen Induction Type: IV induction Ventilation: Mask ventilation without difficulty Laryngoscope Size: Mac and 4 Grade View: Grade I Tube type: Oral Endobronchial tube: Left, EBT position confirmed by fiberoptic bronchoscope and Double lumen EBT and 39 Fr Number of attempts: 1 Airway Equipment and Method: Stylet Placement Confirmation: ETT inserted through vocal cords under direct vision,  positive ETCO2 and breath sounds checked- equal and bilateral Tube secured with: Tape Dental Injury: Teeth and Oropharynx as per pre-operative assessment

## 2018-01-17 NOTE — Progress Notes (Signed)
Pre Procedure note for inpatients:   Curtis Henry has been scheduled for Procedure(s): VIDEO ASSISTED THORACOSCOPY (VATS)/ LOBECTOMY (Left) today. The various methods of treatment have been discussed with the patient. After consideration of the risks, benefits and treatment options the patient has consented to the planned procedure.   The patient has been seen and labs reviewed. There are no changes in the patient's condition to prevent proceeding with the planned procedure today.  Recent labs:  Lab Results  Component Value Date   WBC 6.1 01/15/2018   HGB 13.6 01/15/2018   HCT 40.5 01/15/2018   PLT 171 01/15/2018   GLUCOSE 92 01/15/2018   CHOL 232 (H) 12/12/2016   TRIG 97 12/12/2016   HDL 50 12/12/2016   LDLCALC 163 (H) 12/12/2016   ALT 23 01/15/2018   AST 32 01/15/2018   NA 139 01/15/2018   K 4.1 01/15/2018   CL 103 01/15/2018   CREATININE 1.02 01/15/2018   BUN 5 (L) 01/15/2018   CO2 25 01/15/2018   INR 1.06 01/15/2018    Ivin Poot III, MD 01/17/2018 7:13 AM

## 2018-01-17 NOTE — Transfer of Care (Signed)
Immediate Anesthesia Transfer of Care Note  Patient: Curtis Henry  Procedure(s) Performed: left VIDEO ASSISTED THORACOSCOPY (VATS) with left lower lobe wedge resection and LOBECTOMY (Left Chest)  Patient Location: PACU  Anesthesia Type:General  Level of Consciousness: awake and patient cooperative  Airway & Oxygen Therapy: Patient Spontanous Breathing and Patient connected to nasal cannula oxygen  Post-op Assessment: Report given to RN and Post -op Vital signs reviewed and stable  Post vital signs: Reviewed and stable  Last Vitals:  Vitals Value Taken Time  BP    Temp    Pulse 50 01/17/2018 11:59 AM  Resp 16 01/17/2018 11:59 AM  SpO2 99 % 01/17/2018 11:59 AM  Vitals shown include unvalidated device data.  Last Pain:  Vitals:   01/17/18 0547  TempSrc: Oral         Complications: No apparent anesthesia complications

## 2018-01-17 NOTE — Brief Op Note (Addendum)
01/17/2018  11:07 AM  PATIENT:  Curtis Henry  62 y.o. male  PRE-OPERATIVE DIAGNOSIS:  lung lesion  POST-OPERATIVE DIAGNOSIS:  lung lesion  PROCEDURE:  Procedure(s):  LEFT VIDEO ASSISTED THORACOSCOPY  MINI THORACOTOMY -Wedge Resection Left Lower Lobe -Completion Lobectomy Left Lower Lobe -Insertion of On-Q Anesthetic Catheter  SURGEON:  Surgeon(s) and Role:    Ivin Poot, MD - Primary  PHYSICIAN ASSISTANT: Ellwood Handler PA-C  ANESTHESIA:   general  EBL:  25 mL   BLOOD ADMINISTERED:none  DRAINS: 28 Blake, and Straight Chest tube   LOCAL MEDICATIONS USED:  MARCAINE     SPECIMEN:  Source of Specimen:  Wedge Resection LLL, Completion Left Lower Lobe  DISPOSITION OF SPECIMEN:  PATHOLOGY  COUNTS:  YES  TOURNIQUET:  * No tourniquets in log *  DICTATION: .Dragon Dictation  PLAN OF CARE: Admit to inpatient   PATIENT DISPOSITION:  ICU - extubated and stable.   Delay start of Pharmacological VTE agent (>24hrs) due to surgical blood loss or risk of bleeding: no

## 2018-01-17 NOTE — Anesthesia Postprocedure Evaluation (Signed)
Anesthesia Post Note  Patient: Curtis Henry  Procedure(s) Performed: left VIDEO ASSISTED THORACOSCOPY (VATS) with left lower lobe wedge resection and LOBECTOMY (Left Chest)     Patient location during evaluation: PACU Anesthesia Type: General Level of consciousness: awake and alert Pain control: pt hurting, will continue analgesia. Vital Signs Assessment: post-procedure vital signs reviewed and stable Respiratory status: spontaneous breathing, nonlabored ventilation, respiratory function stable and patient connected to nasal cannula oxygen Cardiovascular status: blood pressure returned to baseline and stable Postop Assessment: no apparent nausea or vomiting Anesthetic complications: no    Last Vitals:  Vitals:   01/17/18 1619 01/17/18 1625  BP:    Pulse:    Resp:    Temp:    SpO2: 100% 100%    Last Pain:  Vitals:   01/17/18 1616  TempSrc:   PainSc: 10-Worst pain ever                 Logan Baltimore,E. Makenlee Mckeag

## 2018-01-17 NOTE — Op Note (Signed)
NAME: Curtis Henry, Curtis Henry MEDICAL RECORD HQ:[id number] ACCOUNT [id number] DATE OF BIRTH:1956/02/02 FACILITY: [facility] LOCATION: [facility] PHYSICIAN:PETER VAN TRIGT III, MD  OPERATIVE REPORT  DATE OF PROCEDURE:  01/17/2018  PROCEDURES: 1.  Left VATS, excisional biopsy of left lower lobe nodule. 2.  Left lower lobe lobectomy for squamous cell of the lung. 3.  Placement of ON-Q wound analgesia system.    SURGEON:  Ivin Poot, III, MD  ASSISTANT:  Ellwood Handler, PA-C  PREOPERATIVE DIAGNOSIS:  A 2 cm hypermetabolic left lower lobe nodule, history of smoking.  POSTOPERATIVE DIAGNOSIS:  A 2 cm hypermetabolic left lower lobe nodule, history of smoking.  ANESTHESIA:  General by Annye Asa, MD.  CLINICAL NOTE:  The patient is a 62 year old male smoker.  He underwent a screening CT scan several months ago and was found to have a small left lower lobe nodule.  A followup CT scan 4 months later showed the nodule to have increased in size from 8 mm  to 1.2 mm with a spiculated margin suspicious for carcinoma.  A subsequent PET scan showed this to have metabolic activity with SUV of 7.0.  The patient was referred for surgical evaluation.  I reviewed his CT scan and PET scan, and recommended surgical  resection without prior biopsy because of the high suspicion of malignancy.  He underwent a head CT scan to finish clinical staging, which was negative for metastatic disease.  Preoperative PFTs were adequate with FVC 4.3 and FEV1 2.6, adequate for  lobectomy.  I saw the patient back in the office a second time to discuss surgery including the use of general anesthesia, the location of the surgical incision, and the expected postoperative hospital recovery.  We discussed the risks to him of the  operation including the risks of bleeding, infection, prolonged air leak, postoperative pulmonary problems including pleural effusion and pneumothorax, postoperative infection, and death.  He demonstrated his  understanding and agreed to proceed with  surgery under what I felt was an informed consent.  OPERATIVE FINDINGS:  The left lower lobe nodule was small and somewhat difficult to localize, but it was wedged out and found to be a squamous cell carcinoma on frozen section.  For that reason, we proceeded with a formal lobectomy.  DESCRIPTION OF PROCEDURE:  The patient was brought to the operating room and placed supine on the operating room table.  General anesthesia was induced.  The patient had been previously evaluated and seen in the preop holding area where the proper site  was marked and all questions addressed.  Informed consent was documented.  The patient was intubated with a double lumen endotracheal tube and turned left side up.  The left chest was prepped and draped as a sterile field.  A proper time-out was performed.  A small incision was made at the tip of the scapula in the 6th  interspace and a camera was inserted.  The lungs were fairly well collapsed with a 2 lumen anesthesia technique.  There is no visual sign of cancer.  The lungs did have changes from chronic smoking inhalation.  An incision was then made in the 6th interspace approximately 5-6 cm in length.  Through this small incision the left lower lobe was inspected and palpated.  The nodular density was palpated in the basilar segment anteriorly and this segment was wedged  out and sent for frozen section.  The inferior ligament was taken down to mobilize the left lower lobe.  Report from the frozen section came  back squamous cell carcinoma.  Decision was made to proceed with formal lobectomy.  The fissure between the upper lobe and lower lobe was opened and the pulmonary artery and its branches were carefully dissected out completely.  The branches to the lower lobe were circled with vessel loops and divided and stapled with the vascular  stapling device.  The fissures were completed using the endoscopic vascular  loads.  Next, the inferior vein was dissected.  There was no evidence that the upper lobe vein was anomalous or was communicating with the lower lobe vein.  The lower vein was encircled with a vessel loop and stapled and divided with a vascular endoscopic  stapler.  Next, the bronchus was dissected and encircled with a vessel loop and the endoscopic stapling device was used to staple off the lower lobe bronchus.  The specimen was removed.  Hemostasis was achieved.  The pleural space was irrigated with warm saline.  Medical adhesive - CoSeal was placed on the staple lines to reduce air leak.  Two chest tubes were placed anteriorly and posteriorly in the pleural space and brought out through  separate incisions.  The ribs were reapproximated with pericostal sutures.  The muscle layer was closed in 2 layers using interrupted #1 Vicryl.  The subcutaneous layer was closed with a running Vicryl and the skin was closed with a subcuticular.  An ON-Q catheter was placed underneath the main incision and above the chest tube sites, tunneled to its complete length, secured to the skin and connected to a Marcaine reservoir of 0.5% Marcaine.  The patient was then turned on his back, extubated and observed in the operating room for a period of time before being taken to the postanesthesia care unit.  He tolerated the procedure well with minimal blood loss.  GN/NUANCE  D:01/17/2018 T:01/17/2018 JOB:[id number]

## 2018-01-18 ENCOUNTER — Encounter (HOSPITAL_COMMUNITY): Payer: Self-pay | Admitting: Cardiothoracic Surgery

## 2018-01-18 ENCOUNTER — Inpatient Hospital Stay (HOSPITAL_COMMUNITY): Payer: BLUE CROSS/BLUE SHIELD

## 2018-01-18 DIAGNOSIS — I483 Typical atrial flutter: Secondary | ICD-10-CM

## 2018-01-18 LAB — POCT I-STAT 3, ART BLOOD GAS (G3+)
Acid-base deficit: 1 mmol/L (ref 0.0–2.0)
Bicarbonate: 24.4 mmol/L (ref 20.0–28.0)
Bicarbonate: 25.9 mmol/L (ref 20.0–28.0)
O2 Saturation: 98 %
O2 Saturation: 98 %
Patient temperature: 97.8
Patient temperature: 98.7
TCO2: 26 mmol/L (ref 22–32)
TCO2: 27 mmol/L (ref 22–32)
pCO2 arterial: 43 mmHg (ref 32.0–48.0)
pCO2 arterial: 44.6 mmHg (ref 32.0–48.0)
pH, Arterial: 7.36 (ref 7.350–7.450)
pH, Arterial: 7.373 (ref 7.350–7.450)
pO2, Arterial: 111 mmHg — ABNORMAL HIGH (ref 83.0–108.0)
pO2, Arterial: 112 mmHg — ABNORMAL HIGH (ref 83.0–108.0)

## 2018-01-18 LAB — BASIC METABOLIC PANEL
ANION GAP: 9 (ref 5–15)
BUN: 8 mg/dL (ref 6–20)
CO2: 25 mmol/L (ref 22–32)
Calcium: 8.4 mg/dL — ABNORMAL LOW (ref 8.9–10.3)
Chloride: 99 mmol/L — ABNORMAL LOW (ref 101–111)
Creatinine, Ser: 0.94 mg/dL (ref 0.61–1.24)
GFR calc Af Amer: >60 mL/min (ref >60)
GFR calc non Af Amer: >60 mL/min (ref >60)
GLUCOSE: 145 mg/dL — AB (ref 65–99)
POTASSIUM: 4.1 mmol/L (ref 3.5–5.1)
Sodium: 133 mmol/L — ABNORMAL LOW (ref 135–145)

## 2018-01-18 LAB — CBC
HEMATOCRIT: 36.3 % — AB (ref 39.0–52.0)
Hemoglobin: 11.9 g/dL — ABNORMAL LOW (ref 13.0–17.0)
MCH: 27.9 pg (ref 26.0–34.0)
MCHC: 32.8 g/dL (ref 30.0–36.0)
MCV: 85 fL (ref 78.0–100.0)
Platelets: 182 10*3/uL (ref 150–400)
RBC: 4.27 MIL/uL (ref 4.22–5.81)
RDW: 13.9 % (ref 11.5–15.5)
WBC: 11.7 10*3/uL — AB (ref 4.0–10.5)

## 2018-01-18 MED ORDER — SODIUM CHLORIDE 0.9% FLUSH
10.0000 mL | Freq: Two times a day (BID) | INTRAVENOUS | Status: DC
  Administered 2018-01-18 – 2018-01-27 (×11): 10 mL

## 2018-01-18 MED ORDER — AMIODARONE HCL IN DEXTROSE 360-4.14 MG/200ML-% IV SOLN
30.0000 mg/h | INTRAVENOUS | Status: DC
  Administered 2018-01-18 – 2018-01-23 (×10): 30 mg/h via INTRAVENOUS
  Filled 2018-01-18 (×11): qty 200

## 2018-01-18 MED ORDER — AMIODARONE HCL IN DEXTROSE 360-4.14 MG/200ML-% IV SOLN
60.0000 mg/h | INTRAVENOUS | Status: AC
  Administered 2018-01-18 (×2): 60 mg/h via INTRAVENOUS
  Filled 2018-01-18: qty 200

## 2018-01-18 MED ORDER — LEVALBUTEROL HCL 0.63 MG/3ML IN NEBU: 0.6300 mg | INHALATION_SOLUTION | Freq: Four times a day (QID) | RESPIRATORY_TRACT | Status: DC | PRN

## 2018-01-18 MED ORDER — METOPROLOL TARTRATE 5 MG/5ML IV SOLN
INTRAVENOUS | Status: AC
  Administered 2018-01-18: 2.5 mg via INTRAVENOUS
  Filled 2018-01-18: qty 5

## 2018-01-18 MED ORDER — ENOXAPARIN SODIUM 40 MG/0.4ML ~~LOC~~ SOLN
40.0000 mg | SUBCUTANEOUS | Status: DC
  Administered 2018-01-18: 40 mg via SUBCUTANEOUS
  Filled 2018-01-18: qty 0.4

## 2018-01-18 MED ORDER — CHLORHEXIDINE GLUCONATE CLOTH 2 % EX PADS
6.0000 | MEDICATED_PAD | Freq: Every day | CUTANEOUS | Status: DC
  Administered 2018-01-19 – 2018-01-27 (×7): 6 via TOPICAL

## 2018-01-18 MED ORDER — METOPROLOL TARTRATE 5 MG/5ML IV SOLN
2.5000 mg | Freq: Once | INTRAVENOUS | Status: AC
  Administered 2018-01-18: 2.5 mg via INTRAVENOUS

## 2018-01-18 MED ORDER — AMIODARONE LOAD VIA INFUSION
150.0000 mg | Freq: Once | INTRAVENOUS | Status: AC
  Administered 2018-01-18: 150 mg via INTRAVENOUS
  Filled 2018-01-18: qty 83.34

## 2018-01-18 MED ORDER — SODIUM CHLORIDE 0.9% FLUSH: 10.0000 mL | INTRAVENOUS | Status: DC | PRN

## 2018-01-18 NOTE — Progress Notes (Signed)
1 Day Post-Op Procedure(s) (LRB): left VIDEO ASSISTED THORACOSCOPY (VATS) with left lower lobe wedge resection and LOBECTOMY (Left) Subjective: C/o irritation from Foley Denies pain Nausea with emesis x 1 this AM  Objective: Vital signs in last 24 hours: Temp:  [97.6 F (36.4 C)-98 F (36.7 C)] 97.8 F (36.6 C) (05/04 0400) Pulse Rate:  [46-75] 54 (05/04 0600) Cardiac Rhythm: Normal sinus rhythm;Sinus bradycardia (05/03 2015) Resp:  [8-29] 22 (05/04 0737) BP: (121-187)/(67-116) 142/100 (05/04 0700) SpO2:  [97 %-100 %] 100 % (05/04 0737) Arterial Line BP: (129-214)/(57-102) 144/83 (05/04 0700) Weight:  [125 lb 10.6 oz (57 kg)-126 lb 15.8 oz (57.6 kg)] 125 lb 10.6 oz (57 kg) (05/04 0600)  Hemodynamic parameters for last 24 hours:    Intake/Output from previous day: 05/03 0701 - 05/04 0700 In: 3451.3 [P.O.:840; I.V.:2411.3; IV Piggyback:200] Out: 6553 [Urine:2480; Blood:25; Chest Tube:1200] Intake/Output this shift: No intake/output data recorded.  General appearance: alert, cooperative and no distress Neurologic: intact Heart: tachycardic regular Lungs: clear to auscultation bilaterally Abdomen: normal findings: soft, non-tender no air leak  Lab Results: Recent Labs    01/15/18 1430 01/18/18 0452  WBC 6.1 11.7*  HGB 13.6 11.9*  HCT 40.5 36.3*  PLT 171 182   BMET:  Recent Labs    01/15/18 1430 01/18/18 0452  NA 139 133*  K 4.1 4.1  CL 103 99*  CO2 25 25  GLUCOSE 92 145*  BUN 5* 8  CREATININE 1.02 0.94  CALCIUM 9.1 8.4*    PT/INR:  Recent Labs    01/15/18 1430  LABPROT 13.7  INR 1.06   ABG    Component Value Date/Time   PHART 7.373 01/18/2018 0459   HCO3 25.9 01/18/2018 0459   TCO2 27 01/18/2018 0459   ACIDBASEDEF 1.0 01/18/2018 0418   O2SAT 98.0 01/18/2018 0459   CBG (last 3)  No results for input(s): GLUCAP in the last 72 hours.  Assessment/Plan: S/P Procedure(s) (LRB): left VIDEO ASSISTED THORACOSCOPY (VATS) with left lower lobe wedge  resection and LOBECTOMY (Left) -POD # 1 CV- tachycardic this AM- appears to be atrial flutter with 2:1 conduction- given IV lopressor  Will start amiodarone  D/w Dr.Hochrein of Cardiology- they will see in consultation  RESP- no air leak- CT to water seal, IS, change nebs to PRN  RENAL- creatinine and lytes OK  GI- some nausea this AM, advance diet as tolerated  SCD + enoxaparin for DVT prophylaxis   LOS: 1 day    Melrose Nakayama 01/18/2018

## 2018-01-18 NOTE — Progress Notes (Signed)
      [address]       [address],[place] [address]             [phone]      Resting  Still has some nausea  BP 127/76   Pulse (!) 54   Temp 98.1 F (36.7 C) (Oral)   Resp 19   Ht 6' (1.829 m)   Wt 125 lb 10.6 oz (57 kg)   SpO2 100%   BMI 17.04 kg/m   Intake/Output Summary (Last 24 hours) at 01/18/2018 1734 Last data filed at 01/18/2018 1600 Gross per 24 hour  Intake 3751.5 ml  Output 3250 ml  Net 501.5 ml   Doing well POD # 1  Fausto Sampedro C. Roxan Hockey, MD [facility] [phone]

## 2018-01-18 NOTE — Progress Notes (Signed)
  Amiodarone Drug - Drug Interaction Consult Note  Recommendations: None, monitor for now  Amiodarone is metabolized by the cytochrome P450 system and therefore has the potential to cause many drug interactions. Amiodarone has an average plasma half-life of 50 days (range 20 to 100 days).   There is potential for drug interactions to occur several weeks or months after stopping treatment and the onset of drug interactions may be slow after initiating amiodarone.   [x]  Statins: Increased risk of myopathy. Simvastatin- restrict dose to 20mg  daily. Other statins: counsel patients to report any muscle pain or weakness immediately.  [x]  Anticoagulants: Amiodarone can increase anticoagulant effect. Consider warfarin dose reduction. Patients should be monitored closely and the dose of anticoagulant altered accordingly, remembering that amiodarone levels take several weeks to stabilize.  []  Antiepileptics: Amiodarone can increase plasma concentration of phenytoin, the dose should be reduced. Note that small changes in phenytoin dose can result in large changes in levels. Monitor patient and counsel on signs of toxicity.  []  Beta blockers: increased risk of bradycardia, AV block and myocardial depression. Sotalol - avoid concomitant use.  []   Calcium channel blockers (diltiazem and verapamil): increased risk of bradycardia, AV block and myocardial depression.  []   Cyclosporine: Amiodarone increases levels of cyclosporine. Reduced dose of cyclosporine is recommended.  []  Digoxin dose should be halved when amiodarone is started.  []  Diuretics: increased risk of cardiotoxicity if hypokalemia occurs.  []  Oral hypoglycemic agents (glyburide, glipizide, glimepiride): increased risk of hypoglycemia. Patient's glucose levels should be monitored closely when initiating amiodarone therapy.   []  Drugs that prolong the QT interval:  Torsades de pointes risk may be increased with concurrent use - avoid if  possible.  Monitor QTc, also keep magnesium/potassium WNL if concurrent therapy can't be avoided. [name] Antibiotics: e.g. fluoroquinolones, erythromycin. . Antiarrhythmics: e.g. quinidine, procainamide, disopyramide, sotalol. . Antipsychotics: e.g. phenothiazines, haloperidol.  . Lithium, tricyclic antidepressants, and methadone. Thank You,  Pat Patrick  01/18/2018 11:12 AM

## 2018-01-18 NOTE — Consult Note (Signed)
Cardiology Consultation:   Patient ID: Curtis Henry; [id number]; Mar 25, 1956   Admit date: 01/17/2018 Date of Consult: 01/18/2018  Primary Care Provider: Rogue Bussing, MD Primary Cardiologist: No primary care provider on file.   (Distant saw Dr. Irish Lack)    Patient Profile:   Curtis Henry is a 62 y.o. male with a hx of lung nodule admitted for lobectomy/VATS who is being seen today for the evaluation of atrial flutter at the request of Dr. Roxan Hockey.  History of Present Illness:   Curtis Henry has had lobectomy for resection of a left lower lobe lung nodule/squamous cell lung CA.  He has been doing well post op but this morning he was noted to have a rapid narrow complex tachycardia consistent with atrial flutter.  He does not feel this.   He does not recall any prior cardiac history.  He works full time though most of it is not physical.  He can do physical activity and does not have any cardiovascular complaints.  The patient denies any new symptoms such as chest discomfort, neck or arm discomfort. There has been no new shortness of breath, PND or orthopnea. There have been no reported palpitations, presyncope or syncope.     He has some incisional pain but denies severe symptoms.  He has no cough, fever or chills.  Currently on amiodarone with some improvement in his rate.   Past Medical History:  Diagnosis Date  . Bursitis of left shoulder   . GERD (gastroesophageal reflux disease)   . Hyperlipidemia   . Restless leg syndrome     Past Surgical History:  Procedure Laterality Date  . CIRCUMCISION  12/13/2003  . COLONOSCOPY    . VIDEO ASSISTED THORACOSCOPY (VATS)/ LOBECTOMY Left 01/17/2018   Procedure: left VIDEO ASSISTED THORACOSCOPY (VATS) with left lower lobe wedge resection and LOBECTOMY;  Surgeon: Ivin Poot, MD;  Location: [facility] OR;  Service: Thoracic;  Laterality: Left;     Home Medications:  Prior to Admission medications   Medication Sig Start Date End  Date Taking? Authorizing Provider  atorvastatin (LIPITOR) 40 MG tablet Take 1 tablet (40 mg total) by mouth daily. 12/13/16  Yes Rogue Bussing, MD  betamethasone valerate ointment (VALISONE) 0.1 % Apply 1 application 2 (two) times daily topically. Patient taking differently: Apply 1 application topically 2 (two) times daily as needed (irritation).  08/01/17  Yes Hensel, Jamal Collin, MD  fish oil-omega-3 fatty acids 1000 MG capsule Take 2 g by mouth daily.   Yes [provider]  gabapentin (NEURONTIN) 600 MG tablet Take 1 tablet (600 mg total) by mouth at bedtime. 12/19/17  Yes Rogue Bussing, MD  Multiple Vitamin (MULTIVITAMIN) tablet Take 1 tablet by mouth daily.   Yes [provider]  omeprazole (PRILOSEC) 20 MG capsule Take 20 mg by mouth daily as needed.    Yes [provider]  umeclidinium bromide (INCRUSE ELLIPTA) 62.5 MCG/INH AEPB Inhale 1 puff into the lungs daily. 12/19/17  Yes Rogue Bussing, MD    Inpatient Medications: Scheduled Meds: . acetaminophen  1,000 mg Oral Q6H   Or  . acetaminophen (TYLENOL) oral liquid 160 mg/5 mL  1,000 mg Oral Q6H  . atorvastatin  40 mg Oral q1800  . bisacodyl  10 mg Oral Daily  . enoxaparin (LOVENOX) injection  40 mg Subcutaneous Q24H  . fentaNYL   Intravenous Q4H  . gabapentin  600 mg Oral QHS  . mouth rinse  15 mL Mouth Rinse BID  .  metoCLOPramide (REGLAN) injection  10 mg Intravenous Q6H  . pantoprazole  40 mg Oral Daily  . senna-docusate  1 tablet Oral QHS  . umeclidinium bromide  1 puff Inhalation Daily   Continuous Infusions: . sodium chloride 75 mL/hr at 01/18/18 1200  . amiodarone 60 mg/hr (01/18/18 1202)   Followed by  . amiodarone    . potassium chloride     PRN Meds: diphenhydrAMINE **OR** diphenhydrAMINE, ketorolac, levalbuterol, naloxone **AND** sodium chloride flush, ondansetron (ZOFRAN) IV, oxyCODONE, potassium chloride, traMADol  Allergies:    Allergies  Allergen  Reactions  . Lactose Intolerance (Gi) Nausea Only and Other (See Comments)    Unsettled bowel.     Social History:   Social History   Socioeconomic History  . Marital status: Married    Spouse name: Not on file  . Number of children: Not on file  . Years of education: Not on file  . Highest education level: Not on file  Occupational History  . Occupation: runs [employer] [place]  . Financial resource strain: Not on file  . Food insecurity:    Worry: Not on file    Inability: Not on file  . Transportation needs:    Medical: Not on file    Non-medical: Not on file  Tobacco Use  . Smoking status: Current Every Day Smoker    Packs/day: 0.10    Years: 42.00    Pack years: 4.20    Types: E-cigarettes, Cigars    Start date: 09/17/1974  . Smokeless tobacco: Never Used  . Tobacco comment: trying to quit all of it  Substance and Sexual Activity  . Alcohol use: No    Comment: occ.  . Drug use: Yes    Frequency: 7.0 times per week    Types: Marijuana    Comment: crack use distant past /weed yesterday-pt will stop until surgery  . Sexual activity: Not on file  Lifestyle  . Physical activity:    Days per week: Not on file    Minutes per session: Not on file  . Stress: Not on file  Relationships  . Social connections:    Talks on phone: Not on file    Gets together: Not on file    Attends religious service: Not on file    Active member of club or organization: Not on file    Attends meetings of clubs or organizations: Not on file    Relationship status: Not on file  . Intimate partner violence:    Fear of current or ex partner: Not on file    Emotionally abused: Not on file    Physically abused: Not on file    Forced sexual activity: Not on file  Other Topics Concern  . Not on file  Social History Narrative  . Not on file    Family History:    Family History  Problem Relation Age of Onset  . Esophageal cancer Brother   . Colon cancer Father 17  .  Heart disease Brother   . Arthritis Unknown      ROS:  Please see the history of present illness.  ROS  All other ROS reviewed and negative.     Physical Exam/Data:   Vitals:   01/18/18 1100 01/18/18 1200 01/18/18 1205 01/18/18 1219  BP: (!) 122/108 (!) 145/100    Pulse: (!) 118 (!) 125    Resp: (!) 21 (!) 24 (!) 22   Temp:    97.8 F (36.6  C)  TempSrc:    Oral  SpO2: 100% 97% 100%   Weight:      Height:        Intake/Output Summary (Last 24 hours) at 01/18/2018 1304 Last data filed at 01/18/2018 1200 Gross per 24 hour  Intake 3417.3 ml  Output 3790 ml  Net -372.7 ml   Filed Weights   01/17/18 1400 01/18/18 0600  Weight: 126 lb 15.8 oz (57.6 kg) 125 lb 10.6 oz (57 kg)   Body mass index is 17.04 kg/m.  GENERAL:  Wee appearing and thin HEENT:   Pupils equal round and reactive, fundi not visualized, oral mucosa unremarkable NECK:  No  jugular venous distention, waveform within normal limits, carotid upstroke brisk. and symmetric, no bruits, no thyromegaly, poor dentition LYMPHATICS:  No cervical, inguinal adenopathy LUNGS:     Decreased breath sounds BACK:  No CVA tenderness CHEST:  Chest tubes in place HEART:  PMI not displaced or sustained,S1 and S2 within normal limits, no S3, no clicks, no rubs,  murmurs ABD:  Flat, positive bowel sounds normal in frequency in pitch, no bruits, no rebound, no guarding, no midline pulsatile mass, no hepatomegaly, no splenomegaly EXT:  2 plus pulses throughout, no  edema, no cyanosis no clubbing SKIN:  No rashes no nodules NEURO:   Cranial nerves II through XII grossly intact, motor grossly intact throughout PSYCH:    Cognitively intact, oriented to person place and time   EKG:  The EKG was personally reviewed and demonstrates:  Atrial flutter with 2:1 conduction. Telemetry:  Telemetry was personally reviewed and demonstrates:  Flutter with variable conduction  Relevant CV Studies: None  Laboratory Data:  Chemistry Recent Labs    Lab 01/15/18 1430 01/18/18 0452  NA 139 133*  K 4.1 4.1  CL 103 99*  CO2 25 25  GLUCOSE 92 145*  BUN 5* 8  CREATININE 1.02 0.94  CALCIUM 9.1 8.4*  GFRNONAA >60 >60  GFRAA >60 >60  ANIONGAP 11 9    Recent Labs  Lab 01/15/18 1430  PROT 6.7  ALBUMIN 3.5  AST 32  ALT 23  ALKPHOS 50  BILITOT 0.7   Hematology Recent Labs  Lab 01/15/18 1430 01/18/18 0452  WBC 6.1 11.7*  RBC 4.74 4.27  HGB 13.6 11.9*  HCT 40.5 36.3*  MCV 85.4 85.0  MCH 28.7 27.9  MCHC 33.6 32.8  RDW 13.8 13.9  PLT 171 182   Cardiac EnzymesNo results for input(s): TROPONINI in the last 168 hours. No results for input(s): TROPIPOC in the last 168 hours.  BNPNo results for input(s): BNP, PROBNP in the last 168 hours.  DDimer No results for input(s): DDIMER in the last 168 hours.  Radiology/Studies:  Dg Chest 2 View  Result Date: 01/16/2018 CLINICAL DATA:  Preoperative examination prior to surgery for lung nodule on the left. Positive smoking history. History of COPD and gastroesophageal reflux EXAM: CHEST - 2 VIEW COMPARISON:  PET-CT study of January 07, 2018. FINDINGS: The lungs are hyperinflated. There is a stable irregularly marginated nodule which projects in the left lower lobe. Elsewhere the lungs are clear. The heart and pulmonary vascularity are normal. The mediastinum is normal in width. The bony thorax exhibits no acute abnormality. IMPRESSION: Abnormal left lower lobe lung nodule. Underlying COPD. No pneumonia, CHF, nor other acute cardiopulmonary abnormality. Electronically Signed   By: David  Martinique M.D.   On: 01/16/2018 08:15   Dg Chest Port 1 View  Result Date: 01/18/2018 CLINICAL DATA:  62 year old  male status post lobectomy. Followup study. EXAM: PORTABLE CHEST 1 VIEW COMPARISON:  Chest x-ray 01/17/2018. FINDINGS: Right internal jugular central venous catheter with tip misdirected into the right axillary vein. Postoperative changes of recent left lower lobectomy are noted. There are 2 left-sided  chest tubes in position, both with tips directed into the apex of the left hemithorax. Trace left pneumothorax noted. Compensatory hyperexpansion of the left upper lobe. Right lung is clear. No pleural effusions. IMPRESSION: 1. Postoperative changes and support apparatus, as detailed above. Please take note of the misdirected position of the tip of the right internal jugular central venous catheter which is likely within a right axillary vein. 2. Trace left pneumothorax with 2 left-sided chest tubes in place following left lower lobectomy. These results will be called to the ordering clinician or representative by the Radiologist Assistant, and communication documented in the PACS or zVision Dashboard. Electronically Signed   By: Vinnie Langton M.D.   On: 01/18/2018 08:14   Dg Chest Port 1 View  Result Date: 01/17/2018 CLINICAL DATA:  Status post left lobectomy. EXAM: PORTABLE CHEST 1 VIEW COMPARISON:  01/15/2018. FINDINGS: Interval left hemithorax volume loss with mediastinal shift to the left and surgical clips. A left chest tube is in place with its tip at the left lung apex. There is an additional tube overlying the more medial aspect of the left hemithorax. No pneumothorax seen. Normal sized heart. Mildly prominent interstitial markings. The lungs are mildly hyperexpanded. Unremarkable bones. IMPRESSION: 1. Status post left thoracotomy with no pneumothorax. 2. Stable mild changes of COPD. Electronically Signed   By: Claudie Revering M.D.   On: 01/17/2018 12:44    Assessment and Plan:   ATRIAL FLUTTER:  Typical flutter on EKG.  No symptoms.   Amiodarone bolus given and now on drip.  I suspect that he will convert to sinus with this.  Continue current therapy.  We can use PO or IV Cardizem if rate sustains at 2:1 though he is comfortable and I think this will likely not be needed.  Further management will be based on whether or not he is still in flutter in the AM.  I suspect that he will not need or amio.   Low embolic risk so at this point we don't need to consider anticoagulation.   HTN:  BP slightly elevated but he says this is unusual.  No change in therapy.       For questions or updates, please contact [place] Please consult [email or web address] for contact info under Cardiology/STEMI.   Signed, Minus Breeding, MD  01/18/2018 1:04 PM

## 2018-01-19 ENCOUNTER — Inpatient Hospital Stay (HOSPITAL_COMMUNITY): Payer: BLUE CROSS/BLUE SHIELD

## 2018-01-19 LAB — CBC
HCT: 20.7 % — ABNORMAL LOW (ref 39.0–52.0)
HEMATOCRIT: 18.8 % — AB (ref 39.0–52.0)
HEMATOCRIT: 28.5 % — AB (ref 39.0–52.0)
HEMOGLOBIN: 9.9 g/dL — AB (ref 13.0–17.0)
Hemoglobin: 7 g/dL — ABNORMAL LOW (ref 13.0–17.0)
Hemoglobin: 6.4 g/dL — CL (ref 13.0–17.0)
MCH: 28.3 pg (ref 26.0–34.0)
MCH: 28.4 pg (ref 26.0–34.0)
MCH: 28.8 pg (ref 26.0–34.0)
MCHC: 33.8 g/dL (ref 30.0–36.0)
MCHC: 34 g/dL (ref 30.0–36.0)
MCHC: 34.7 g/dL (ref 30.0–36.0)
MCV: 83.8 fL (ref 78.0–100.0)
MCV: 83.6 fL (ref 78.0–100.0)
MCV: 82.8 fL (ref 78.0–100.0)
Platelets: 127 10*3/uL — ABNORMAL LOW (ref 150–400)
Platelets: 128 10*3/uL — ABNORMAL LOW (ref 150–400)
Platelets: 117 10*3/uL — ABNORMAL LOW (ref 150–400)
RBC: 2.47 MIL/uL — AB (ref 4.22–5.81)
RBC: 2.25 MIL/uL — ABNORMAL LOW (ref 4.22–5.81)
RBC: 3.44 MIL/uL — AB (ref 4.22–5.81)
RDW: 13.7 % (ref 11.5–15.5)
RDW: 13.6 % (ref 11.5–15.5)
RDW: 14 % (ref 11.5–15.5)
WBC: 11 10*3/uL — ABNORMAL HIGH (ref 4.0–10.5)
WBC: 12.2 10*3/uL — AB (ref 4.0–10.5)
WBC: 11.1 10*3/uL — ABNORMAL HIGH (ref 4.0–10.5)

## 2018-01-19 LAB — COMPREHENSIVE METABOLIC PANEL
ALT: 18 U/L (ref 17–63)
ANION GAP: 4 — AB (ref 5–15)
AST: 46 U/L — ABNORMAL HIGH (ref 15–41)
Albumin: 2.4 g/dL — ABNORMAL LOW (ref 3.5–5.0)
Alkaline Phosphatase: 33 U/L — ABNORMAL LOW (ref 38–126)
BUN: 11 mg/dL (ref 6–20)
CHLORIDE: 95 mmol/L — AB (ref 101–111)
CO2: 30 mmol/L (ref 22–32)
CREATININE: 1.16 mg/dL (ref 0.61–1.24)
Calcium: 7.8 mg/dL — ABNORMAL LOW (ref 8.9–10.3)
GFR calc non Af Amer: >60 mL/min (ref >60)
Glucose, Bld: 123 mg/dL — ABNORMAL HIGH (ref 65–99)
POTASSIUM: 4.6 mmol/L (ref 3.5–5.1)
SODIUM: 129 mmol/L — AB (ref 135–145)
Total Bilirubin: 0.5 mg/dL (ref 0.3–1.2)
Total Protein: 4.7 g/dL — ABNORMAL LOW (ref 6.5–8.1)

## 2018-01-19 LAB — PREPARE RBC (CROSSMATCH)

## 2018-01-19 LAB — PROTIME-INR
INR: 1.17
Prothrombin Time: 14.8 seconds (ref 11.4–15.2)

## 2018-01-19 LAB — APTT: aPTT: 33 seconds (ref 24–36)

## 2018-01-19 MED ORDER — SODIUM CHLORIDE 0.9 % IV SOLN
INTRAVENOUS | Status: DC
  Administered 2018-01-19 – 2018-01-20 (×3): via INTRAVENOUS

## 2018-01-19 MED ORDER — PROMETHAZINE HCL 25 MG/ML IJ SOLN: 6.2500 mg | Freq: Four times a day (QID) | INTRAMUSCULAR | Status: DC | PRN

## 2018-01-19 MED ORDER — FUROSEMIDE 10 MG/ML IJ SOLN
20.0000 mg | Freq: Once | INTRAMUSCULAR | Status: AC
  Administered 2018-01-19: 20 mg via INTRAVENOUS
  Filled 2018-01-19: qty 2

## 2018-01-19 MED ORDER — FAMOTIDINE IN NACL 20-0.9 MG/50ML-% IV SOLN
20.0000 mg | Freq: Two times a day (BID) | INTRAVENOUS | Status: DC
  Administered 2018-01-19 – 2018-01-21 (×5): 20 mg via INTRAVENOUS
  Filled 2018-01-19 (×5): qty 50

## 2018-01-19 MED ORDER — SODIUM CHLORIDE 0.9 % IV SOLN
Freq: Once | INTRAVENOUS | Status: AC
  Administered 2018-01-19: 11:00:00 via INTRAVENOUS

## 2018-01-19 NOTE — Progress Notes (Signed)
2 Days Post-Op Procedure(s) (LRB): left VIDEO ASSISTED THORACOSCOPY (VATS) with left lower lobe wedge resection and LOBECTOMY (Left) Subjective: Still having nausea, light headed when sitting up  Objective: Vital signs in last 24 hours: Temp:  [97.8 F (36.6 C)-98.7 F (37.1 C)] 98.7 F (37.1 C) (05/05 0802) Pulse Rate:  [39-125] 59 (05/05 0730) Cardiac Rhythm: Normal sinus rhythm (05/05 0400) Resp:  [9-33] 13 (05/05 0730) BP: (105-169)/(46-108) 136/71 (05/05 0730) SpO2:  [87 %-100 %] 100 % (05/05 0730) Arterial Line BP: (145-147)/(84-88) 147/88 (05/04 1000) Weight:  [128 lb 12.8 oz (58.4 kg)] 128 lb 12.8 oz (58.4 kg) (05/05 0400)  Hemodynamic parameters for last 24 hours:    Intake/Output from previous day: 05/04 0701 - 05/05 0700 In: 3137 [P.O.:720; I.V.:2417] Out: 1505 [Urine:1025; Chest Tube:480] Intake/Output this shift: No intake/output data recorded.  General appearance: alert, cooperative and mild distress Neurologic: intact Heart: irregularly irregular rhythm Lungs: diminished breath sounds left base Abdomen: normal findings: soft, non-tender dark bloody drainage from CT  Lab Results: Recent Labs    01/18/18 0452 01/19/18 0403  WBC 11.7* 11.0*  HGB 11.9* 7.0*  HCT 36.3* 20.7*  PLT 182 127*   BMET:  Recent Labs    01/18/18 0452 01/19/18 0403  NA 133* 129*  K 4.1 4.6  CL 99* 95*  CO2 25 30  GLUCOSE 145* 123*  BUN 8 11  CREATININE 0.94 1.16  CALCIUM 8.4* 7.8*    PT/INR:  Recent Labs    01/19/18 0613  LABPROT 14.8  INR 1.17   ABG    Component Value Date/Time   PHART 7.373 01/18/2018 0459   HCO3 25.9 01/18/2018 0459   TCO2 27 01/18/2018 0459   ACIDBASEDEF 1.0 01/18/2018 0418   O2SAT 98.0 01/18/2018 0459   CBG (last 3)  No results for input(s): GLUCAP in the last 72 hours.  Assessment/Plan: S/P Procedure(s) (LRB): left VIDEO ASSISTED THORACOSCOPY (VATS) with left lower lobe wedge resection and LOBECTOMY (Left) POD # 2  CV- in SR  but frequent PACs- continue amiodarone, appreciate Cardiology's assistance  RESP- good oxygenation. Relatively high bloody output from CT. No air leak. Has a loculated hemothorax on CXR this AM  Keep both tubes in place  Anemia secondary to ABL- Hgb down significantly. Will repeat. May need transfusion- type and screen  Stop enoxaparin.  RENAL- creatinine OK, mild hyponatremia, change IVF to normal saline  GI- nausea persists, continue reglan     LOS: 2 days    Melrose Nakayama 01/19/2018

## 2018-01-19 NOTE — Progress Notes (Signed)
CRITICAL VALUE ALERT  Critical Value:  hgb 6.4  Date & Time Notied:  01/19/2018 1010  Provider Notified: Dr.Hhendrickson  Orders Received/Actions taken: Transfuse 2 units of RBC's

## 2018-01-19 NOTE — Progress Notes (Signed)
Patient asked to be transferred back to bed from chair. With 2 RN assist, patient stood up from the chair and attempted to slowly move to the bed. During this time, patient had a vagal response with a decrease in blood pressure, nausea, and pale in skin color. Both RNs assisted patient to bed successfully. Blood pressure increased and patient denies any further nausea, pain, and patient blood pressure increased. Patient resting in bed. Will continue to monitor patient.

## 2018-01-19 NOTE — Progress Notes (Signed)
Progress Note  Patient Name: Curtis Henry Date of Encounter: 01/19/2018  Primary Cardiologist:   Minus Breeding, MD    Subjective   Episode of low BP and nausea related to transfer from chair to bed.  Thought to be vagal.    Inpatient Medications    Scheduled Meds: . acetaminophen  1,000 mg Oral Q6H   Or  . acetaminophen (TYLENOL) oral liquid 160 mg/5 mL  1,000 mg Oral Q6H  . atorvastatin  40 mg Oral q1800  . bisacodyl  10 mg Oral Daily  . Chlorhexidine Gluconate Cloth  6 each Topical Daily  . enoxaparin (LOVENOX) injection  40 mg Subcutaneous Q24H  . fentaNYL   Intravenous Q4H  . gabapentin  600 mg Oral QHS  . mouth rinse  15 mL Mouth Rinse BID  . metoCLOPramide (REGLAN) injection  10 mg Intravenous Q6H  . pantoprazole  40 mg Oral Daily  . senna-docusate  1 tablet Oral QHS  . sodium chloride flush  10-40 mL Intracatheter Q12H  . umeclidinium bromide  1 puff Inhalation Daily   Continuous Infusions: . sodium chloride 75 mL/hr at 01/19/18 0404  . amiodarone 30 mg/hr (01/19/18 0629)  . potassium chloride     PRN Meds: diphenhydrAMINE **OR** diphenhydrAMINE, ketorolac, levalbuterol, naloxone **AND** sodium chloride flush, ondansetron (ZOFRAN) IV, oxyCODONE, potassium chloride, sodium chloride flush, traMADol   Vital Signs    Vitals:   01/19/18 0630 01/19/18 0700 01/19/18 0730 01/19/18 0802  BP: 136/69 128/63 136/71   Pulse: (!) 56 92 (!) 59   Resp: 10 15 13    Temp:    98.7 F (37.1 C)  TempSrc:    Oral  SpO2: 100% 100% 100%   Weight:      Height:        Intake/Output Summary (Last 24 hours) at 01/19/2018 0805 Last data filed at 01/19/2018 0700 Gross per 24 hour  Intake 2822 ml  Output 1505 ml  Net 1317 ml   Filed Weights   01/17/18 1400 01/18/18 0600 01/19/18 0400  Weight: 126 lb 15.8 oz (57.6 kg) 125 lb 10.6 oz (57 kg) 128 lb 12.8 oz (58.4 kg)    Telemetry    NSR with frequent ectopy and atrial tach.    - Personally Reviewed  ECG    NA -  Personally Reviewed  Physical Exam   GEN: No acute distress.  Nauseated Neck: No  JVD Cardiac: RRR, no murmurs, rubs, or gallops.  Respiratory:   Decreased breath sounds at the left base and left mid lung GI: Soft, nontender, non-distended  MS: No  edema; No deformity. Neuro:  Nonfocal  Psych: Normal affect   Labs    Chemistry Recent Labs  Lab 01/15/18 1430 01/18/18 0452 01/19/18 0403  NA 139 133* 129*  K 4.1 4.1 4.6  CL 103 99* 95*  CO2 25 25 30   GLUCOSE 92 145* 123*  BUN 5* 8 11  CREATININE 1.02 0.94 1.16  CALCIUM 9.1 8.4* 7.8*  PROT 6.7  --  4.7*  ALBUMIN 3.5  --  2.4*  AST 32  --  46*  ALT 23  --  18  ALKPHOS 50  --  33*  BILITOT 0.7  --  0.5  GFRNONAA >60 >60 >60  GFRAA >60 >60 >60  ANIONGAP 11 9 4*     Hematology Recent Labs  Lab 01/15/18 1430 01/18/18 0452 01/19/18 0403  WBC 6.1 11.7* 11.0*  RBC 4.74 4.27 2.47*  HGB 13.6 11.9* 7.0*  HCT 40.5 36.3* 20.7*  MCV 85.4 85.0 83.8  MCH 28.7 27.9 28.3  MCHC 33.6 32.8 33.8  RDW 13.8 13.9 13.7  PLT 171 182 127*    Cardiac EnzymesNo results for input(s): TROPONINI in the last 168 hours. No results for input(s): TROPIPOC in the last 168 hours.   BNPNo results for input(s): BNP, PROBNP in the last 168 hours.   DDimer No results for input(s): DDIMER in the last 168 hours.   Radiology    Dg Chest Port 1 View  Result Date: 01/19/2018 CLINICAL DATA:  Status post left lower lobectomy EXAM: PORTABLE CHEST 1 VIEW COMPARISON:  Chest radiograph from one day prior. FINDINGS: Stable malpositioned right internal jugular central venous catheter terminating over the right axilla. Stable position of 2 left apical chest tubes. Surgical clips overlie the left hilum. Stable cardiomediastinal silhouette with normal heart size. No pneumothorax. Increased pleural thickening in the peripheral mid to lower left pleural space. No right pleural effusion. No pulmonary edema. Decreased left lung base aeration with stable left  retrocardiac opacity. Clear right lung. IMPRESSION: 1. Stable malpositioned right internal jugular central venous catheter terminating over the right axilla. 2. No pneumothorax.  Two left apical chest tubes in place. 3. Increased pleural thickening in the peripheral mid to lower left pleural space, suggesting increased loculated effusion. 4. Associated decreased aeration at the left lung base with left retrocardiac opacity, probably atelectasis. Electronically Signed   By: Ilona Sorrel M.D.   On: 01/19/2018 07:38   Dg Chest Port 1 View  Result Date: 01/18/2018 CLINICAL DATA:  62 year old male status post lobectomy. Followup study. EXAM: PORTABLE CHEST 1 VIEW COMPARISON:  Chest x-ray 01/17/2018. FINDINGS: Right internal jugular central venous catheter with tip misdirected into the right axillary vein. Postoperative changes of recent left lower lobectomy are noted. There are 2 left-sided chest tubes in position, both with tips directed into the apex of the left hemithorax. Trace left pneumothorax noted. Compensatory hyperexpansion of the left upper lobe. Right lung is clear. No pleural effusions. IMPRESSION: 1. Postoperative changes and support apparatus, as detailed above. Please take note of the misdirected position of the tip of the right internal jugular central venous catheter which is likely within a right axillary vein. 2. Trace left pneumothorax with 2 left-sided chest tubes in place following left lower lobectomy. These results will be called to the ordering clinician or representative by the Radiologist Assistant, and communication documented in the PACS or zVision Dashboard. Electronically Signed   By: Vinnie Langton M.D.   On: 01/18/2018 08:14   Dg Chest Port 1 View  Result Date: 01/17/2018 CLINICAL DATA:  Status post left lobectomy. EXAM: PORTABLE CHEST 1 VIEW COMPARISON:  01/15/2018. FINDINGS: Interval left hemithorax volume loss with mediastinal shift to the left and surgical clips. A left chest  tube is in place with its tip at the left lung apex. There is an additional tube overlying the more medial aspect of the left hemithorax. No pneumothorax seen. Normal sized heart. Mildly prominent interstitial markings. The lungs are mildly hyperexpanded. Unremarkable bones. IMPRESSION: 1. Status post left thoracotomy with no pneumothorax. 2. Stable mild changes of COPD. Electronically Signed   By: Claudie Revering M.D.   On: 01/17/2018 12:44    Cardiac Studies   NA  Patient Profile     62 y.o. male with VATS, lobectomy for squamous cell CA.  We are asked to see because of new onset atrial flutter.  He had no prior cardiac history.  Assessment & Plan    ATRIAL FLUTTER:   Back in NSR yesterday afternoon with frequent atrial ectopy and possibly brief run of fib.  Continue IV amiodarone today as it would be likely that he would convert back to flutter with RVR without this until his acute nausea and overall picture improves.      HYPONATREMIA.  Likely SIADH related to lung disease/surgery.  Free water restriction remains mainstay of therapy.    ANEMIA:    Plan per surgery .  Probable intrathoracic bleed.  Consider transfusion with significant fall in Hbg  HTN:   Labile.  Continue current therapy.  Consider low dose beta blocker if HR or BP remain elevated.   For questions or updates, please contact [place] Please consult [email or web address] for contact info under Cardiology/STEMI.   Signed, Minus Breeding, MD  01/19/2018, 8:05 AM

## 2018-01-19 NOTE — Progress Notes (Signed)
      [address]       [place] [address]             [phone]      Feels a little better currently   BP 139/69   Pulse 63   Temp 98.5 F (36.9 C) (Oral)   Resp 14   Ht 6' (1.829 m)   Wt 128 lb 12.8 oz (58.4 kg)   SpO2 100%   BMI 17.47 kg/m   Intake/Output Summary (Last 24 hours) at 01/19/2018 1148 Last data filed at 01/19/2018 1100 Gross per 24 hour  Intake 2388.5 ml  Output 1805 ml  Net 583.5 ml   Has drained about 600 ml from CT  Repeat Hgb was 6.4- receiving transfusion  I suspect tubes are draining the hemothorax that was seen on his CXR this morning. He is not tachycardic or hypotensive to suggest ongoing hemorrhage. Will repeat CXR to make sure he is not accumulating additional blood in chest  Steven C. Roxan Hockey, MD [facility] [phone]

## 2018-01-19 NOTE — Progress Notes (Signed)
Notified Dr.Hendrickson that the patient has had 600 ml's out of his chest tube since 0700 and hat the chest x-ray from this am reported the right IJ central line was malpositioned over the right axilla. Will continue to monitor, no new orders.

## 2018-01-19 NOTE — Progress Notes (Signed)
      [address]       [place] [address]             [phone]      Nausea is better was able to eat some of his lunch  BP (!) 144/70 (BP Location: Right Arm)   Pulse 71   Temp 98.6 F (37 C) (Oral)   Resp 19   Ht 6' (1.829 m)   Wt 128 lb 12.8 oz (58.4 kg)   SpO2 96%   BMI 17.47 kg/m    Intake/Output Summary (Last 24 hours) at 01/19/2018 1833 Last data filed at 01/19/2018 1800 Gross per 24 hour  Intake 3975.47 ml  Output 3370 ml  Net 605.47 ml   Repeat Hgb pending  Looks better this PM  Remo Lipps C. Roxan Hockey, MD [facility] [phone]

## 2018-01-19 NOTE — Plan of Care (Signed)
  Problem: Education: Goal: Knowledge of General Education information will improve Outcome: Progressing   Problem: Activity: Goal: Risk for activity intolerance will decrease Outcome: Progressing   Problem: Nutrition: Goal: Adequate nutrition will be maintained Outcome: Progressing   Problem: Coping: Goal: Level of anxiety will decrease Outcome: Progressing   Problem: Pain Managment: Goal: General experience of comfort will improve Outcome: Progressing   Problem: Safety: Goal: Ability to remain free from injury will improve Outcome: Progressing   Problem: Skin Integrity: Goal: Risk for impaired skin integrity will decrease Outcome: Progressing   Problem: Education: Goal: Knowledge of disease or condition will improve Outcome: Progressing   Problem: Activity: Goal: Risk for activity intolerance will decrease Outcome: Progressing   Problem: Cardiac: Goal: Hemodynamic stability will improve Outcome: Progressing   Problem: Pain Management: Goal: Pain level will decrease Outcome: Progressing

## 2018-01-20 ENCOUNTER — Inpatient Hospital Stay (HOSPITAL_COMMUNITY): Payer: BLUE CROSS/BLUE SHIELD

## 2018-01-20 LAB — BASIC METABOLIC PANEL
Anion gap: 7 (ref 5–15)
BUN: 10 mg/dL (ref 6–20)
CO2: 30 mmol/L (ref 22–32)
CREATININE: 1.12 mg/dL (ref 0.61–1.24)
Calcium: 7.9 mg/dL — ABNORMAL LOW (ref 8.9–10.3)
Chloride: 99 mmol/L — ABNORMAL LOW (ref 101–111)
GFR calc non Af Amer: >60 mL/min (ref >60)
Glucose, Bld: 99 mg/dL (ref 65–99)
Potassium: 3.7 mmol/L (ref 3.5–5.1)
Sodium: 136 mmol/L (ref 135–145)

## 2018-01-20 LAB — TYPE AND SCREEN
ABO/RH(D): O POS
Antibody Screen: NEGATIVE
Unit division: 0
Unit division: 0

## 2018-01-20 LAB — CBC
HEMATOCRIT: 24.7 % — AB (ref 39.0–52.0)
HEMOGLOBIN: 8.6 g/dL — AB (ref 13.0–17.0)
MCH: 28.7 pg (ref 26.0–34.0)
MCHC: 34.8 g/dL (ref 30.0–36.0)
MCV: 82.3 fL (ref 78.0–100.0)
Platelets: 93 10*3/uL — ABNORMAL LOW (ref 150–400)
RBC: 3 MIL/uL — ABNORMAL LOW (ref 4.22–5.81)
RDW: 14.3 % (ref 11.5–15.5)
WBC: 8.6 10*3/uL (ref 4.0–10.5)

## 2018-01-20 LAB — BPAM RBC
Blood Product Expiration Date: 201906012359
Blood Product Expiration Date: 201906012359
ISSUE DATE / TIME: 201905051054
ISSUE DATE / TIME: 201905051437
Unit Type and Rh: 5100
Unit Type and Rh: 5100

## 2018-01-20 MED ORDER — METOPROLOL TARTRATE 25 MG PO TABS
25.0000 mg | ORAL_TABLET | Freq: Two times a day (BID) | ORAL | Status: DC
  Administered 2018-01-20 – 2018-01-23 (×5): 25 mg via ORAL
  Filled 2018-01-20 (×6): qty 1

## 2018-01-20 MED ORDER — POTASSIUM CHLORIDE 10 MEQ/50ML IV SOLN
10.0000 meq | INTRAVENOUS | Status: AC
  Administered 2018-01-20 (×4): 10 meq via INTRAVENOUS
  Filled 2018-01-20 (×4): qty 50

## 2018-01-20 MED ORDER — ENSURE ENLIVE PO LIQD
237.0000 mL | Freq: Three times a day (TID) | ORAL | Status: DC
  Administered 2018-01-21 – 2018-01-22 (×3): 237 mL via ORAL

## 2018-01-20 NOTE — Discharge Summary (Signed)
[address]       ,[place] [address]             [phone]      Physician Discharge Summary  Patient ID: Curtis Henry MRN: [id number] DOB/AGE: October 02, 1955 62 y.o.  Admit date: 01/17/2018 Discharge date: 01/28/2018  Admission Diagnoses: Patient Active Problem List   Diagnosis Date Noted  . COPD (chronic obstructive pulmonary disease) ([place]) 12/20/2017  . Abnormal CT lung screening 12/20/2017  . Heel callus 12/20/2017  . Poor dentition 03/19/2017  . Rash and nonspecific skin eruption 01/16/2017  . Rotator cuff tendinitis, left 12/26/2016  . Tobacco abuse 12/16/2016  . Leg cramps 12/16/2016  . Shoulder pain 12/16/2016  . HYPERCHOLESTEROLEMIA 11/14/2006  . IMPOTENCE INORGANIC 11/14/2006  . GASTROESOPHAGEAL REFLUX, NO ESOPHAGITIS 11/14/2006  . BURSITIS, SUBDELTOID 11/14/2006   Discharge Diagnoses:  Active Problems:   S/P lobectomy of lung   Discharged Condition: good  HPI:  Patient returns with PET scan to discuss recently diagnosed 1 cm left lower lobe irregular nodule showing increasing growth over the past several months with CT scans.  Activity on PET scan was hypermetabolic with SUV of 7.3.  There is no evidence of mediastinal nodal involvement or distant metastatic disease.  No other pulmonary nodules with hypermetabolic activity.  The patient has stopped smoking. Patient has had preoperative PFTs which are adequate for lobectomy. The patient will be prepared for surgery and have a head CT scan to complete clinical staging.  The patient will be scheduled for surgery, left VATS and left lower lobectomy on May 3 at [facility].  We discussed the indications and benefits of surgery as well as the alternatives and potential risks including the risks of bleeding, blood transfusion, prolonged air leak, postoperative pneumonia, MI, arrhythmias, death.  He agrees to proceed with surgery.   Hospital Course:  On Jan 17, 2018 Mr. Gahm underwent a  left video-assisted thorascopic surgery, excisional biopsy of the left lower lobe nodule, and left lower lobe lobectomy for squamous cell of the lung with Dr. Darcey Nora.  Postop day 1 he did have nausea with emesis x1.  We changed his chest tube to waterseal.  He was tachycardic and appearing to be in atrial flutter therefore we administered IV Lopressor and started amiodarone IV per protocol.  We consulted cardiology for assistance.  We initiated Lovenox for DVT prophylaxis.  Postop day 2 he converted to normal sinus rhythm but with frequent PACs.  We continued his chest tubes due to high bloody output.  He did have a loculated hemothorax on his chest x-ray.  We stopped Lovenox due to anemia and high bloody output from the chest tubes.  Postop day 3 his nausea improved.  He was stable to transfer to the telemetry unit for continued care.  His chest tubes did not have any air leak on waterseal.  He remained in normal sinus rhythm with PACs and cardiology continue to follow.  We will continue to hold Lovenox at this time. He continued to have an air leak with the remaining posterior chest tube to water seal. We monitored each day. We gave him some Toradol for his pain which seemed to help. Finally on 5/14 after clamping the chest tube we determined it would be safe to remove. We removed the chest tube and obtained an xray at noon. The xray was stable and the patient was determined appropriate for discharge. He will get his chest tube sutures removed during his  follow-up visit in 1 week.    Consults: None  Significant Diagnostic Studies:   CLINICAL DATA:  Status post left lower lobe wedge resection and lobectomy.  EXAM: PORTABLE CHEST 1 VIEW  COMPARISON:  Portable chest x-ray of Jan 19, 2018  FINDINGS: There remains an approximately 5-10% left apical pneumothorax. There remains considerable pleural thickening or loculated pleural fluid along the left mid and lower lateral chest wall. The 2 left  chest tubes are in stable position. The right internal jugular venous catheter tip is directed laterally into the subclavian vein and extends to the axilla. This is stable. The right lung is clear. The heart and pulmonary vascularity are normal.  IMPRESSION: Stable 5-10% left apical pneumothorax. Stable loculated pleural fluid laterally along the mid and lower left hemithorax. Stable appearing chest tubes.  Stable laterally directed right internal jugular venous catheter.   Electronically Signed   By: David  Martinique M.D.   On: 01/20/2018 09:44    Treatments:   NAME: SULIMAN, TERMINI MEDICAL RECORD [id number] ACCOUNT [id number] DATE OF BIRTH:12-09-1955 FACILITY: [facility] LOCATION: [facility] PHYSICIAN:PETER VAN TRIGT III, MD  OPERATIVE REPORT  DATE OF PROCEDURE:  01/17/2018  PROCEDURES: 1.  Left VATS, excisional biopsy of left lower lobe nodule. 2.  Left lower lobe lobectomy for squamous cell of the lung. 3.  Placement of ON-Q wound analgesia system.    SURGEON:  Ivin Poot, III, MD  ASSISTANT:  Ellwood Handler, PA-C  PREOPERATIVE DIAGNOSIS:  A 2 cm hypermetabolic left lower lobe nodule, history of smoking.  POSTOPERATIVE DIAGNOSIS:  A 2 cm hypermetabolic left lower lobe nodule, history of smoking.  ANESTHESIA:  General by Annye Asa, MD.    Discharge Exam: Blood pressure 120/83, pulse 80, temperature 98 F (36.7 C), temperature source Oral, resp. rate 18, height 6' (1.829 m), weight 128 lb 12.8 oz (58.4 kg), SpO2 100 %.   General appearance: alert, cooperative and no distress Heart: regular rate and rhythm, S1, S2 normal, no murmur, click, rub or gallop Lungs: clear to auscultation bilaterally Abdomen: soft, non-tender; bowel sounds normal; no masses,  no organomegaly Extremities: extremities normal, atraumatic, no cyanosis or edema Wound: clean and dry    Disposition: Discharge disposition: 01-Home or Self Care        Allergies  as of 01/28/2018      Reactions   Lactose Intolerance (gi) Nausea Only, Other (See Comments)   Unsettled bowel.       Medication List    TAKE these medications   amiodarone 200 MG tablet Commonly known as:  PACERONE Take 1 tablet (200 mg total) by mouth daily.   atorvastatin 40 MG tablet Commonly known as:  LIPITOR Take 1 tablet (40 mg total) by mouth daily.   betamethasone valerate ointment 0.1 % Commonly known as:  VALISONE Apply 1 application 2 (two) times daily topically. What changed:    when to take this  reasons to take this   feeding supplement (PRO-STAT SUGAR FREE 64) Liqd Take 30 mLs by mouth 2 (two) times daily.   fish oil-omega-3 fatty acids 1000 MG capsule Take 2 g by mouth daily.   gabapentin 600 MG tablet Commonly known as:  NEURONTIN Take 1 tablet (600 mg total) by mouth at bedtime.   metoprolol tartrate 25 MG tablet Commonly known as:  LOPRESSOR Take 0.5 tablets (12.5 mg total) by mouth 2 (two) times daily.   multivitamin tablet Take 1 tablet by mouth daily.   omeprazole 20 MG capsule Commonly known as:  PRILOSEC Take 20 mg by mouth daily as needed.   oxyCODONE 5 MG immediate release tablet Commonly known as:  Oxy IR/ROXICODONE Take 1 tablet (5 mg total) by mouth every 6 (six) hours as needed for severe pain.   umeclidinium bromide 62.5 MCG/INH Aepb Commonly known as:  INCRUSE ELLIPTA Inhale 1 puff into the lungs daily.            Durable Medical Equipment  (From admission, onward)        Start     Ordered   01/28/18 0748  For home use only DME Walker rolling  Once    Question:  Patient needs a walker to treat with the following condition  Answer:  Debility   01/28/18 0747     Follow-up Information    Ivin Poot, MD Follow up on 02/05/2018.   Specialty:  Cardiothoracic Surgery Why:  Appointment is at 2:30, please get CXR at 2:00 at [facility] located on first floor of our office building Contact information: [place] [address] [phone]        Rogue Bussing, MD. Call in 1 day(s).   Specialty:  Family Medicine Contact information: [place] [address] [phone]        Minus Breeding, MD .   Specialty:  Cardiology Contact information: [phone] [address] [place] [address] [phone]           Signed: Elgie Collard 01/28/2018, 10:16 AM

## 2018-01-20 NOTE — Discharge Instructions (Signed)
Thoracotomy, Care After This sheet gives you information about how to care for yourself after your procedure. Your health care provider may also give you more specific instructions. If you have problems or questions, contact your health care provider. What can I expect after the procedure? After your procedure, it is common to have:  Pain and swelling around the incision area.  Pain when you breathe in (inhale).  Constipation.  Fatigue.  Loss of appetite.  Trouble sleeping.  Mood swings and depression.  Follow these instructions at home: Preventing pneumonia  Take deep breaths or do breathing exercises as instructed by your health care provider.  Cough frequently. Coughing may cause discomfort, but it is important to clear mucus (phlegm) and expand your lungs. If coughing hurts, hold a pillow against your chest or place both hands flat on top of the incision (splinting) when you cough. This may help relieve discomfort.  Continue to use an incentive spirometer as directed. This is a tool that measures how well you fill your lungs with each breath.  Participate in pulmonary rehabilitation as directed. This is a program that combines education, exercise, and support from a team of specialists. The goal is to help you heal and return to normal activities as soon as possible. Medicines  Take over-the-counter or prescription medicines only as told by your health care provider.  If you have pain, take pain-relieving medicine before your pain becomes severe. This is important because if your pain is under control, you will be able to breathe and cough more comfortably.  If you were prescribed an antibiotic medicine, take it as told by your health care provider. Do not stop taking the antibiotic even if you start to feel better. Activity  Ask your health care provider what activities are safe for you.  Do not travel by airplane for 2 weeks after your chest tube is removed, or until  your health care provider says that this is safe.  Do not lift anything that is heavier than 10 lb (4.5 kg), or the limit that your health care provider tells you, until he or she says that it is safe.  Do not drive until your health care provider approves. ? Do not drive or use heavy machinery while taking prescription pain medicine. Incision care  Follow instructions from your health care provider about how to take care of your incision. Make sure you: ? Wash your hands with soap and water before you change your bandage (dressing). If soap and water are not available, use hand sanitizer. ? Change your dressing as told by your health care provider. ? Leave stitches (sutures), skin glue, or adhesive strips in place. These skin closures may need to stay in place for 2 weeks or longer. If adhesive strip edges start to loosen and curl up, you may trim the loose edges. Do not remove adhesive strips completely unless your health care provider tells you to do that.  Keep your dressing dry.  Check your incision area every day for signs of infection. Check for: ? More redness, swelling, or pain. ? More fluid or blood. ? Warmth. ? Pus or a bad smell. Bathing  Do not take baths, swim, or use a hot tub until your health care provider approves. You may take showers.  After your dressing has been removed, use soap and water to gently wash your incision area. Do not use anything else to clean your incision unless your health care provider tells you to do that. Eating and  drinking  Eat a healthy diet as instructed by your health care provider. A healthy diet includes plenty of fresh fruits and vegetables, whole grains, and low-fat (lean) proteins.  Drink enough fluid to keep your urine clear or pale yellow. General instructions  To prevent or treat constipation while you are taking prescription pain medicine, your health care provider may recommend that you: ? Take over-the-counter or prescription  medicines. ? Eat foods that are high in fiber, such as fresh fruits and vegetables, whole grains, and beans. ? Limit foods that are high in fat and processed sugars, such as fried and sweet foods.  Do not use any products that contain nicotine or tobacco, such as cigarettes and e-cigarettes. If you need help quitting, ask your health care provider.  Avoid secondhand smoke.  Wear compression stockings as told by your health care provider. These stockings help to prevent blood clots and reduce swelling in your legs.  If you have a chest tube, care for it as instructed.  Keep all follow-up visits as told by your health care provider. This is important. Contact a health care provider if:  You have more redness, swelling, or pain around your incision.  You have more fluid or blood coming from your incision.  Your incision feels warm to the touch.  You have pus or a bad smell coming from your incision.  You have a fever or chills.  Your heartbeat seems irregular.  You have nausea or vomiting.  You have muscle aches.  You are constipated. This may mean that you have: ? Fewer bowel movements in a week than normal. ? Difficulty having a bowel movement. ? Stools that are dry, hard, or larger than normal. Get help right away if:  You develop a rash.  You feel light-headed or feel like you are going to faint.  You have shortness of breath or trouble breathing.  You are confused.  You have trouble speaking.  You have vision problems.  You are not able to move.  You have numbness in your face, arms, or legs.  You lose consciousness.  You have a sudden, severe headache.  You feel weak.  You have chest pain.  You have pain that: ? Is severe. ? Gets worse, even with medicine. Summary  To prevent pneumonia, take deep breaths, do breathing exercises, and cough frequently, as instructed by your health care provider.  Do not drive until your health care provider  approves. Do not travel by airplane for 2 weeks after your chest tube is removed, or until your health care provider approves.  Check your incision area every day for signs of infection.  Eat a healthy diet that includes plenty of fresh fruits and vegetables, whole grains, and low-fat (lean) proteins. This information is not intended to replace advice given to you by your health care provider. Make sure you discuss any questions you have with your health care provider. Document Released: 02/16/2011 Document Revised: 05/28/2016 Document Reviewed: 05/28/2016 Elsevier Interactive Patient Education  2017 [employer].

## 2018-01-20 NOTE — Progress Notes (Signed)
Initial Nutrition Assessment  DOCUMENTATION CODES:   Underweight  INTERVENTION:  Ensure Enlive po TID, each supplement provides 350 kcal and 20 grams of protein RD to monitor PO intake.  NUTRITION DIAGNOSIS:   Increased nutrient needs related to cancer and cancer related treatments, post-op healing, chronic illness as evidenced by estimated needs.  GOAL:   Patient will meet greater than or equal to 90% of their needs  MONITOR:   PO intake, Supplement acceptance, Weight trends, Labs, I & O's, Skin  REASON FOR ASSESSMENT:   Consult, Other (Comment)(Low BMI) Poor PO, Assessment of nutrition requirement/status, Wound healing  ASSESSMENT:   62 y.o. M admitted on 01/17/18 for lobectomy of lung now post-op. PMH of lung cancer, hyperlipidemia, GERD. Quit smoking 1 month PTA (smoked for 42 years) and current marijuana.   Per chart pt completing 50% of meals.  Pt reports eating well PTA; pt eats "the same type of food I eat here" and eats "a good portion"; more than two fistfuls of food at each meal. Pt reports he has been this weight for 5-6 years and has been small all of his life; he weighed the most he ever did in high school when he played football at 151 lbs. Pt also reports his father was just as small, never weighing more than 140 lbs.   Medications reviewed: fentanyl, neurontin, lopressor, senokot, pepcid, potassium chloride.   Labs reviewed: Cl 99 (L), RBC 3 (L), hemoglobin 8.6 (L), HCT 24.7 (L), platelets 93 (L).    Intake/Output Summary (Last 24 hours) at 01/20/2018 1642 Last data filed at 01/20/2018 1600 Gross per 24 hour  Intake 2935.8 ml  Output 2835 ml  Net 100.8 ml   Pt with difficult NFPE; with pt history unable to diagnose with malnutrition at this time. Pt at high risk for malnutrition due to lack of muscle and fat reserves as well as lung cancer diagnosis. Will continue to monitor PO intake.   NUTRITION - FOCUSED PHYSICAL EXAM:    Most Recent Value  Orbital  Region  No depletion  Upper Arm Region  No depletion  Thoracic and Lumbar Region  No depletion  Buccal Region  Moderate depletion  Temple Region  Mild depletion  Clavicle Bone Region  No depletion  Clavicle and Acromion Bone Region  Moderate depletion  Scapular Bone Region  Severe depletion  Dorsal Hand  No depletion  Patellar Region  Mild depletion  Anterior Thigh Region  Moderate depletion  Posterior Calf Region  Moderate depletion  Edema (RD Assessment)  None  Hair  Reviewed  Eyes  Reviewed  Mouth  Reviewed  Skin  Reviewed  Nails  Reviewed       Diet Order:   Diet Order           Diet regular Room service appropriate? Yes with Assist; Fluid consistency: Thin  Diet effective now          EDUCATION NEEDS:   Education needs have been addressed  Skin:  Skin Assessment: Reviewed RN Assessment  Last BM:  01/20/18  Height:   Ht Readings from Last 1 Encounters:  01/17/18 6' (1.829 m)    Weight:   Wt Readings from Last 1 Encounters:  01/19/18 128 lb 12.8 oz (58.4 kg)   UBW: 124-130 lbs since 2018.  %UBW: 100%  Ideal Body Weight:  80.9 kg  BMI:  Body mass index is 17.47 kg/m.  Estimated Nutritional Needs:   Kcal:  1800-2000 kcal  Protein:  85-100 grams  Fluid:  1.8-2 L or per MD    Hope Budds, Dietetic Intern

## 2018-01-20 NOTE — Progress Notes (Signed)
3 Days Post-Op Procedure(s) (LRB): left VIDEO ASSISTED THORACOSCOPY (VATS) with left lower lobe wedge resection and LOBECTOMY (Left) Subjective: Nausea improved C/o pain from CT  Objective: Vital signs in last 24 hours: Temp:  [97.5 F (36.4 C)-99.2 F (37.3 C)] 97.5 F (36.4 C) (05/06 0400) Pulse Rate:  [38-114] 38 (05/06 0700) Cardiac Rhythm: Normal sinus rhythm (05/06 0400) Resp:  [11-27] 11 (05/06 0700) BP: (107-169)/(57-109) 169/79 (05/06 0700) SpO2:  [93 %-100 %] 100 % (05/06 0700)  Hemodynamic parameters for last 24 hours:    Intake/Output from previous day: 05/05 0701 - 05/06 0700 In: 4300.5 [P.O.:1078; I.V.:2512.5; Blood:660; IV Piggyback:50] Out: 1572 [IOMBT:5974; Chest Tube:970] Intake/Output this shift: No intake/output data recorded.  General appearance: alert, cooperative and no distress Neurologic: intact Heart: irregularly irregular rhythm Lungs: diminished breath sounds left base, no wheezing Wound: clean and dry no air leak, serosanguinous drainage from CT  Lab Results: Recent Labs    01/19/18 1753 01/20/18 0409  WBC 11.1* 8.6  HGB 9.9* 8.6*  HCT 28.5* 24.7*  PLT 117* 93*   BMET:  Recent Labs    01/19/18 0403 01/20/18 0409  NA 129* 136  K 4.6 3.7  CL 95* 99*  CO2 30 30  GLUCOSE 123* 99  BUN 11 10  CREATININE 1.16 1.12  CALCIUM 7.8* 7.9*    PT/INR:  Recent Labs    01/19/18 0613  LABPROT 14.8  INR 1.17   ABG    Component Value Date/Time   PHART 7.373 01/18/2018 0459   HCO3 25.9 01/18/2018 0459   TCO2 27 01/18/2018 0459   ACIDBASEDEF 1.0 01/18/2018 0418   O2SAT 98.0 01/18/2018 0459   CBG (last 3)  No results for input(s): GLUCAP in the last 72 hours.  Assessment/Plan: S/P Procedure(s) (LRB): left VIDEO ASSISTED THORACOSCOPY (VATS) with left lower lobe wedge resection and LOBECTOMY (Left) Plan for transfer to step-down: see transfer orders  Looks much better this AM Hgb down slightly - suspect reequilibration rather than  ongoing bleeding No air leak- CT on water seal CXR shows no change to slight improvement in hematoma In SR but with frequent PACs this am- continue amiodarone, add lopressor Supplement K IV to KVO Enoxaparin stopped due to bleeding, SCD for DVT prophlylaxis   LOS: 3 days    Melrose Nakayama 01/20/2018

## 2018-01-20 NOTE — Progress Notes (Signed)
Patient ID: Curtis Henry, male   DOB: Nov 18, 1955, 62 y.o.   MRN: [id number] EVENING ROUNDS NOTE :     [address]       [place] [address]             [phone]                 3 Days Post-Op Procedure(s) (LRB): left VIDEO ASSISTED THORACOSCOPY (VATS) with left lower lobe wedge resection and LOBECTOMY (Left)  Total Length of Stay:  LOS: 3 days  BP (!) 152/70   Pulse (!) 57   Temp 98.5 F (36.9 C) (Oral)   Resp 16   Ht 6' (1.829 m)   Wt 128 lb 12.8 oz (58.4 kg)   SpO2 96%   BMI 17.47 kg/m   .Intake/Output      05/05 0701 - 05/06 0700 05/06 0701 - 05/07 0700   P.O. 1078 240   I.V. (mL/kg) 2512.5 (43) 150.3 (2.6)   Blood 660    IV Piggyback 50    Total Intake(mL/kg) 4300.5 (73.6) 390.3 (6.7)   Urine (mL/kg/hr) 3115 (2.2) 625 (1)   Stool 0    Chest Tube 970 90   Total Output 4085 715   Net +215.5 -324.7        Urine Occurrence 1 x    Stool Occurrence 2 x      . sodium chloride 10 mL/hr at 01/20/18 0826  . amiodarone 30 mg/hr (01/20/18 1600)  . famotidine (PEPCID) IV Stopped (01/20/18 1156)  . potassium chloride       Lab Results  Component Value Date   WBC 8.6 01/20/2018   HGB 8.6 (L) 01/20/2018   HCT 24.7 (L) 01/20/2018   PLT 93 (L) 01/20/2018   GLUCOSE 99 01/20/2018   CHOL 232 (H) 12/12/2016   TRIG 97 12/12/2016   HDL 50 12/12/2016   LDLCALC 163 (H) 12/12/2016   ALT 18 01/19/2018   AST 46 (H) 01/19/2018   NA 136 01/20/2018   K 3.7 01/20/2018   CL 99 (L) 01/20/2018   CREATININE 1.12 01/20/2018   BUN 10 01/20/2018   CO2 30 01/20/2018   INR 1.17 01/19/2018   Chest tube to water seal, very small air leak  Stable    Grace Isaac MD  Beeper [phone] Office [phone] 01/20/2018 5:19 PM

## 2018-01-21 ENCOUNTER — Inpatient Hospital Stay (HOSPITAL_COMMUNITY): Payer: BLUE CROSS/BLUE SHIELD

## 2018-01-21 LAB — CBC
HCT: 23.8 % — ABNORMAL LOW (ref 39.0–52.0)
Hemoglobin: 8.1 g/dL — ABNORMAL LOW (ref 13.0–17.0)
MCH: 28.5 pg (ref 26.0–34.0)
MCHC: 34 g/dL (ref 30.0–36.0)
MCV: 83.8 fL (ref 78.0–100.0)
Platelets: 118 10*3/uL — ABNORMAL LOW (ref 150–400)
RBC: 2.84 MIL/uL — ABNORMAL LOW (ref 4.22–5.81)
RDW: 14.5 % (ref 11.5–15.5)
WBC: 8.7 10*3/uL (ref 4.0–10.5)

## 2018-01-21 LAB — BASIC METABOLIC PANEL
Anion gap: 7 (ref 5–15)
BUN: 8 mg/dL (ref 6–20)
CALCIUM: 8.2 mg/dL — AB (ref 8.9–10.3)
CO2: 31 mmol/L (ref 22–32)
CREATININE: 0.97 mg/dL (ref 0.61–1.24)
Chloride: 99 mmol/L — ABNORMAL LOW (ref 101–111)
GFR calc Af Amer: >60 mL/min (ref >60)
GLUCOSE: 96 mg/dL (ref 65–99)
Potassium: 3.6 mmol/L (ref 3.5–5.1)
Sodium: 137 mmol/L (ref 135–145)

## 2018-01-21 MED ORDER — FAMOTIDINE 20 MG PO TABS
20.0000 mg | ORAL_TABLET | Freq: Two times a day (BID) | ORAL | Status: DC
  Administered 2018-01-21 – 2018-01-28 (×14): 20 mg via ORAL
  Filled 2018-01-21 (×14): qty 1

## 2018-01-21 NOTE — Progress Notes (Signed)
4 Days Post-Op Procedure(s) (LRB): left VIDEO ASSISTED THORACOSCOPY (VATS) with left lower lobe wedge resection and LOBECTOMY (Left) Subjective: C/o food not tasting good   Objective: Vital signs in last 24 hours: Temp:  [98.3 F (36.8 C)-98.7 F (37.1 C)] 98.7 F (37.1 C) (05/07 0401) Pulse Rate:  [52-118] 52 (05/07 0700) Cardiac Rhythm: Normal sinus rhythm (05/07 0400) Resp:  [10-35] 15 (05/07 0700) BP: (104-170)/(53-106) 143/68 (05/07 0700) SpO2:  [94 %-100 %] 98 % (05/07 0700)  Hemodynamic parameters for last 24 hours:    Intake/Output from previous day: 05/06 0701 - 05/07 0700 In: 1120.8 [P.O.:720; I.V.:400.8] Out: 2270 [Urine:2000; Chest Tube:270] Intake/Output this shift: No intake/output data recorded.  General appearance: alert, cooperative and no distress Neurologic: intact Heart: regular rate and rhythm Lungs: diminished breath sounds left base Abdomen: normal findings: soft, non-tender small air leak apparent this AM.   Lab Results: Recent Labs    01/20/18 0409 01/21/18 0347  WBC 8.6 8.7  HGB 8.6* 8.1*  HCT 24.7* 23.8*  PLT 93* 118*   BMET:  Recent Labs    01/20/18 0409 01/21/18 0347  NA 136 137  K 3.7 3.6  CL 99* 99*  CO2 30 31  GLUCOSE 99 96  BUN 10 8  CREATININE 1.12 0.97  CALCIUM 7.9* 8.2*    PT/INR:  Recent Labs    01/19/18 0613  LABPROT 14.8  INR 1.17   ABG    Component Value Date/Time   PHART 7.373 01/18/2018 0459   HCO3 25.9 01/18/2018 0459   TCO2 27 01/18/2018 0459   ACIDBASEDEF 1.0 01/18/2018 0418   O2SAT 98.0 01/18/2018 0459   CBG (last 3)  No results for input(s): GLUCAP in the last 72 hours.  Assessment/Plan: S/P Procedure(s) (LRB): left VIDEO ASSISTED THORACOSCOPY (VATS) with left lower lobe wedge resection and LOBECTOMY (Left) -CV- stable  RESP- has a small air leak this AM  Drainage from CT is down, CXR looks a little better this AM  Keep both CT today  RENAL- creatinine and lytes OK  Anemia- stable  after transfusion  SCD for DVT prophylaxis   LOS: 4 days    Melrose Nakayama 01/21/2018

## 2018-01-21 NOTE — Progress Notes (Signed)
TCTS BRIEF SICU PROGRESS NOTE  4 Days Post-Op  S/P Procedure(s) (LRB): left VIDEO ASSISTED THORACOSCOPY (VATS) with left lower lobe wedge resection and LOBECTOMY (Left)   Stable day  Plan: Continue current plan  Rexene Alberts, MD 01/21/2018 6:23 PM

## 2018-01-22 ENCOUNTER — Inpatient Hospital Stay (HOSPITAL_COMMUNITY): Payer: BLUE CROSS/BLUE SHIELD

## 2018-01-22 LAB — CBC
HEMATOCRIT: 27.1 % — AB (ref 39.0–52.0)
HEMOGLOBIN: 9.2 g/dL — AB (ref 13.0–17.0)
MCH: 29.1 pg (ref 26.0–34.0)
MCHC: 33.9 g/dL (ref 30.0–36.0)
MCV: 85.8 fL (ref 78.0–100.0)
Platelets: 172 10*3/uL (ref 150–400)
RBC: 3.16 MIL/uL — ABNORMAL LOW (ref 4.22–5.81)
RDW: 14.6 % (ref 11.5–15.5)
WBC: 8.4 10*3/uL (ref 4.0–10.5)

## 2018-01-22 LAB — BASIC METABOLIC PANEL
ANION GAP: 6 (ref 5–15)
BUN: 5 mg/dL — AB (ref 6–20)
CALCIUM: 8.5 mg/dL — AB (ref 8.9–10.3)
CO2: 33 mmol/L — AB (ref 22–32)
Chloride: 98 mmol/L — ABNORMAL LOW (ref 101–111)
Creatinine, Ser: 1.02 mg/dL (ref 0.61–1.24)
GFR calc Af Amer: >60 mL/min (ref >60)
Glucose, Bld: 122 mg/dL — ABNORMAL HIGH (ref 65–99)
Potassium: 3.6 mmol/L (ref 3.5–5.1)
Sodium: 137 mmol/L (ref 135–145)

## 2018-01-22 MED ORDER — POTASSIUM CHLORIDE CRYS ER 20 MEQ PO TBCR
20.0000 meq | EXTENDED_RELEASE_TABLET | ORAL | Status: AC
  Administered 2018-01-22 (×3): 20 meq via ORAL
  Filled 2018-01-22 (×3): qty 1

## 2018-01-22 NOTE — Progress Notes (Addendum)
      [address]       [place] [address]             [phone]      5 Days Post-Op Procedure(s) (LRB): left VIDEO ASSISTED THORACOSCOPY (VATS) with left lower lobe wedge resection and LOBECTOMY (Left) Subjective: No issues overnight. His pain is still significant at times but the PCA pump helps him get some rest. Patient is anxious to get home and take care of his sick wife.   Objective: Vital signs in last 24 hours: Temp:  [97.4 F (36.3 C)-98.7 F (37.1 C)] 98.5 F (36.9 C) (05/08 0435) Pulse Rate:  [49-120] 63 (05/08 0800) Cardiac Rhythm: Normal sinus rhythm (05/08 0800) Resp:  [9-40] 16 (05/08 0800) BP: (105-168)/(57-128) 149/69 (05/08 0800) SpO2:  [97 %-100 %] 100 % (05/08 0800)     Intake/Output from previous day: 05/07 0701 - 05/08 0700 In: 1409.8 [P.O.:400; I.V.:1009.8] Out: 2435 [Urine:2275; Chest Tube:160] Intake/Output this shift: Total I/O In: 36.7 [I.V.:36.7] Out: 350 [Urine:350]  General appearance: alert, cooperative and no distress Heart: regular rate and rhythm, S1, S2 normal, no murmur, click, rub or gallop Lungs: clear to auscultation bilaterally Abdomen: soft, non-tender; bowel sounds normal; no masses,  no organomegaly Extremities: extremities normal, atraumatic, no cyanosis or edema Wound: clean and dry  Lab Results: Recent Labs    01/21/18 0347 01/22/18 0500  WBC 8.7 8.4  HGB 8.1* 9.2*  HCT 23.8* 27.1*  PLT 118* 172   BMET:  Recent Labs    01/21/18 0347 01/22/18 0500  NA 137 137  K 3.6 3.6  CL 99* 98*  CO2 31 33*  GLUCOSE 96 122*  BUN 8 5*  CREATININE 0.97 1.02  CALCIUM 8.2* 8.5*    PT/INR: No results for input(s): LABPROT, INR in the last 72 hours. ABG    Component Value Date/Time   PHART 7.373 01/18/2018 0459   HCO3 25.9 01/18/2018 0459   TCO2 27 01/18/2018 0459   ACIDBASEDEF 1.0 01/18/2018 0418   O2SAT 98.0 01/18/2018 0459   CBG (last 3)  No results for input(s): GLUCAP in the last 72  hours.  Assessment/Plan: S/P Procedure(s) (LRB): left VIDEO ASSISTED THORACOSCOPY (VATS) with left lower lobe wedge resection and LOBECTOMY (Left)  1. CV-BP high at times. He was not on any agents at home. He is on Metoprolol 25mg  BID. Remains in NSR rate in the 60s with occassional PAC.  2. Pulm-remains on room air with excellent oxygen saturation. 160cc/24 hours recorded for chest tube drainage. No airleak on water seal CXR appears stable. COPD continue Xopenex nebs and Incruse Ellipta  3. Renal-creatinine up to 1.02 today. Supplement potassium. Not on Lasix 4. H and H stable 9.2/27.1 today, platelets up to 172k 5. Endo-blood glucose has been well controlled, not a diabetic.  6. Malnutrition- on Ensure.   Plan: No visible air leak today. CXR has improved this morning.  Possible removal of one chest tube today.     LOS: 5 days    Elgie Collard 01/22/2018 Patient examined chest x-ray reviewed No air leak detectable, serosanguineous drainage is decreasing with stable hemoglobin We will remove anterior chest tube today, leave posterior Bard drain.   patient examined and medical record reviewed,agree with above note. Tharon Aquas Trigt III 01/22/2018

## 2018-01-22 NOTE — Progress Notes (Signed)
      [address]       [place] [address]             [phone]      No complaints  Still waiting on step down bed  Brindley Madarang C. Roxan Hockey, MD [facility] [phone]

## 2018-01-23 ENCOUNTER — Inpatient Hospital Stay (HOSPITAL_COMMUNITY): Payer: BLUE CROSS/BLUE SHIELD

## 2018-01-23 LAB — CBC
HCT: 28.4 % — ABNORMAL LOW (ref 39.0–52.0)
Hemoglobin: 9.4 g/dL — ABNORMAL LOW (ref 13.0–17.0)
MCH: 28.9 pg (ref 26.0–34.0)
MCHC: 33.1 g/dL (ref 30.0–36.0)
MCV: 87.4 fL (ref 78.0–100.0)
Platelets: 209 10*3/uL (ref 150–400)
RBC: 3.25 MIL/uL — ABNORMAL LOW (ref 4.22–5.81)
RDW: 15.1 % (ref 11.5–15.5)
WBC: 9.6 10*3/uL (ref 4.0–10.5)

## 2018-01-23 MED ORDER — AMIODARONE HCL 200 MG PO TABS
200.0000 mg | ORAL_TABLET | Freq: Two times a day (BID) | ORAL | Status: DC
  Administered 2018-01-23 – 2018-01-24 (×4): 200 mg via ORAL
  Filled 2018-01-23 (×4): qty 1

## 2018-01-23 MED ORDER — METOPROLOL TARTRATE 12.5 MG HALF TABLET
12.5000 mg | ORAL_TABLET | Freq: Two times a day (BID) | ORAL | Status: DC
  Administered 2018-01-23 – 2018-01-28 (×9): 12.5 mg via ORAL
  Filled 2018-01-23 (×10): qty 1

## 2018-01-23 NOTE — Progress Notes (Addendum)
      [address]       [place] [address]             [phone]      6 Days Post-Op Procedure(s) (LRB): left VIDEO ASSISTED THORACOSCOPY (VATS) with left lower lobe wedge resection and LOBECTOMY (Left) Subjective: Feels good today.   Objective: Vital signs in last 24 hours: Temp:  [97.8 F (36.6 C)-98.7 F (37.1 C)] 98.7 F (37.1 C) (05/09 1201) Pulse Rate:  [50-73] 60 (05/09 1200) Cardiac Rhythm: Normal sinus rhythm (05/09 0800) Resp:  [10-22] 21 (05/09 1200) BP: (100-152)/(61-89) 109/65 (05/09 1200) SpO2:  [97 %-100 %] 100 % (05/09 1200)     Intake/Output from previous day: 05/08 0701 - 05/09 0700 In: 670.8 [I.V.:670.8] Out: 1565 [Urine:1425; Chest Tube:140] Intake/Output this shift: Total I/O In: 26.7 [I.V.:26.7] Out: 450 [Urine:450]  General appearance: alert, cooperative and no distress Heart: regular rate and rhythm, S1, S2 normal, no murmur, click, rub or gallop Lungs: clear to auscultation bilaterally Abdomen: soft, non-tender; bowel sounds normal; no masses,  no organomegaly Extremities: extremities normal, atraumatic, no cyanosis or edema Wound: clean and dry  Lab Results: Recent Labs    01/22/18 0500 01/23/18 0429  WBC 8.4 9.6  HGB 9.2* 9.4*  HCT 27.1* 28.4*  PLT 172 209   BMET:  Recent Labs    01/21/18 0347 01/22/18 0500  NA 137 137  K 3.6 3.6  CL 99* 98*  CO2 31 33*  GLUCOSE 96 122*  BUN 8 5*  CREATININE 0.97 1.02  CALCIUM 8.2* 8.5*    PT/INR: No results for input(s): LABPROT, INR in the last 72 hours. ABG    Component Value Date/Time   PHART 7.373 01/18/2018 0459   HCO3 25.9 01/18/2018 0459   TCO2 27 01/18/2018 0459   ACIDBASEDEF 1.0 01/18/2018 0418   O2SAT 98.0 01/18/2018 0459   CBG (last 3)  No results for input(s): GLUCAP in the last 72 hours.  Assessment/Plan: S/P Procedure(s) (LRB): left VIDEO ASSISTED THORACOSCOPY (VATS) with left lower lobe wedge resection and LOBECTOMY (Left)   1. CV-BP  low this afternoon. He was not on any agents at home. He is on Metoprolol 25mg  BID. Remains in NSR rate in the 50s. Decrease Metoprolol to 12.5mg  BID 2. Pulm-remains on room air with excellent oxygen saturation. 140cc/24 hours recorded for chest tube drainage. +fluid wave. No detectable air leak on water seal.  COPD continue Xopenex nebs and Incruse Ellipta  3. Renal-creatinine 1.02. Supplement potassium. Does not need lasix.  4. H and H stable 9.4/28.4 today, platelets up to 209k 5. Endo-blood glucose has been well controlled, not a diabetic.  6. Malnutrition- on Ensure.  7. Discussed smoking cessation and hydration since he sometimes becomes dizzy when he stands quickly. I'm thinking this is probably orthostatic hypotension.   Plan: Transfer to 4E when bed available. Keep posterior chest tube for now. Ambulate in the halls as tolerated. Decrease BB for bradycardia.    LOS: 6 days    Curtis Henry 01/23/2018 Patient examined and chest x-ray images personally reviewed  air leak is present from chest tube with coughing Continue waterseal Pleural-based hematoma is improved, hemoglobin slowly improved Transfer to telemetry bed Stop IV amiodarone and DC central line  Pathology shows 1.5 cm squamous cell carcinoma margins clear- pathologic stage Ia Curtis Trigt Md

## 2018-01-23 NOTE — Progress Notes (Signed)
Fentanyl PCa discontinued. 10cc left in syringe. Checked by Rosario Jacks and wasted down the sink.

## 2018-01-24 ENCOUNTER — Inpatient Hospital Stay (HOSPITAL_COMMUNITY): Payer: BLUE CROSS/BLUE SHIELD

## 2018-01-24 MED ORDER — PRO-STAT SUGAR FREE PO LIQD
30.0000 mL | Freq: Two times a day (BID) | ORAL | Status: DC
  Administered 2018-01-24 – 2018-01-27 (×5): 30 mL via ORAL
  Filled 2018-01-24 (×9): qty 30

## 2018-01-24 MED ORDER — BOOST / RESOURCE BREEZE PO LIQD CUSTOM: 1.0000 | Freq: Three times a day (TID) | ORAL | Status: DC

## 2018-01-24 NOTE — Progress Notes (Addendum)
Nutrition Follow Up  DOCUMENTATION CODES:   Underweight  INTERVENTION:    D/C Ensure Enlive  Prostat liquid protein po 30 ml BID with meals, each supplement provides 100 kcal, 15 grams protein  NUTRITION DIAGNOSIS:   Increased nutrient needs related to cancer and cancer related treatments, post-op healing, chronic illness as evidenced by estimated needs, ongoing  GOAL:   Patient will meet greater than or equal to 90% of their needs, progressing  MONITOR:   PO intake, Supplement acceptance, Weight trends, Labs, I & O's, Skin  ASSESSMENT:   62 y.o. M admitted on 01/17/18 for lobectomy of lung now post-op. PMH of lung cancer, hyperlipidemia, GERD. Quit smoking 1 month PTA (smoked for 42 years) and current marijuana.   RD met with pt at bedside. Sister and wife present. States he couldn't eat his cheese grits at lunch bc he is lactose intolerant. PO intake poor at 10-25% per flowsheet records.  Pt was asking his family to bring him in I-Hop pancakes upon visit. Has Ensure Enlive ordered but he can't tolerate it.  Amenable to Boost Breeze. Labs and medications reviewed.  Deferred completing repeat Nutrition Focused Physical Exam. Suspect some level of malnutrition.   ADDENDUM 1457: Received page per RN. Pt does not want Boost Breeze due to whey protein isolate ingredient in Boost Breeze. Will order Prostat liquid protein. Can add to beverage of choice.  Diet Order:   Diet Order           Diet regular Room service appropriate? Yes with Assist; Fluid consistency: Thin  Diet effective now         EDUCATION NEEDS:   Education needs have been addressed  Skin:  Skin Assessment: Reviewed RN Assessment  Last BM:  5/8   Intake/Output Summary (Last 24 hours) at 01/24/2018 1415 Last data filed at 01/24/2018 1328 Gross per 24 hour  Intake 1354.51 ml  Output 1680 ml  Net -325.49 ml   Height:   Ht Readings from Last 1 Encounters:  01/17/18 6' (1.829 m)   Weight:   Wt  Readings from Last 1 Encounters:  01/19/18 128 lb 12.8 oz (58.4 kg)   Ideal Body Weight:  80.9 kg  BMI:  Body mass index is 17.47 kg/m.  Estimated Nutritional Needs:   Kcal:  1800-2000  Protein:  85-100 gm  Fluid:  1.8-2.0 L  Katie Lamberton, RD, LDN Pager #: [phone] After-Hours Pager #: [phone]   

## 2018-01-24 NOTE — Progress Notes (Addendum)
Arthur Holms, RD paged at this time. Pt unable to tolerate milk and ask for a protein supplement without it.   Emelda Fear, RN

## 2018-01-24 NOTE — Progress Notes (Signed)
Chest tube chamber was almost full. Replaced with a new chest tube chamber. Small air leak noted.  Will continue to monitor.  Lupita Dawn, RN

## 2018-01-24 NOTE — Care Management Note (Signed)
Case Management Note Marvetta Gibbons RN, BSN Unit 4E-Case Manager [phone]  Patient Details  Name: Curtis Henry MRN: [id number] Date of Birth: March 29, 1956  Subjective/Objective:   Pt admitted s/p lobectomy/ VATS with wedge resection 5/3                Action/Plan: PTA Pt lived home with spouse- anticipate return home, CM to follow for transition of care needs  Expected Discharge Date:                  Expected Discharge Plan:  Home/Self Care  In-House Referral:     Discharge planning Services  CM Consult  Post Acute Care Choice:    Choice offered to:     DME Arranged:    DME Agency:     HH Arranged:    [place] Agency:     Status of Service:  In process, will continue to follow  If discussed at Long Length of Stay Meetings, dates discussed:   Discharge Disposition:    Additional Comments:  Dawayne Patricia, RN 01/24/2018, 4:18 PM

## 2018-01-24 NOTE — Progress Notes (Addendum)
      [address]       [place] [address]             [phone]      7 Days Post-Op Procedure(s) (LRB): left VIDEO ASSISTED THORACOSCOPY (VATS) with left lower lobe wedge resection and LOBECTOMY (Left) Subjective: Feels okay this morning. He is having some pain due to the chest xray positioning this morning.   Objective: Vital signs in last 24 hours: Temp:  [97.8 F (36.6 C)-98.8 F (37.1 C)] 98.2 F (36.8 C) (05/10 0316) Pulse Rate:  [51-65] 62 (05/09 1603) Cardiac Rhythm: Normal sinus rhythm (05/09 1900) Resp:  [13-22] 13 (05/09 1603) BP: (109-175)/(56-86) 112/69 (05/10 0316) SpO2:  [97 %-100 %] 100 % (05/09 1603)     Intake/Output from previous day: 05/09 0701 - 05/10 0700 In: 1021.2 [P.O.:762; I.V.:259.2] Out: 630 [Urine:450; Chest Tube:180] Intake/Output this shift: No intake/output data recorded.  General appearance: alert, cooperative and no distress Heart: regular rate and rhythm, S1, S2 normal, no murmur, click, rub or gallop Lungs: clear to auscultation bilaterally Abdomen: soft, non-tender; bowel sounds normal; no masses,  no organomegaly Extremities: extremities normal, atraumatic, no cyanosis or edema Wound: clean and dry  Lab Results: Recent Labs    01/22/18 0500 01/23/18 0429  WBC 8.4 9.6  HGB 9.2* 9.4*  HCT 27.1* 28.4*  PLT 172 209   BMET:  Recent Labs    01/22/18 0500  NA 137  K 3.6  CL 98*  CO2 33*  GLUCOSE 122*  BUN 5*  CREATININE 1.02  CALCIUM 8.5*    PT/INR: No results for input(s): LABPROT, INR in the last 72 hours. ABG    Component Value Date/Time   PHART 7.373 01/18/2018 0459   HCO3 25.9 01/18/2018 0459   TCO2 27 01/18/2018 0459   ACIDBASEDEF 1.0 01/18/2018 0418   O2SAT 98.0 01/18/2018 0459   CBG (last 3)  No results for input(s): GLUCAP in the last 72 hours.  Assessment/Plan: S/P Procedure(s) (LRB): left VIDEO ASSISTED THORACOSCOPY (VATS) with left lower lobe wedge resection and LOBECTOMY  (Left)   1. CV-NSR in the 60s, BP well controlled on less Metoprolol today.  2. CXR is stable, there is a 4+ air leak with cough on water seal. Maybe could consider a mini express.  3. Renal-creatinine 1.02. Supplement potassium. Does not need lasix.  4. H and H stable 9.4/28.4 yesterday, platelets up to 209k 5. Endo-blood glucose has been well controlled, not a diabetic.  6. Malnutrition- on Ensure.  Plan: continue chest tube due to air leak. Ambulate in the halls. Work on pain control.     LOS: 7 days    Curtis Henry 01/24/2018  patient examined and medical record reviewed,agree with above note. Curtis Henry 01/24/2018

## 2018-01-25 ENCOUNTER — Inpatient Hospital Stay (HOSPITAL_COMMUNITY): Payer: BLUE CROSS/BLUE SHIELD

## 2018-01-25 LAB — CBC
HCT: 30.6 % — ABNORMAL LOW (ref 39.0–52.0)
Hemoglobin: 10.2 g/dL — ABNORMAL LOW (ref 13.0–17.0)
MCH: 29.1 pg (ref 26.0–34.0)
MCHC: 33.3 g/dL (ref 30.0–36.0)
MCV: 87.4 fL (ref 78.0–100.0)
Platelets: 283 10*3/uL (ref 150–400)
RBC: 3.5 MIL/uL — ABNORMAL LOW (ref 4.22–5.81)
RDW: 15.1 % (ref 11.5–15.5)
WBC: 10.6 10*3/uL — ABNORMAL HIGH (ref 4.0–10.5)

## 2018-01-25 MED ORDER — AMIODARONE HCL 200 MG PO TABS
200.0000 mg | ORAL_TABLET | Freq: Every day | ORAL | Status: DC
  Administered 2018-01-25 – 2018-01-28 (×4): 200 mg via ORAL
  Filled 2018-01-25 (×4): qty 1

## 2018-01-25 MED ORDER — KETOROLAC TROMETHAMINE 30 MG/ML IJ SOLN: 15.0000 mg | Freq: Four times a day (QID) | INTRAMUSCULAR | Status: DC

## 2018-01-25 MED ORDER — KETOROLAC TROMETHAMINE 30 MG/ML IJ SOLN: 30.0000 mg | Freq: Four times a day (QID) | INTRAMUSCULAR | Status: DC

## 2018-01-25 MED ORDER — KETOROLAC TROMETHAMINE 30 MG/ML IJ SOLN
15.0000 mg | Freq: Four times a day (QID) | INTRAMUSCULAR | Status: AC
  Administered 2018-01-25 (×3): 15 mg via INTRAVENOUS
  Filled 2018-01-25 (×3): qty 1

## 2018-01-25 NOTE — Progress Notes (Addendum)
      [address]       [place] [address]             [phone]       8 Days Post-Op Procedure(s) (LRB): left VIDEO ASSISTED THORACOSCOPY (VATS) with left lower lobe wedge resection and LOBECTOMY (Left)  Subjective: Patient with a lot of pain at left chest tube site last night. He did not sleep much because of pain.  Objective: Vital signs in last 24 hours: Temp:  [98 F (36.7 C)-98.5 F (36.9 C)] 98 F (36.7 C) (05/10 2008) Pulse Rate:  [67] 67 (05/10 2008) Cardiac Rhythm: Normal sinus rhythm (05/10 1900) Resp:  [16-17] 16 (05/10 2008) BP: (128-154)/(66-82) 154/82 (05/10 2008) SpO2:  [98 %-100 %] 100 % (05/10 2008)     Intake/Output from previous day: 05/10 0701 - 05/11 0700 In: 360 [P.O.:360] Out: 2470 [Urine:2300; Chest Tube:170]   Physical Exam:  Cardiovascular: RRR Pulmonary: Clear to auscultation bilaterally Abdomen: Soft, non tender, bowel sounds present. Extremities: No LE edema Wound: Clean and dry.  No erythema or signs of infection. Chest Tube: to water seal, small air leak with cough  Lab Results: CBC: Recent Labs    01/23/18 0429 01/25/18 0415  WBC 9.6 10.6*  HGB 9.4* 10.2*  HCT 28.4* 30.6*  PLT 209 283   BMET: No results for input(s): NA, K, CL, CO2, GLUCOSE, BUN, CREATININE, CALCIUM in the last 72 hours.  PT/INR: No results for input(s): LABPROT, INR in the last 72 hours. ABG:  INR: Will add last result for INR, ABG once components are confirmed Will add last 4 CBG results once components are confirmed  Assessment/Plan:  1. CV - SB/SR. On Amiodarone 200 mg bid, Lopressor 12.5 mg bid. Will decrease Amiodarone to 200 mg daily as at times has bradycardia. 2.  Pulmonary - On room air. Chest tube with 170 of output last 24 hours. Chest tube is to water seal and there is an air leak +1-2 with cough. CXR appears stable this am. Chest tube to remain for now. Encourage incentive spirometer. 3. Will give 2 doses of Toradol to  help with pain from left chest tube  Donielle M ZimmermanPA-C 01/25/2018,7:23 AM    patient examined and medical record reviewed,agree with above note. Tharon Aquas Trigt III 01/25/2018

## 2018-01-26 ENCOUNTER — Inpatient Hospital Stay (HOSPITAL_COMMUNITY): Payer: BLUE CROSS/BLUE SHIELD

## 2018-01-26 MED ORDER — KETOROLAC TROMETHAMINE 30 MG/ML IJ SOLN
15.0000 mg | Freq: Four times a day (QID) | INTRAMUSCULAR | Status: AC
Start: 2018-01-26 — End: 2018-01-26
  Administered 2018-01-26 (×2): 15 mg via INTRAVENOUS
  Filled 2018-01-26 (×2): qty 1

## 2018-01-26 MED ORDER — KETOROLAC TROMETHAMINE 30 MG/ML IJ SOLN
15.0000 mg | Freq: Four times a day (QID) | INTRAMUSCULAR | Status: AC | PRN
  Administered 2018-01-26 – 2018-01-27 (×3): 15 mg via INTRAVENOUS
  Filled 2018-01-26 (×3): qty 1

## 2018-01-26 NOTE — Progress Notes (Signed)
Chest tube dressing changed

## 2018-01-26 NOTE — Progress Notes (Addendum)
      [address]       [place] [address]             [phone]       9 Days Post-Op Procedure(s) (LRB): left VIDEO ASSISTED THORACOSCOPY (VATS) with left lower lobe wedge resection and LOBECTOMY (Left)  Subjective: Patient states Toradol really helped him. He is asking for another dose or two this am.  Objective: Vital signs in last 24 hours: Temp:  [97.9 F (36.6 C)-98.1 F (36.7 C)] 98 F (36.7 C) (05/12 0311) Pulse Rate:  [57-66] 65 (05/11 2049) Cardiac Rhythm: Sinus bradycardia (05/11 1900) Resp:  [13-21] 13 (05/12 0311) BP: (134-139)/(78-82) 134/82 (05/12 0311) SpO2:  [96 %-100 %] 96 % (05/12 0311)     Intake/Output from previous day: 05/11 0701 - 05/12 0700 In: 1080 [P.O.:1080] Out: 2270 [Urine:2000; Chest Tube:270]   Physical Exam:  Cardiovascular: RRR Pulmonary: Clear to auscultation bilaterally Abdomen: Soft, non tender, bowel sounds present. Extremities: No LE edema Wound: Clean and dry.  No erythema or signs of infection. Chest Tube: to water seal. There is tidling with cough but no true air leak this am.  Lab Results: CBC: Recent Labs    01/25/18 0415  WBC 10.6*  HGB 10.2*  HCT 30.6*  PLT 283   BMET: No results for input(s): NA, K, CL, CO2, GLUCOSE, BUN, CREATININE, CALCIUM in the last 72 hours.  PT/INR: No results for input(s): LABPROT, INR in the last 72 hours. ABG:  INR: Will add last result for INR, ABG once components are confirmed Will add last 4 CBG results once components are confirmed  Assessment/Plan:  1. CV - SB/SR. On Amiodarone 200 mg bid, Lopressor 12.5 mg bid. Will decrease Amiodarone to 200 mg daily as at times has bradycardia. 2.  Pulmonary - On room air. Chest tube with 300 of output last 24 hours (from my [facility]). Chest tube is to water seal and there is tidling with cough but no true air leak. CXR appears stable this am. Hope to remove chest tube soon. Encourage incentive spirometer. 3. Will give 2  more doses of Toradol  Donielle M ZimmermanPA-C 01/26/2018,8:01 AM   Air leak with cough Moderate protein deficient malnutrition Connect to [employer] tomorrow patient examined and medical record reviewed,agree with above note. Tharon Aquas Trigt III 01/26/2018

## 2018-01-27 ENCOUNTER — Inpatient Hospital Stay (HOSPITAL_COMMUNITY): Payer: BLUE CROSS/BLUE SHIELD

## 2018-01-27 MED ORDER — METOPROLOL TARTRATE 25 MG PO TABS: 12.5000 mg | ORAL_TABLET | Freq: Two times a day (BID) | ORAL | 1 refills | Status: DC

## 2018-01-27 MED ORDER — OXYCODONE HCL 5 MG PO TABS: 5.0000 mg | ORAL_TABLET | Freq: Four times a day (QID) | ORAL | 0 refills | Status: DC | PRN

## 2018-01-27 MED ORDER — AMIODARONE HCL 200 MG PO TABS: 200.0000 mg | ORAL_TABLET | Freq: Every day | ORAL | 1 refills | Status: DC

## 2018-01-27 MED ORDER — PRO-STAT SUGAR FREE PO LIQD: 30.0000 mL | Freq: Two times a day (BID) | ORAL | 0 refills | Status: DC

## 2018-01-27 NOTE — Progress Notes (Addendum)
      [address]       ,[place] [address]             [phone]      10 Days Post-Op Procedure(s) (LRB): left VIDEO ASSISTED THORACOSCOPY (VATS) with left lower lobe wedge resection and LOBECTOMY (Left) Subjective: Feels okay this morning.   Objective: Vital signs in last 24 hours: Temp:  [98 F (36.7 C)-98.4 F (36.9 C)] 98.2 F (36.8 C) (05/12 2019) Pulse Rate:  [60-75] 69 (05/12 2019) Cardiac Rhythm: Normal sinus rhythm (05/12 1900) Resp:  [17-20] 17 (05/12 2019) BP: (138-152)/(79-85) 148/84 (05/12 2019) SpO2:  [99 %-100 %] 100 % (05/12 2019)     Intake/Output from previous day: 05/12 0701 - 05/13 0700 In: 240 [P.O.:240] Out: 50 [Chest Tube:50] Intake/Output this shift: No intake/output data recorded.  General appearance: alert, cooperative and no distress Heart: regular rate and rhythm, S1, S2 normal, no murmur, click, rub or gallop Lungs: clear to auscultation bilaterally Abdomen: soft, non-tender; bowel sounds normal; no masses,  no organomegaly Extremities: extremities normal, atraumatic, no cyanosis or edema Wound: clean and dry  Lab Results: Recent Labs    01/25/18 0415  WBC 10.6*  HGB 10.2*  HCT 30.6*  PLT 283   BMET: No results for input(s): NA, K, CL, CO2, GLUCOSE, BUN, CREATININE, CALCIUM in the last 72 hours.  PT/INR: No results for input(s): LABPROT, INR in the last 72 hours. ABG    Component Value Date/Time   PHART 7.373 01/18/2018 0459   HCO3 25.9 01/18/2018 0459   TCO2 27 01/18/2018 0459   ACIDBASEDEF 1.0 01/18/2018 0418   O2SAT 98.0 01/18/2018 0459   CBG (last 3)  No results for input(s): GLUCAP in the last 72 hours.  Assessment/Plan: S/P Procedure(s) (LRB): left VIDEO ASSISTED THORACOSCOPY (VATS) with left lower lobe wedge resection and LOBECTOMY (Left)  1. CV-NSR rate in the 60s. BP well controlled. Amio 200mg  oral daily. Continue Lopressor 12.5mg  BID.  2. Pulm-Stable 10-15% left apical pneumo. Stable left  sided pleural thickening or located pleural fluid. Underlying emphysema. Chest tube is to water seal.  3. Renal-creatinine 1.02, potassium replaced.  4. H and H 10.2/30.6, expected acute blood loss anemia 5. Toradol for pain.  6. Malnutrition- on Ensure  Plan: discharge patient today with mini express and bring back to the office on Wed.    LOS: 10 days    Elgie Collard 01/27/2018

## 2018-01-28 ENCOUNTER — Inpatient Hospital Stay (HOSPITAL_COMMUNITY): Payer: BLUE CROSS/BLUE SHIELD

## 2018-01-28 DIAGNOSIS — E440 Moderate protein-calorie malnutrition: Secondary | ICD-10-CM | POA: Diagnosis not present

## 2018-01-28 DIAGNOSIS — D62 Acute posthemorrhagic anemia: Secondary | ICD-10-CM | POA: Diagnosis not present

## 2018-01-28 DIAGNOSIS — E871 Hypo-osmolality and hyponatremia: Secondary | ICD-10-CM | POA: Diagnosis not present

## 2018-01-28 DIAGNOSIS — I10 Essential (primary) hypertension: Secondary | ICD-10-CM | POA: Diagnosis not present

## 2018-01-28 DIAGNOSIS — Z681 Body mass index (BMI) 19 or less, adult: Secondary | ICD-10-CM | POA: Diagnosis not present

## 2018-01-28 DIAGNOSIS — I483 Typical atrial flutter: Secondary | ICD-10-CM | POA: Diagnosis not present

## 2018-01-28 DIAGNOSIS — Z8249 Family history of ischemic heart disease and other diseases of the circulatory system: Secondary | ICD-10-CM | POA: Diagnosis not present

## 2018-01-28 DIAGNOSIS — C3432 Malignant neoplasm of lower lobe, left bronchus or lung: Secondary | ICD-10-CM | POA: Diagnosis not present

## 2018-01-28 DIAGNOSIS — J9382 Other air leak: Secondary | ICD-10-CM | POA: Diagnosis not present

## 2018-01-28 DIAGNOSIS — J439 Emphysema, unspecified: Secondary | ICD-10-CM | POA: Diagnosis not present

## 2018-01-28 DIAGNOSIS — Z800 Family history of malignant neoplasm of digestive organs: Secondary | ICD-10-CM | POA: Diagnosis not present

## 2018-01-28 DIAGNOSIS — Z87891 Personal history of nicotine dependence: Secondary | ICD-10-CM | POA: Diagnosis not present

## 2018-01-28 DIAGNOSIS — J939 Pneumothorax, unspecified: Secondary | ICD-10-CM | POA: Diagnosis not present

## 2018-01-28 DIAGNOSIS — E785 Hyperlipidemia, unspecified: Secondary | ICD-10-CM | POA: Diagnosis not present

## 2018-01-28 DIAGNOSIS — R42 Dizziness and giddiness: Secondary | ICD-10-CM | POA: Diagnosis not present

## 2018-01-28 DIAGNOSIS — K219 Gastro-esophageal reflux disease without esophagitis: Secondary | ICD-10-CM | POA: Diagnosis not present

## 2018-01-28 DIAGNOSIS — Z4682 Encounter for fitting and adjustment of non-vascular catheter: Secondary | ICD-10-CM | POA: Diagnosis not present

## 2018-01-28 DIAGNOSIS — R001 Bradycardia, unspecified: Secondary | ICD-10-CM | POA: Diagnosis not present

## 2018-01-28 DIAGNOSIS — G2581 Restless legs syndrome: Secondary | ICD-10-CM | POA: Diagnosis not present

## 2018-01-28 DIAGNOSIS — J942 Hemothorax: Secondary | ICD-10-CM | POA: Diagnosis not present

## 2018-01-28 NOTE — Care Management Note (Addendum)
Case Management Note Marvetta Gibbons RN, BSN Unit 4E-Case Manager [phone]  Patient Details  Name: ARDIAN HABERLAND MRN: [id number] Date of Birth: 1955-12-09  Subjective/Objective:   Pt admitted s/p lobectomy/ VATS with wedge resection 5/3                Action/Plan: PTA Pt lived home with spouse- anticipate return home, CM to follow for transition of care needs  Expected Discharge Date:  01/28/18               Expected Discharge Plan:  Home/Self Care  In-House Referral:     Discharge planning Services  CM Consult  Post Acute Care Choice:  Durable Medical Equipment Choice offered to:  Patient  DME Arranged:  [place] Rile rolling DME Agency:  [place] Arranged:  NA [place] Agency:  NA  Status of Service:  Completed, signed off  If discussed at [place] of Stay Meetings, dates discussed:  5/14  Discharge Disposition: home/self care    Additional Comments:  01/28/18- 1045- Deep [place] RN, CM- pt for d/c home today - order placed for DME- RW- call placed to [place] with [facility] for DME need- RW to be delivered to room prior to discharge.  1230- per [facility]- pt declined on delivery  Dawayne Patricia, RN 01/28/2018, 10:52 AM

## 2018-01-28 NOTE — Progress Notes (Signed)
Discharge instructions reviewed with patient and family. Prescriptions given and no questions or concerns. Patient left unit via wheelchair with hospital volunteer and family in stable condition and agrees to all follow up appointments and instructions.

## 2018-01-28 NOTE — Progress Notes (Signed)
Left chest tube pulled per policy. Patient tolerated well and on bedrest for one hour.

## 2018-01-28 NOTE — Progress Notes (Signed)
      [address]       , [id number]             [phone]      11 Days Post-Op Procedure(s) (LRB): left VIDEO ASSISTED THORACOSCOPY (VATS) with left lower lobe wedge resection and LOBECTOMY (Left) Subjective: He states that he would rather not go home with the mini express. He would prefer getting his chest tube out today so he can play with his grandbaby.   Objective: Vital signs in last 24 hours: Temp:  [98 F (36.7 C)-98.2 F (36.8 C)] 98 F (36.7 C) (05/14 0415) Pulse Rate:  [66-80] 80 (05/14 0415) Cardiac Rhythm: Sinus bradycardia (05/13 1900) Resp:  [18-26] 18 (05/14 0415) BP: (120-130)/(76-83) 120/83 (05/14 0415) SpO2:  [92 %-100 %] 92 % (05/14 0415)      Intake/Output from previous day: 05/13 0701 - 05/14 0700 In: 245 [P.O.:240; I.V.:5] Out: 80 [Chest Tube:80] Intake/Output this shift: No intake/output data recorded.  General appearance: alert, cooperative and no distress Heart: regular rate and rhythm, S1, S2 normal, no murmur, click, rub or gallop Lungs: clear to auscultation bilaterally Abdomen: soft, non-tender; bowel sounds normal; no masses,  no organomegaly Extremities: extremities normal, atraumatic, no cyanosis or edema Wound: clean and dry  Lab Results: No results for input(s): WBC, HGB, HCT, PLT in the last 72 hours. BMET: No results for input(s): NA, K, CL, CO2, GLUCOSE, BUN, CREATININE, CALCIUM in the last 72 hours.  PT/INR: No results for input(s): LABPROT, INR in the last 72 hours. ABG    Component Value Date/Time   PHART 7.373 01/18/2018 0459   HCO3 25.9 01/18/2018 0459   TCO2 27 01/18/2018 0459   ACIDBASEDEF 1.0 01/18/2018 0418   O2SAT 98.0 01/18/2018 0459   CBG (last 3)  No results for input(s): GLUCAP in the last 72 hours.  Assessment/Plan: S/P Procedure(s) (LRB): left VIDEO ASSISTED THORACOSCOPY (VATS) with left lower lobe wedge resection and LOBECTOMY (Left)  1. CV-NSR rate in the 60s. BP well  controlled. Amio 200mg  oral daily. Continue Lopressor 12.5mg  BID.  2. Pulm-Stable CXR yesterday shows 10-15% left apical pneumo. Stable left sided pleural thickening or located pleural fluid. Underlying emphysema. Chest tube is to water seal. Will order one this morning.  3. Renal-creatinine 1.02, potassium replaced.  4. H and H 10.2/30.6, expected acute blood loss anemia 5. Toradol for pain.  6. Malnutrition- on Ensure  Plan: Intermittent small 1+ air leak with cough. Mini express is in the room but patient would rather not switch to it. Stat chest xray today. No symptoms. He has been using a walker for walking in the hall so I will order one for home.     LOS: 11 days    Elgie Collard 01/28/2018

## 2018-01-29 ENCOUNTER — Other Ambulatory Visit: Payer: Self-pay | Admitting: *Deleted

## 2018-01-29 ENCOUNTER — Telehealth: Payer: Self-pay

## 2018-01-29 ENCOUNTER — Encounter: Payer: Self-pay | Admitting: Cardiology

## 2018-01-29 ENCOUNTER — Other Ambulatory Visit: Payer: Self-pay

## 2018-01-29 DIAGNOSIS — I4891 Unspecified atrial fibrillation: Secondary | ICD-10-CM

## 2018-01-29 MED ORDER — AMIODARONE HCL 200 MG PO TABS: 200.0000 mg | ORAL_TABLET | Freq: Every day | ORAL | 1 refills | Status: DC

## 2018-01-29 MED ORDER — METOPROLOL TARTRATE 25 MG PO TABS: 12.5000 mg | ORAL_TABLET | Freq: Two times a day (BID) | ORAL | 1 refills | Status: DC

## 2018-01-29 NOTE — Telephone Encounter (Signed)
Patient's wife called and stated that he did not receive a prescription for metoprolol after discharge from the hospital.  She also stated that this is a new prescription.  Prescription send to [facility] per patient's request.

## 2018-01-30 ENCOUNTER — Ambulatory Visit (INDEPENDENT_AMBULATORY_CARE_PROVIDER_SITE_OTHER): Payer: BLUE CROSS/BLUE SHIELD | Admitting: Internal Medicine

## 2018-01-30 ENCOUNTER — Other Ambulatory Visit: Payer: Self-pay

## 2018-01-30 ENCOUNTER — Encounter: Payer: Self-pay | Admitting: Internal Medicine

## 2018-01-30 VITALS — BP 122/60 | HR 68 | Temp 98.6°F | Ht 72.0 in | Wt 119.0 lb

## 2018-01-30 DIAGNOSIS — Z902 Acquired absence of lung [part of]: Secondary | ICD-10-CM

## 2018-01-30 DIAGNOSIS — Z720 Tobacco use: Secondary | ICD-10-CM | POA: Diagnosis not present

## 2018-01-30 DIAGNOSIS — D649 Anemia, unspecified: Secondary | ICD-10-CM | POA: Diagnosis not present

## 2018-01-30 DIAGNOSIS — I480 Paroxysmal atrial fibrillation: Secondary | ICD-10-CM | POA: Diagnosis not present

## 2018-01-30 DIAGNOSIS — Z09 Encounter for follow-up examination after completed treatment for conditions other than malignant neoplasm: Secondary | ICD-10-CM | POA: Diagnosis not present

## 2018-01-30 DIAGNOSIS — E871 Hypo-osmolality and hyponatremia: Secondary | ICD-10-CM | POA: Diagnosis not present

## 2018-01-30 MED ORDER — CAPSAICIN 0.025 % EX PADS: 1.0000 | MEDICATED_PAD | Freq: Two times a day (BID) | CUTANEOUS | 2 refills | Status: DC | PRN

## 2018-01-30 MED ORDER — NICOTINE 14 MG/24HR TD PT24: 14.0000 mg | MEDICATED_PATCH | Freq: Every day | TRANSDERMAL | 0 refills | Status: DC

## 2018-01-30 NOTE — Progress Notes (Signed)
Cardiology Office Note   Date:  01/31/2018   ID:  [name], DOB 1956/08/19, MRN [id number]  PCP:  Rogue Bussing, MD  Cardiologist:   Minus Breeding, MD   Chief Complaint  Patient presents with  . Atrial Flutter      History of Present Illness: Curtis Henry is a 62 y.o. male who presents for follow up of atrial fib.  He was in the hospital for thoracotomy, left lower lobe lobectomy for squamous cell CA.  He developed atrial flutter.  He was treated with IV amiodarone and converted to NSR.  Since discharge he has done well.  The patient denies any new symptoms such as chest discomfort, neck or arm discomfort. There has been no new shortness of breath, PND or orthopnea. There have been no reported palpitations, presyncope or syncope.   He is still having some incisional pain that is improving.     Past Medical History:  Diagnosis Date  . Bursitis of left shoulder   . GERD (gastroesophageal reflux disease)   . Hyperlipidemia   . Restless leg syndrome   . Squamous cell lung cancer [facility])     Past Surgical History:  Procedure Laterality Date  . CIRCUMCISION  12/13/2003  . COLONOSCOPY    . VIDEO ASSISTED THORACOSCOPY (VATS)/ LOBECTOMY Left 01/17/2018   Procedure: left VIDEO ASSISTED THORACOSCOPY (VATS) with left lower lobe wedge resection and LOBECTOMY;  Surgeon: Ivin Poot, MD;  Location: [facility] OR;  Service: Thoracic;  Laterality: Left;     Current Outpatient Medications  Medication Sig Dispense Refill  . Amino Acids-Protein Hydrolys (FEEDING SUPPLEMENT, PRO-STAT SUGAR FREE 64,) LIQD Take 30 mLs by mouth 2 (two) times daily. 900 mL 0  . amiodarone (PACERONE) 200 MG tablet Take 1 tablet (200 mg total) by mouth daily. 30 tablet 1  . atorvastatin (LIPITOR) 40 MG tablet Take 1 tablet (40 mg total) by mouth daily. 90 tablet 3  . betamethasone valerate ointment (VALISONE) 0.1 % Apply 1 application 2 (two) times daily topically. (Patient taking differently: Apply 1  application topically 2 (two) times daily as needed (irritation). ) 45 g 1  . fish oil-omega-3 fatty acids 1000 MG capsule Take 2 g by mouth daily.    [name] gabapentin (NEURONTIN) 600 MG tablet Take 1 tablet (600 mg total) by mouth at bedtime. 90 tablet 5  . metoprolol tartrate (LOPRESSOR) 25 MG tablet Take 0.5 tablets (12.5 mg total) by mouth 2 (two) times daily. 60 tablet 1  . Multiple Vitamin (MULTIVITAMIN) tablet Take 1 tablet by mouth daily.    . nicotine (NICODERM CQ - DOSED IN MG/24 HOURS) 14 mg/24hr patch Place 1 patch (14 mg total) onto the skin daily. 28 patch 0  . omeprazole (PRILOSEC) 20 MG capsule Take 20 mg by mouth daily as needed.     [name] oxyCODONE (OXY IR/ROXICODONE) 5 MG immediate release tablet Take 1 tablet (5 mg total) by mouth every 6 (six) hours as needed for severe pain. 30 tablet 0  . umeclidinium bromide (INCRUSE ELLIPTA) 62.5 MCG/INH AEPB Inhale 1 puff into the lungs daily. 1 each 11   No current facility-administered medications for this visit.     Allergies:   Lactose intolerance (gi)     ROS:  Please see the history of present illness.   Otherwise, review of systems are positive for none.   All other systems are reviewed and negative.    PHYSICAL EXAM: VS:  BP 140/78   Pulse [name])  59   Ht 6' (1.829 m)   Wt 117 lb (53.1 kg)   BMI 15.87 kg/m  , BMI Body mass index is 15.87 kg/m. GENERAL:  Well appearing, thin is a rail HEENT:  Pupils equal round and reactive, fundi not visualized, oral mucosa unremarkable NECK:  No jugular venous distention, waveform within normal limits, carotid upstroke brisk and symmetric, no bruits, no thyromegaly LUNGS:  Clear to auscultation bilaterally BACK:  No CVA tenderness, healing left thoracotomy scar CHEST:  Unremarkable HEART:  PMI not displaced or sustained,S1 and S2 within normal limits, no S3, no S4, no clicks, no rubs, no murmurs ABD:  Flat, positive bowel sounds normal in frequency in pitch, no bruits, no rebound, no  guarding, no midline pulsatile mass, no hepatomegaly, no splenomegaly EXT:  2 plus pulses throughout, no edema, no cyanosis no clubbing   EKG:  EKG is ordered today. The ekg ordered today demonstrates sinus rhythm, rate 69, axis within normal limits, intervals within normal limits, no acute ST-T wave changes.   Recent Labs: 01/30/2018: ALT 88; BUN 8; Creatinine, Ser 1.18; Hemoglobin 10.9; Platelets 561; Potassium 5.4; Sodium 135    Lipid Panel    Component Value Date/Time   CHOL 232 (H) 12/12/2016 1111   TRIG 97 12/12/2016 1111   HDL 50 12/12/2016 1111   CHOLHDL 4.6 12/12/2016 1111   CHOLHDL 3.9 04/18/2010 0545   VLDL 12 04/18/2010 0545   LDLCALC 163 (H) 12/12/2016 1111      Wt Readings from Last 3 Encounters:  01/31/18 117 lb (53.1 kg)  01/30/18 119 lb (54 kg)  01/19/18 128 lb 12.8 oz (58.4 kg)      Other studies Reviewed: Additional studies/ records that were reviewed today include: Hospital records. Review of the above records demonstrates:  Please see elsewhere in the note.     ASSESSMENT AND PLAN:  ATRIAL FLUTTER:    He has had no recurrence of this.  I think this was postoperative.  He can stop his amiodarone and his beta-blocker when he runs out of the current prescription.  He can call if he has any recurrent tachypalpitations.  HTN: Blood pressure is slightly elevated.  He will keep a blood pressure diary and he does have a cuff at home.   Current medicines are reviewed at length with the patient today.  The patient does not have concerns regarding medicines.  The following changes have been made:  no change  Labs/ tests ordered today include: None  Orders Placed This Encounter  Procedures  . EKG 12-Lead     Disposition:   FU with if needed.      Signed, Minus Breeding, MD  01/31/2018 9:38 AM    [facility]

## 2018-01-30 NOTE — Patient Instructions (Addendum)
Curtis Henry,  I'm so glad you are recovering from your procedure.  Your lungs sound clear. I do not hear any extra fluid. Continue your breathing exercises. Go to the emergency department for any sudden shortness of breath.  For helping stay off cigars, I recommend a 14 mg nicotine patch. If you still have significant cravings, you could jump up to the 21 mg patch. Decrease to the lower patch strength (7 mg) after a month. Taking nicotine gum or lozenges at the same time may help as well.   Please ask your cardiothoracic surgeon if you need more than yearly low dose CT scan for monitoring and when it is safe to return to work.  Your heart doctor at your appointment tomorrow will help decide when you can stop amiodarone.  Try tramadol instead of oxycodone to see if confusion improves.  Taking tylenol 500 mg every 3 hours may also help reduce narcotic pain medication need.  Best, Dr. Ola Spurr  Delirium Delirium is a state of mental confusion. It comes on quickly and causes significant changes in a person's thinking and behavior. People with delirium usually have trouble paying attention to what is going on or knowing where they are. They may become very withdrawn or very emotional and unable to sit still. They may even see or feel things that are not there (hallucinations). Delirium is a sign of a serious underlying medical condition. What are the causes? Delirium occurs when something suddenly affects the signals that the brain sends out. Brain signals can be affected by anything that puts severe stress on the body and brain and causes brain chemicals to be out of balance. The most common causes of delirium include:  Infections. These may be bacterial, viral, fungal, or protozoal.  Medicines. These include many over-the-counter and prescription medicines.  Recreational drugs.  Substance withdrawal. This occurs with sudden discontinuation of alcohol, certain medicines, or recreational  drugs.  Surgery.  Sudden vascular events, such as stroke, brain hemorrhage, and severe migraine.  Other brain disorders, such as tumors, seizures, and physical head trauma.  Metabolic disorders, such as kidney or liver failure.  Low blood oxygen (anoxia). This may occur with lung disease, cardiac arrest, or carbon monoxide poisoning.  Hormone imbalances (endocrinopathies), such as an overactive thyroid (hyperthyroidism) or underactive thyroid (hypothyroidism).  Vitamin deficiencies.  What increases the risk? This condition is more likely to develop in:  Children.  Older people.  People who live alone.  People who have vision loss or hearing loss.  People who have existing brain disease, such as dementia.  People who have long-lasting (chronic) medical conditions, such as heart disease.  People who are hospitalized for long periods of time.  What are the signs or symptoms? Delirium starts with a sudden change in a person's thinking or behavior. Symptoms come and go (fluctuate) over time, and they are often worse at the end of the day. Symptoms include:  Not being able to stay awake (drowsiness) or pay attention.  Being confused about places, time, and people.  Forgetfulness.  Having extreme energy levels. These may be low or high.  Changes in sleep patterns.  Extreme mood swings, such as anger or anxiety.  Focusing on things or ideas that are not important.  Rambling and senseless talking.  Difficulty speaking, understanding speech, or both.  Hallucinations.  Tremor or unsteady gait.  How is this diagnosed? People with delirium may not realize that they have the condition. Often, a family member or health care provider  is the first person to notice the changes. The health care provider will obtain a detailed history of current symptoms, medical issues, medicines, and recreational drug use. The health care provider will perform a mental status examination  by:  Asking questions to check for confusion.  Watching for abnormal behavior.  The health care provider may perform a physical exam and order lab tests or additional studies to determine the cause of the delirium. How is this treated? Treatment of delirium depends on the cause and severity. Delirium usually goes away within days or weeks of treating the underlying cause. In the meantime, the person should not be left alone because he or she may accidentally cause self-harm. Treatment includes supportive care, such as:  Increased light during the day and decreased light at night.  Low noise level.  Uninterrupted sleep.  A regular daily schedule.  Clocks and calendars to help with orientation.  Familiar objects, including the person's pictures and clothing.  Frequent visits from familiar family and friends.  Healthy diet.  Exercise.  In more severe cases of delirium, medicine may be prescribed to help the person to keep calm and think more clearly. Follow these instructions at home:  Any supportive care should be continued as told by the health care provider.  All medicines should be used as told by the health care provider. This is important.  The health care provider should be consulted before over-the-counter medicines, herbs, or supplements are used.  All follow-up visits should be kept as told by the health care provider. This is important.  Alcohol and recreational drugs should be avoided as told by the health care provider. Contact a health care provider if:  Symptoms do not get better or they become worse.  New symptoms of delirium develop.  Caring for the person at home does not seem safe.  Eating, drinking, or communicating stops.  There are side effects of medicines, such as changes in sleep patterns, dizziness, weight gain, restlessness, movement changes, or tremors. Get help right away if:  Serious thoughts occur about self-harm or about hurting  others.  There are serious side effects of medicine, such as: ? Swelling of the face, lips, tongue, or throat. ? Fever, confusion, muscle spasms, or seizures. This information is not intended to replace advice given to you by your health care provider. Make sure you discuss any questions you have with your health care provider. Document Released: 05/28/2012 Document Revised: 02/09/2016 Document Reviewed: 10/27/2014 Elsevier Interactive Patient Education  2018 [place]

## 2018-01-31 ENCOUNTER — Encounter: Payer: Self-pay | Admitting: Cardiology

## 2018-01-31 ENCOUNTER — Ambulatory Visit: Payer: BLUE CROSS/BLUE SHIELD | Admitting: Cardiology

## 2018-01-31 VITALS — BP 140/78 | HR 59 | Ht 72.0 in | Wt 117.0 lb

## 2018-01-31 DIAGNOSIS — I480 Paroxysmal atrial fibrillation: Secondary | ICD-10-CM | POA: Diagnosis not present

## 2018-01-31 DIAGNOSIS — I10 Essential (primary) hypertension: Secondary | ICD-10-CM

## 2018-01-31 HISTORY — DX: Essential (primary) hypertension: I10

## 2018-01-31 LAB — CBC
Hematocrit: 33.1 % — ABNORMAL LOW (ref 37.5–51.0)
Hemoglobin: 10.9 g/dL — ABNORMAL LOW (ref 13.0–17.7)
MCH: 28.6 pg (ref 26.6–33.0)
MCHC: 32.9 g/dL (ref 31.5–35.7)
MCV: 87 fL (ref 79–97)
PLATELETS: 561 10*3/uL — AB (ref 150–379)
RBC: 3.81 x10E6/uL — ABNORMAL LOW (ref 4.14–5.80)
RDW: 15.1 % (ref 12.3–15.4)
WBC: 11.2 10*3/uL — AB (ref 3.4–10.8)

## 2018-01-31 LAB — COMPREHENSIVE METABOLIC PANEL
A/G RATIO: 1.1 — AB (ref 1.2–2.2)
ALT: 88 IU/L — AB (ref 0–44)
AST: 73 IU/L — AB (ref 0–40)
Albumin: 3.4 g/dL — ABNORMAL LOW (ref 3.6–4.8)
Alkaline Phosphatase: 153 IU/L — ABNORMAL HIGH (ref 39–117)
BUN/Creatinine Ratio: 7 — ABNORMAL LOW (ref 10–24)
BUN: 8 mg/dL (ref 8–27)
Bilirubin Total: 0.5 mg/dL (ref 0.0–1.2)
CALCIUM: 9.4 mg/dL (ref 8.6–10.2)
CHLORIDE: 92 mmol/L — AB (ref 96–106)
CO2: 28 mmol/L (ref 20–29)
Creatinine, Ser: 1.18 mg/dL (ref 0.76–1.27)
GFR calc Af Amer: 76 mL/min/{1.73_m2} (ref >59)
GFR, EST NON AFRICAN AMERICAN: 66 mL/min/{1.73_m2} (ref >59)
GLUCOSE: 115 mg/dL — AB (ref 65–99)
Globulin, Total: 3.2 g/dL (ref 1.5–4.5)
POTASSIUM: 5.4 mmol/L — AB (ref 3.5–5.2)
Sodium: 135 mmol/L (ref 134–144)
Total Protein: 6.6 g/dL (ref 6.0–8.5)

## 2018-01-31 NOTE — Patient Instructions (Signed)
Medication Instructions:  STOP Amiodarone and Metoprolol when bottle is complete  If you need a refill on your cardiac medications before your next appointment, please call your pharmacy.  Labwork: None Ordered  Testing/Procedures: None Ordered  Follow-Up: Your physician wants you to follow-up in: As Needed.      Thank you for choosing [facility] at [facility]!!

## 2018-02-02 ENCOUNTER — Encounter: Payer: Self-pay | Admitting: Internal Medicine

## 2018-02-02 NOTE — Assessment & Plan Note (Signed)
-   Recommended 14 mg daily patch with or without lozenges based on cravings. May need to titrate up because was smoking up to 5 cigars daily earlier this year.

## 2018-02-02 NOTE — Assessment & Plan Note (Signed)
-   NSR today off of amiodarone. Has Cardiology follow-up tomorrow. Anticipate discontinuation of medication now that he is recovering from his procedure.

## 2018-02-02 NOTE — Progress Notes (Signed)
[facility] Progress Note  Subjective:  Curtis Henry is a 62 y.o. male with history of tobacco abuse, low weight, GERD, and LLL pulmonary nodules now s/p left VATS and left lower lobectomy on 01/17/18 who presents for hospital follow-up. He was admitted from 5/3-5/14/19. Hospitalization was complicated by need for blood transfusion, hyponatremia, and paroxysmal afib. He was discharged on amiodarone and metoprolol but has been unable to get the amiodarone filled due to lack of supply. He reports fatigue and pain near left scapula. He is taking tramadol for pain and reports some confusion overnight. He is also taking scheduled tylenol. He is using an incentive spirometer regularly. He has abstained from smoking for over 2 months but says family members complained his mood was very irritable on chantix, so he would like to stop this and try nicotine patches because he continues to have cravings. He reports good appetite despite weight loss while hospitalized. ROS: No fevers, no shortness of breath  Of note, eating has been limited by poor dentition. He says his daughter who lives in [place] has found a more affordable dentist for him to see once he has recovered more and that she will split cost with him.    Allergies  Allergen Reactions  . Lactose Intolerance (Gi) Nausea Only and Other (See Comments)    Unsettled bowel.     Social History   Tobacco Use  . Smoking status: Former Smoker    Packs/day: 0.10    Years: 42.00    Pack years: 4.20    Types: E-cigarettes, Cigars    Start date: 09/17/1974  . Smokeless tobacco: Never Used  . Tobacco comment: Quit one month ago.   Substance Use Topics  . Alcohol use: No    Comment: occ.    Objective: Blood pressure 122/60, pulse 68, temperature 98.6 F (37 C), temperature source Oral, height 6' (1.829 m), weight 119 lb (54 kg), SpO2 99 %. Body mass index is 16.14 kg/m. Constitutional: Very pleasant, thin male in NAD HENT:  MMM, poor dentition Cardiovascular: RRR, S1, S2, no m/r/g.  Pulmonary/Chest: Effort normal and breath sounds normal.  Musculoskeletal: No LE edema. Increased muscle tension with TTP over left mid-trapezius.  Neurological: AOx3, no focal deficits. Skin: Skin is warm and dry. No rash noted. Clean bandage in place over left thoracic region.  Psychiatric: Normal mood and affect.  Vitals reviewed  Assessment/Plan: S/P lobectomy of lung - Recovering well. Denies dyspnea. Having some muscle discomfort.  - Will check CMP and CBC due to hyponatremia and anemia while hospitalized and to check nutrition status with albumin.  - Continue incentive spirometry. - Has follow-up with Cardiothoracic surgery next week for check-up with CXR and suture removal. Asked patient to ask if he needs more surveillance beyond yearly low-dose CT scans.  - Recommended application of salonpas patch over area of increased muscle tension near left shoulder blade and to continue scheduled tylenol. Counseled on causes of delirium and recommended limiting narcotics as able.   Paroxysmal atrial fibrillation (HCC) - NSR today off of amiodarone. Has Cardiology follow-up tomorrow. Anticipate discontinuation of medication now that he is recovering from his procedure.   Tobacco abuse - Recommended 14 mg daily patch with or without lozenges based on cravings. May need to titrate up because was smoking up to 5 cigars daily earlier this year.   Follow-up prn.  [name], MD [employer], PGY-3

## 2018-02-02 NOTE — Assessment & Plan Note (Addendum)
-   Recovering well. Denies dyspnea. Having some muscle discomfort.  - Will check CMP and CBC due to hyponatremia and anemia while hospitalized and to check nutrition status with albumin.  - Continue incentive spirometry. - Has follow-up with Cardiothoracic surgery next week for check-up with CXR and suture removal. Asked patient to ask if he needs more surveillance beyond yearly low-dose CT scans.  - Recommended application of salonpas patch over area of increased muscle tension near left shoulder blade and to continue scheduled tylenol. Counseled on causes of delirium and recommended limiting narcotics as able.

## 2018-02-04 ENCOUNTER — Other Ambulatory Visit: Payer: Self-pay | Admitting: Cardiothoracic Surgery

## 2018-02-04 DIAGNOSIS — Z902 Acquired absence of lung [part of]: Secondary | ICD-10-CM

## 2018-02-05 ENCOUNTER — Ambulatory Visit: Payer: BLUE CROSS/BLUE SHIELD | Admitting: Cardiothoracic Surgery

## 2018-02-13 ENCOUNTER — Other Ambulatory Visit: Payer: Self-pay

## 2018-02-13 ENCOUNTER — Encounter: Payer: Self-pay | Admitting: Cardiothoracic Surgery

## 2018-02-13 ENCOUNTER — Ambulatory Visit (INDEPENDENT_AMBULATORY_CARE_PROVIDER_SITE_OTHER): Payer: Self-pay | Admitting: Cardiothoracic Surgery

## 2018-02-13 ENCOUNTER — Ambulatory Visit
Admission: RE | Admit: 2018-02-13 | Discharge: 2018-02-13 | Disposition: A | Discharge status: Home / Self Care | Payer: BLUE CROSS/BLUE SHIELD | Source: Ambulatory Visit | Attending: Cardiothoracic Surgery | Admitting: Cardiothoracic Surgery

## 2018-02-13 VITALS — BP 150/88 | HR 87 | Resp 18 | Ht 72.0 in | Wt 118.8 lb

## 2018-02-13 DIAGNOSIS — Z902 Acquired absence of lung [part of]: Secondary | ICD-10-CM

## 2018-02-13 DIAGNOSIS — J90 Pleural effusion, not elsewhere classified: Secondary | ICD-10-CM | POA: Diagnosis not present

## 2018-02-13 MED ORDER — GABAPENTIN 100 MG PO CAPS: 400.0000 mg | ORAL_CAPSULE | Freq: Every day | ORAL | Status: DC

## 2018-02-13 NOTE — Progress Notes (Signed)
PCP is Rogue Bussing, MD Referring Provider is Larey Seat*  Chief Complaint  Patient presents with  . Lung Cancer    f/u, s/p VATS/ Lobectomy 01/17/2018    HPI: 1 month follow-up after left lower lobectomy for stage I non-small cell carcinoma.  Patient's hospitalization was prolonged because of persistent air leak.  Chest tube was removed prior to discharge and patient has been doing well.  He is not resume smoking.  He is using Tylenol and gabapentin to control pain.  Surgical incisions are healing well.  Chest tube sutures removed in the office today.  He denies significant shortness of breath.  Appetite is improved and he has gained weight.  Chest x-ray today shows no pneumothorax.  There is some lateral pleural thickening which is stable.   Past Medical History:  Diagnosis Date  . Bursitis of left shoulder   . GERD (gastroesophageal reflux disease)   . Hyperlipidemia   . Restless leg syndrome   . Squamous cell lung cancer [facility])     Past Surgical History:  Procedure Laterality Date  . CIRCUMCISION  12/13/2003  . COLONOSCOPY    . VIDEO ASSISTED THORACOSCOPY (VATS)/ LOBECTOMY Left 01/17/2018   Procedure: left VIDEO ASSISTED THORACOSCOPY (VATS) with left lower lobe wedge resection and LOBECTOMY;  Surgeon: Ivin Poot, MD;  Location: [facility] OR;  Service: Thoracic;  Laterality: Left;    Family History  Problem Relation Age of Onset  . Esophageal cancer Brother   . Colon cancer Father 78  . Arthritis Unknown     Social History Social History   Tobacco Use  . Smoking status: Former Smoker    Packs/day: 0.10    Years: 42.00    Pack years: 4.20    Types: E-cigarettes, Cigars    Start date: 09/17/1974  . Smokeless tobacco: Never Used  . Tobacco comment: Quit one month ago.   Substance Use Topics  . Alcohol use: No    Comment: occ.  . Drug use: Yes    Frequency: 7.0 times per week    Types: Marijuana    Comment: crack use distant past /weed  yesterday-pt will stop until surgery    Current Outpatient Medications  Medication Sig Dispense Refill  . Amino Acids-Protein Hydrolys (FEEDING SUPPLEMENT, PRO-STAT SUGAR FREE 64,) LIQD Take 30 mLs by mouth 2 (two) times daily. 900 mL 0  . atorvastatin (LIPITOR) 40 MG tablet Take 1 tablet (40 mg total) by mouth daily. 90 tablet 3  . betamethasone valerate ointment (VALISONE) 0.1 % Apply 1 application 2 (two) times daily topically. (Patient taking differently: Apply 1 application topically 2 (two) times daily as needed (irritation). ) 45 g 1  . fish oil-omega-3 fatty acids 1000 MG capsule Take 2 g by mouth daily.    . Multiple Vitamin (MULTIVITAMIN) tablet Take 1 tablet by mouth daily.    . nicotine (NICODERM CQ - DOSED IN MG/24 HOURS) 14 mg/24hr patch Place 1 patch (14 mg total) onto the skin daily. 28 patch 0  . omeprazole (PRILOSEC) 20 MG capsule Take 20 mg by mouth daily as needed.     [name] oxyCODONE (OXY IR/ROXICODONE) 5 MG immediate release tablet Take 1 tablet (5 mg total) by mouth every 6 (six) hours as needed for severe pain. 30 tablet 0  . umeclidinium bromide (INCRUSE ELLIPTA) 62.5 MCG/INH AEPB Inhale 1 puff into the lungs daily. 1 each 11   Current Facility-Administered Medications  Medication Dose Route Frequency Provider Last Rate Last Dose  .  gabapentin (NEURONTIN) capsule 400 mg  400 mg Oral QHS Ivin Poot, MD        Allergies  Allergen Reactions  . Lactose Intolerance (Gi) Nausea Only and Other (See Comments)    Unsettled bowel.     Review of Systems  No fever No hemoptysis Gaining weight and strength   BP (!) 150/88   Pulse 87   Resp 18   Ht 6' (1.829 m)   Wt 118 lb 12.8 oz (53.9 kg)   SpO2 98% Comment: RA  BMI 16.11 kg/m  Physical Exam      Exam    General- alert and comfortable    Neck- no JVD, no cervical adenopathy palpable, no carotid bruit   Lungs- clear without rales, wheezes   Cor- regular rate and rhythm, no murmur , gallop   Abdomen- soft,  non-tender   Extremities - warm, non-tender, minimal edema   Neuro- oriented, appropriate, no focal weakness   Diagnostic Tests: Chest x-ray images personally reviewed showing no pneumothorax lung well expanded no pleural effusion  Impression: Doing well 1 month postop left lower lobectomy for cancer The patient may drive and lift up to 10 to 15 pounds. He was given a return to work note for early June. He is commended on his successful smoking cessation Refill for gabapentin was provided Plan: Return with follow-up chest x-ray in 1 month to assess progress.  No adjunctive chemotherapy is planned for stage IA squamous cell cancer  Len Childs, MD [facility] [phone]

## 2018-02-14 ENCOUNTER — Other Ambulatory Visit: Payer: Self-pay

## 2018-02-14 DIAGNOSIS — M792 Neuralgia and neuritis, unspecified: Secondary | ICD-10-CM

## 2018-02-14 MED ORDER — GABAPENTIN 400 MG PO CAPS: 400.0000 mg | ORAL_CAPSULE | Freq: Every day | ORAL | 1 refills | Status: DC

## 2018-02-17 ENCOUNTER — Telehealth: Payer: Self-pay

## 2018-02-17 NOTE — Telephone Encounter (Signed)
Pt's wife calling regarding husband. States that the "itch cream" he was given isn't working and wanting to know if we can change or what else they should do. Call back [phone] Wallace Cullens, RN

## 2018-02-17 NOTE — Telephone Encounter (Signed)
Patient's wife called and stated that Mr. Curtis Henry requested a refill of the oxycodone.  I advised her that the medication that was given at his last appointment with Dr. Prescott Gum was appropriate medication for incisional neuropathy.  She stated that she did not realize this and she would tell him to take the medication.  She acknowledged receipt and all questions were answered.

## 2018-02-19 NOTE — Telephone Encounter (Signed)
Patient will need to make an appointment to be evaluated.

## 2018-02-19 NOTE — Telephone Encounter (Signed)
Pt wife informed and pt was scheduled for an appt. Blessin Kanno Kennon Holter, CMA

## 2018-02-20 ENCOUNTER — Ambulatory Visit: Payer: BLUE CROSS/BLUE SHIELD | Admitting: Student

## 2018-02-20 NOTE — Progress Notes (Signed)
z  Subjective:    Patient ID: Curtis Henry, male    DOB: 1955/10/22, 62 y.o.   MRN: [id number]   CC: Rash  HPI: Rash Patient reports rash that has been persistent for over 1 year.  Says he was seen by Dr. Avon Gully 1 year ago and given betamethasone valerate ointment.  Ointment helps for a brief period of time, 1 week, and then rashes reappear.  States rash is present on his neck, arms, shoulders, knees, and calves.  States that it is very itchy.  Denies any drainage or bleeding.  States some skin hypopigmentation in the area, is unsure if it is from scratching or the cream.  Now hypopigmentation is occurring on his chest and he has not scratch that area or put cream there.  No changes in detergent, lotions, creams, soaps.  No chemical exposure.  No known irritants.  Smoking status reviewed.  Recently quit smoking (2 months ago).   Objective:  BP 128/78   Pulse 74   Temp 98.7 F (37.1 C) (Oral)   Ht 6' (1.829 m)   Wt 128 lb (58.1 kg)   SpO2 97%   BMI 17.36 kg/m  Vitals and nursing note reviewed  General: well nourished, in no acute distress HEENT: normocephalic, moist mucous membranes Neck: supple, non-tender, without lymphadenopathy Cardiac: RRR, clear S1 and S2, no murmurs, rubs, or gallops Respiratory: clear to auscultation bilaterally, no increased work of breathing Abdomen: soft, nontender, nondistended, no masses or organomegaly. Bowel sounds present Extremities: no edema or cyanosis. Warm, well perfused. 2+ radial pulses bilaterally Skin: warm and dry  Neuro: alert and oriented, no focal deficits   Assessment & Plan:    Rash and nonspecific skin eruption Unclear etiology at this time.  No irritant exposure or change in detergents, soaps, or lotions.  Rash seems to be getting worse and becoming more itchy despite the use of steroid cream.  Possible etiologies include idiopathic guttate hypomelanosis, confluence and reticulate papillomatosis, however still unclear  etiology.  Patient would benefit from visit in dermatology clinic for possible biopsy. -Follow-up in dermatology clinic in 1 week -Zyrtec daily to help with itching -Betamethasone twice a day as needed to affected areas  Patient discussed with Dr. Gwendlyn Deutscher who also saw patient in clinic today  Tobacco abuse Patient quit smoking 2 months ago.  Congratulated on success.  Patient stated he plans on remaining smoke free.  No additional cessation resources needed at this time    Return in about 1 week (around 02/28/2018) for dermatology clinic.   Caroline More, DO, PGY-1

## 2018-02-21 ENCOUNTER — Other Ambulatory Visit: Payer: Self-pay

## 2018-02-21 ENCOUNTER — Ambulatory Visit: Payer: BLUE CROSS/BLUE SHIELD | Admitting: Family Medicine

## 2018-02-21 ENCOUNTER — Encounter: Payer: Self-pay | Admitting: Family Medicine

## 2018-02-21 VITALS — BP 128/78 | HR 74 | Temp 98.7°F | Ht 72.0 in | Wt 128.0 lb

## 2018-02-21 DIAGNOSIS — R21 Rash and other nonspecific skin eruption: Secondary | ICD-10-CM | POA: Diagnosis not present

## 2018-02-21 DIAGNOSIS — Z720 Tobacco use: Secondary | ICD-10-CM

## 2018-02-21 DIAGNOSIS — L299 Pruritus, unspecified: Secondary | ICD-10-CM

## 2018-02-21 MED ORDER — BETAMETHASONE VALERATE 0.1 % EX OINT: 1.0000 "application " | TOPICAL_OINTMENT | Freq: Two times a day (BID) | CUTANEOUS | 0 refills | Status: DC | PRN

## 2018-02-21 MED ORDER — CETIRIZINE HCL 10 MG PO TABS: 10.0000 mg | ORAL_TABLET | Freq: Every day | ORAL | 1 refills | Status: DC

## 2018-02-21 NOTE — Assessment & Plan Note (Signed)
Unclear etiology at this time.  No irritant exposure or change in detergents, soaps, or lotions.  Rash seems to be getting worse and becoming more itchy despite the use of steroid cream.  Possible etiologies include idiopathic guttate hypomelanosis, confluence and reticulate papillomatosis, however still unclear etiology.  Patient would benefit from visit in dermatology clinic for possible biopsy. -Follow-up in dermatology clinic in 1 week -Zyrtec daily to help with itching -Betamethasone twice a day as needed to affected areas  Patient discussed with Dr. Gwendlyn Deutscher who also saw patient in clinic today

## 2018-02-21 NOTE — Patient Instructions (Signed)
It was a pleasure seeing you today.   Today we discussed your rash  For your rash: I want you to follow-up in dermatology clinic.  This is [facility] at the [facility].  At this visit we will likely get a biopsy of the rash which means taking a small sample of your skin to test for what may be causing this rash.  In the meantime I want you to take a daily Zyrtec (take 1 pill once a day) to help with itching.  Please follow up in 1 week in dermatology clinic or sooner if symptoms persist or worsen. Please call the clinic immediately if you have any concerns.   Our clinic's number is [phone]. Please call with questions or concerns.   Thank you,  Caroline More, DO

## 2018-02-21 NOTE — Assessment & Plan Note (Addendum)
Patient quit smoking 2 months ago.  Congratulated on success.  Patient stated he plans on remaining smoke free.  No additional cessation resources needed at this time

## 2018-02-23 ENCOUNTER — Encounter (HOSPITAL_COMMUNITY): Payer: Self-pay | Admitting: Emergency Medicine

## 2018-02-23 ENCOUNTER — Emergency Department (HOSPITAL_COMMUNITY): Payer: BLUE CROSS/BLUE SHIELD

## 2018-02-23 ENCOUNTER — Emergency Department (HOSPITAL_COMMUNITY)
Admission: EM | Admit: 2018-02-23 | Discharge: 2018-02-23 | Disposition: A | Discharge status: Home / Self Care | Payer: BLUE CROSS/BLUE SHIELD | Attending: Emergency Medicine | Admitting: Emergency Medicine

## 2018-02-23 DIAGNOSIS — I10 Essential (primary) hypertension: Secondary | ICD-10-CM | POA: Insufficient documentation

## 2018-02-23 DIAGNOSIS — F419 Anxiety disorder, unspecified: Secondary | ICD-10-CM | POA: Diagnosis not present

## 2018-02-23 DIAGNOSIS — Z87891 Personal history of nicotine dependence: Secondary | ICD-10-CM | POA: Diagnosis not present

## 2018-02-23 DIAGNOSIS — R05 Cough: Secondary | ICD-10-CM | POA: Diagnosis not present

## 2018-02-23 DIAGNOSIS — I480 Paroxysmal atrial fibrillation: Secondary | ICD-10-CM | POA: Diagnosis not present

## 2018-02-23 DIAGNOSIS — E785 Hyperlipidemia, unspecified: Secondary | ICD-10-CM | POA: Diagnosis not present

## 2018-02-23 DIAGNOSIS — R197 Diarrhea, unspecified: Secondary | ICD-10-CM | POA: Insufficient documentation

## 2018-02-23 DIAGNOSIS — Z79899 Other long term (current) drug therapy: Secondary | ICD-10-CM | POA: Diagnosis not present

## 2018-02-23 DIAGNOSIS — R202 Paresthesia of skin: Secondary | ICD-10-CM | POA: Diagnosis not present

## 2018-02-23 DIAGNOSIS — E7800 Pure hypercholesterolemia, unspecified: Secondary | ICD-10-CM | POA: Insufficient documentation

## 2018-02-23 DIAGNOSIS — R059 Cough, unspecified: Secondary | ICD-10-CM

## 2018-02-23 DIAGNOSIS — Z041 Encounter for examination and observation following transport accident: Secondary | ICD-10-CM | POA: Diagnosis not present

## 2018-02-23 DIAGNOSIS — R0602 Shortness of breath: Secondary | ICD-10-CM | POA: Diagnosis not present

## 2018-02-23 LAB — COMPREHENSIVE METABOLIC PANEL
ALT: 25 U/L (ref 17–63)
ANION GAP: 11 (ref 5–15)
AST: 30 U/L (ref 15–41)
Albumin: 3.1 g/dL — ABNORMAL LOW (ref 3.5–5.0)
Alkaline Phosphatase: 129 U/L — ABNORMAL HIGH (ref 38–126)
BUN: <5 mg/dL — ABNORMAL LOW (ref 6–20)
CHLORIDE: 102 mmol/L (ref 101–111)
CO2: 26 mmol/L (ref 22–32)
CREATININE: 1.06 mg/dL (ref 0.61–1.24)
Calcium: 9.1 mg/dL (ref 8.9–10.3)
Glucose, Bld: 104 mg/dL — ABNORMAL HIGH (ref 65–99)
Potassium: 4.8 mmol/L (ref 3.5–5.1)
SODIUM: 139 mmol/L (ref 135–145)
Total Bilirubin: 0.7 mg/dL (ref 0.3–1.2)
Total Protein: 8.1 g/dL (ref 6.5–8.1)

## 2018-02-23 LAB — I-STAT VENOUS BLOOD GAS, ED
ACID-BASE EXCESS: 4 mmol/L — AB (ref 0.0–2.0)
BICARBONATE: 28 mmol/L (ref 20.0–28.0)
O2 Saturation: 80 %
TCO2: 29 mmol/L (ref 22–32)
pCO2, Ven: 38.7 mmHg — ABNORMAL LOW (ref 44.0–60.0)
pH, Ven: 7.467 — ABNORMAL HIGH (ref 7.250–7.430)
pO2, Ven: 41 mmHg (ref 32.0–45.0)

## 2018-02-23 LAB — I-STAT CG4 LACTIC ACID, ED
LACTIC ACID, VENOUS: 2.66 mmol/L — AB (ref 0.5–1.9)
Lactic Acid, Venous: 1.7 mmol/L (ref 0.5–1.9)

## 2018-02-23 LAB — CBC WITH DIFFERENTIAL/PLATELET
BASOS PCT: 1 %
Basophils Absolute: 0.1 10*3/uL (ref 0.0–0.1)
Eosinophils Absolute: 0.6 10*3/uL (ref 0.0–0.7)
Eosinophils Relative: 7 %
HCT: 40.2 % (ref 39.0–52.0)
Hemoglobin: 12.6 g/dL — ABNORMAL LOW (ref 13.0–17.0)
LYMPHS ABS: 2 10*3/uL (ref 0.7–4.0)
Lymphocytes Relative: 22 %
MCH: 27 pg (ref 26.0–34.0)
MCHC: 31.3 g/dL (ref 30.0–36.0)
MCV: 86.1 fL (ref 78.0–100.0)
MONO ABS: 0.9 10*3/uL (ref 0.1–1.0)
Monocytes Relative: 10 %
NEUTROS ABS: 5.4 10*3/uL (ref 1.7–7.7)
Neutrophils Relative %: 60 %
PLATELETS: 373 10*3/uL (ref 150–400)
RBC: 4.67 MIL/uL (ref 4.22–5.81)
RDW: 14.9 % (ref 11.5–15.5)
WBC: 9 10*3/uL (ref 4.0–10.5)

## 2018-02-23 LAB — I-STAT TROPONIN, ED: TROPONIN I, POC: 0.01 ng/mL (ref 0.00–0.08)

## 2018-02-23 LAB — LIPASE, BLOOD: LIPASE: 32 U/L (ref 11–51)

## 2018-02-23 MED ORDER — SODIUM CHLORIDE 0.9 % IV BOLUS
1000.0000 mL | Freq: Once | INTRAVENOUS | Status: AC
Start: 2018-02-23 — End: 2018-02-23
  Administered 2018-02-23: 1000 mL via INTRAVENOUS

## 2018-02-23 MED ORDER — ACETAMINOPHEN 500 MG PO TABS
1000.0000 mg | ORAL_TABLET | Freq: Once | ORAL | Status: AC
  Administered 2018-02-23: 1000 mg via ORAL
  Filled 2018-02-23: qty 2
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
## 2018-02-23 NOTE — ED Notes (Signed)
Pt transported to XRAY °

## 2018-03-04 ENCOUNTER — Encounter: Payer: Self-pay | Admitting: Internal Medicine

## 2018-03-04 ENCOUNTER — Ambulatory Visit: Payer: BLUE CROSS/BLUE SHIELD | Admitting: Internal Medicine

## 2018-03-04 ENCOUNTER — Other Ambulatory Visit: Payer: Self-pay

## 2018-03-04 VITALS — BP 110/70 | HR 69 | Temp 98.0°F | Wt 125.0 lb

## 2018-03-04 DIAGNOSIS — R739 Hyperglycemia, unspecified: Secondary | ICD-10-CM

## 2018-03-04 DIAGNOSIS — Z720 Tobacco use: Secondary | ICD-10-CM | POA: Diagnosis not present

## 2018-03-04 DIAGNOSIS — E785 Hyperlipidemia, unspecified: Secondary | ICD-10-CM | POA: Diagnosis not present

## 2018-03-04 DIAGNOSIS — F411 Generalized anxiety disorder: Secondary | ICD-10-CM

## 2018-03-04 LAB — POCT GLYCOSYLATED HEMOGLOBIN (HGB A1C): Hemoglobin A1C: 5.4 % (ref 4.0–5.6)

## 2018-03-04 MED ORDER — NICOTINE 7 MG/24HR TD PT24: 7.0000 mg | MEDICATED_PATCH | Freq: Every day | TRANSDERMAL | 0 refills | Status: DC

## 2018-03-04 MED ORDER — GABAPENTIN 600 MG PO TABS: 600.0000 mg | ORAL_TABLET | Freq: Every day | ORAL | 2 refills | Status: DC

## 2018-03-04 MED ORDER — ATORVASTATIN CALCIUM 40 MG PO TABS: 40.0000 mg | ORAL_TABLET | Freq: Every day | ORAL | 3 refills | Status: DC

## 2018-03-04 NOTE — Progress Notes (Signed)
[facility] Progress Note  Subjective:  Curtis Henry is a 62 y.o. male with history of tobacco abuse, low weight, GERD, and LLL pulmonary nodules now s/p left VATS and left lower lobectomy on 01/17/18 who presents for ED follow-up on 02/23/18 for shortness of breath. He thinks his family overreacted by bringing him to the ED. He had CXR negative for infiltrate or effusion during ED visit and EKG with NSR. Hbg was up to 12.6, up from 10.9 1 month prior. He initially had been tachypneic, but this resolved over course of evaluation; he admits anxiety could have been contributing some. He has been struggling after his lobectomy 01/17/18 with decreased energy and finds it difficult not to be as active as he had been prior to operation, though he has returned to work part-time. He is also stressed about his bills and family fussing over him.  He asks for prescription for more nicotine patches. He has not smoked for 4 months but still has cravings.   Allergies  Allergen Reactions  . Lactose Intolerance (Gi) Nausea Only and Other (See Comments)    Unsettled bowel.    Objective: Blood pressure 110/70, pulse 69, temperature 98 F (36.7 C), temperature source Oral, weight 125 lb (56.7 kg), SpO2 98 %. Body mass index is 16.95 kg/m. Constitutional: Thin male in NAD, pleasant HENT: MMM, poor dentition Cardiovascular: RRR, S1, S2, no m/r/g.  Pulmonary/Chest: Decreased breath sounds over left lower posterior lung field.  Musculoskeletal: No LE edema Psychiatric: Normal mood and affect.  Vitals reviewed  GAD7: 5 (not difficult at all) PHQ9: 12 (somewhat difficult; #9 answer of "not at all")  Assessment/Plan: Anxiety state - Patient struggling with decreased stamina after recent lobectomy and financial strain from not working full time. Could represent adjustment disorder.  - Discussed counseling options, including meeting with [facility], and patient says he will keep these in mind.  - Provided  reassurance that symptoms should continue to improve with time.  Tobacco abuse - Prescribed nicotine patches (7 mg) x 2 months  Also obtained A1c for elevated glucose on recent BMPs--this was normal.   Follow-up prn.  [name], MD [place], PGY-3

## 2018-03-04 NOTE — Patient Instructions (Signed)
Mr. Curtis Henry,  You WILL feel more like yourself in a few months time. Hang in there! If you are having overwhelming stress or would like to speak to someone about managing stress, you can always meet with one of your behavioral health counselors. Please call if you would like to do this.  I have prescribed nicotine patches.  I have increased gabapentin to 600 mg nightly.  I will call if blood sugar result is abnormal.  Best, Dr. Ola Spurr

## 2018-03-08 ENCOUNTER — Encounter: Payer: Self-pay | Admitting: Internal Medicine

## 2018-03-08 DIAGNOSIS — F411 Generalized anxiety disorder: Secondary | ICD-10-CM

## 2018-03-08 HISTORY — DX: Generalized anxiety disorder: F41.1

## 2018-03-08 NOTE — Assessment & Plan Note (Signed)
-   Patient struggling with decreased stamina after recent lobectomy and financial strain from not working full time. Could represent adjustment disorder.  - Discussed counseling options, including meeting with [facility], and patient says he will keep these in mind.  - Provided reassurance that symptoms should continue to improve with time.

## 2018-03-08 NOTE — Assessment & Plan Note (Signed)
-   Prescribed nicotine patches (7 mg) x 2 months

## 2018-03-14 ENCOUNTER — Other Ambulatory Visit: Payer: Self-pay | Admitting: Cardiothoracic Surgery

## 2018-03-14 DIAGNOSIS — Z902 Acquired absence of lung [part of]: Secondary | ICD-10-CM

## 2018-03-19 ENCOUNTER — Other Ambulatory Visit: Payer: Self-pay | Admitting: Family Medicine

## 2018-03-19 ENCOUNTER — Encounter: Payer: BLUE CROSS/BLUE SHIELD | Admitting: Cardiothoracic Surgery

## 2018-03-19 DIAGNOSIS — L299 Pruritus, unspecified: Secondary | ICD-10-CM

## 2018-03-21 ENCOUNTER — Other Ambulatory Visit: Payer: Self-pay

## 2018-03-21 ENCOUNTER — Encounter: Payer: Self-pay | Admitting: Cardiothoracic Surgery

## 2018-03-21 ENCOUNTER — Ambulatory Visit (INDEPENDENT_AMBULATORY_CARE_PROVIDER_SITE_OTHER): Payer: Self-pay | Admitting: Cardiothoracic Surgery

## 2018-03-21 ENCOUNTER — Ambulatory Visit
Admission: RE | Admit: 2018-03-21 | Discharge: 2018-03-21 | Disposition: A | Discharge status: Home / Self Care | Payer: BLUE CROSS/BLUE SHIELD | Source: Ambulatory Visit | Attending: Cardiothoracic Surgery | Admitting: Cardiothoracic Surgery

## 2018-03-21 VITALS — BP 130/82 | HR 80 | Resp 18 | Ht 72.0 in | Wt 132.6 lb

## 2018-03-21 DIAGNOSIS — Z902 Acquired absence of lung [part of]: Secondary | ICD-10-CM

## 2018-03-21 DIAGNOSIS — J929 Pleural plaque without asbestos: Secondary | ICD-10-CM | POA: Diagnosis not present

## 2018-03-21 NOTE — Progress Notes (Signed)
PCP is Patriciaann Clan, DO Referring Provider is Larey Seat*  Chief Complaint  Patient presents with  . Lung Cancer    f/u with chest xray, s/p VATS/ Lobectomy 01/17/18    HPI: Patient returns for postop visit 6 weeks after left lower lobe lobectomy for squamous cell carcinoma stage Ia Doing well with minimal postthoracotomy pain.  Denies shortness of breath. 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 Bursitis of left shoulder   . GERD (gastroesophageal reflux disease)   . Hyperlipidemia   . Restless leg syndrome   . Squamous cell lung cancer [facility])     Past Surgical History:  Procedure Laterality Date  . CIRCUMCISION  12/13/2003  . COLONOSCOPY    . VIDEO ASSISTED THORACOSCOPY (VATS)/ LOBECTOMY Left 01/17/2018   Procedure: left VIDEO ASSISTED THORACOSCOPY (VATS) with left lower lobe wedge resection and LOBECTOMY;  Surgeon: Ivin Poot, MD;  Location: [facility] OR;  Service: Thoracic;  Laterality: Left;    Family History  Problem Relation Age of Onset  . Esophageal cancer Brother   . Colon cancer Father 27  . Arthritis Unknown     Social History Social History   Tobacco Use  . Smoking status: Former Smoker    Packs/day: 0.10    Years: 42.00    Pack years: 4.20    Types: E-cigarettes, Cigars    Start date: 09/17/1974    Last attempt to quit: 12/22/2017    Years since quitting: 0.2  . Smokeless tobacco: Never Used  . Tobacco comment: Quit one month ago.   Substance Use Topics  . Alcohol use: No    Comment: occ.  . Drug use: Yes    Frequency: 7.0 times per week    Types: Marijuana    Comment: crack use distant past /weed yesterday-pt will stop until surgery    Current Outpatient  Medications  Medication Sig Dispense Refill  . Amino Acids-Protein Hydrolys (FEEDING SUPPLEMENT, PRO-STAT SUGAR FREE 64,) LIQD Take 30 mLs by mouth 2 (two) times daily. 900 mL 0  . atorvastatin (LIPITOR) 40 MG tablet Take 1 tablet (40 mg total) by mouth daily. 90 tablet 3  . betamethasone valerate ointment (VALISONE) 0.1 % Apply 1 application topically 2 (two) times daily as needed (irritation). 15 g 0  . cetirizine (ZYRTEC) 10 MG tablet Take 1 tablet (10 mg total) by mouth daily. 30 tablet 1  . ENSURE (ENSURE) Take 237 mLs by mouth 2 (two) times daily between meals.    . fish oil-omega-3 fatty acids 1000 MG capsule Take 2 g by mouth daily.    [name] gabapentin (NEURONTIN) 600 MG tablet Take 1 tablet (600 mg total) by mouth at bedtime. 90 tablet 2  . ibuprofen (ADVIL,MOTRIN) 200 MG tablet Take 400 mg by mouth every 6 (six) hours as needed for mild pain.    . Multiple Vitamin (MULTIVITAMIN) tablet Take 1 tablet by mouth daily.    . nicotine (NICODERM CQ - DOSED IN MG/24 HR) 7 mg/24hr patch Place 1 patch (7 mg total) onto the skin daily. 28 patch 0  . nicotine (NICODERM CQ - DOSED IN MG/24 HR) 7 mg/24hr patch Place 1 patch (7 mg total) onto the skin daily. 28 patch 0  .  omeprazole (PRILOSEC) 20 MG capsule Take 20 mg by mouth daily as needed.     . umeclidinium bromide (INCRUSE ELLIPTA) 62.5 MCG/INH AEPB Inhale 1 puff into the lungs daily. 1 each 11   No current facility-administered medications for this visit.     Allergies  Allergen Reactions  . Lactose Intolerance (Gi) Nausea Only and Other (See Comments)    Unsettled bowel.     Review of Systems  No fever No drainage from incisions Not taking narcotics Using Neurontin at night for restless legs  BP 130/82 (BP Location: Left Arm, Patient Position: Sitting, Cuff Size: Small)   Pulse 80   Resp 18   Ht 6' (1.829 m)   Wt 132 lb 9.6 oz (60.1 kg)   SpO2 98% Comment: RA  BMI 17.98 kg/m  Physical Exam      Exam    General- alert and  comfortable    Neck- no JVD, no cervical adenopathy palpable, no carotid bruit   Lungs- clear without rales, wheezes   Cor- regular rate and rhythm, no murmur , gallop   Abdomen- soft, non-tender   Extremities - warm, non-tender, minimal edema   Neuro- oriented, appropriate, no focal weakness   Diagnostic Tests: Chest x-ray with postop changes of left lower lobectomy without complication  Impression: Excellent early recovery after left lower lobectomy for stage Ia squamous cell carcinoma. Patient is not smoking. 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  He will get a CT scan of the chest in 6 months following lobectomy for surveillance, assess recurrence.  He may lift up to but no more than 25 pounds currently.   Len Childs, MD [facility] [phone]

## 2018-03-27 ENCOUNTER — Encounter: Payer: Self-pay | Admitting: Family Medicine

## 2018-03-27 ENCOUNTER — Other Ambulatory Visit: Payer: Self-pay

## 2018-03-27 ENCOUNTER — Ambulatory Visit (INDEPENDENT_AMBULATORY_CARE_PROVIDER_SITE_OTHER): Payer: BLUE CROSS/BLUE SHIELD | Admitting: Family Medicine

## 2018-03-27 VITALS — BP 110/62 | HR 65 | Temp 98.7°F | Ht 72.0 in | Wt 131.0 lb

## 2018-03-27 DIAGNOSIS — L989 Disorder of the skin and subcutaneous tissue, unspecified: Secondary | ICD-10-CM

## 2018-03-27 DIAGNOSIS — L281 Prurigo nodularis: Secondary | ICD-10-CM | POA: Diagnosis not present

## 2018-03-27 MED ORDER — HYDROCERIN EX CREA: 1.0000 "application " | TOPICAL_CREAM | Freq: Two times a day (BID) | CUTANEOUS | 0 refills | Status: DC

## 2018-03-27 MED ORDER — HYDROXYZINE PAMOATE 25 MG PO CAPS: 25.0000 mg | ORAL_CAPSULE | Freq: Three times a day (TID) | ORAL | 0 refills | Status: DC | PRN

## 2018-03-27 MED ORDER — TRIAMCINOLONE ACETONIDE 0.1 % EX OINT: 1.0000 "application " | TOPICAL_OINTMENT | Freq: Two times a day (BID) | CUTANEOUS | 0 refills | Status: DC

## 2018-03-27 NOTE — Patient Instructions (Signed)

## 2018-03-27 NOTE — Progress Notes (Signed)
Patient ID: Curtis Henry, male   DOB: [birth year], 61 y.o.   MRN: [id number] [facility] ATTENDING NOTE Ronette Deter I  have seen and examined this patient, reviewed their chart. I have discussed this patient with the resident. I agree with the resident's findings, assessment and care plan.  Chronic hypopigmented lesion on an hyperpigmented base scattered on his arms B/L, chest, trunk and upper back.   Verbal and written informed consent obtained for biopsy. I will contact him soon with the pathology report.  See procedure note by the resident who performed the procedure under my supervision.  Topical corticosteroid prescribed.

## 2018-03-27 NOTE — Progress Notes (Signed)
  Subjective:     Curtis Henry is a 62 y.o. male who presents for evaluation of a rash involving the forearm, leg, lower extremity, shoulder, trunk, upper body and upper extremity. Rash started 5 month ago. Lesions are hypopigmented pappules with some erythema, mildly raised in where erythematous and most puritis. Rash has changed over time. Rash is pruritic. Associated symptoms: none. Patient denies: abdominal pain, arthralgia, congestion, cough, crankiness, decrease in appetite, decrease in energy level, fever, headache, irritability, myalgia, nausea, sore throat and vomiting. Patient has not had contacts with similar rash. Patient has not had new exposures (soaps, lotions, laundry detergents, foods, medications, plants, insects or animals). Reports children have traditional psoriasis.   The following portions of the patient's history were reviewed and updated as appropriate: allergies, current medications, past family history, past medical history, past social history, past surgical history and problem list.  Review of Systems Pertinent items noted in HPI and remainder of comprehensive ROS otherwise negative.    Objective:    BP 110/62   Pulse 65   Temp 98.7 F (37.1 C) (Oral)   Ht 6' (1.829 m)   Wt 131 lb (59.4 kg)   SpO2 95%   BMI 17.77 kg/m  General:  alert, cooperative and no distress  Skin:  depigmentation noted on extremities and trunk, papules noted on extremities and trunk and dry scaling skin located on back of thigh and lower extremities, some lesions are traumatized by excroiation, no pus or drainage      PRE-OP DIAGNOSIS: Guttate psoriasis  POST-OP DIAGNOSIS: Pathology pending PROCEDURE: skin biopsy Performing Physician: Dr. Grandville Silos  Supervising Physician (if applicable): Dr. Gwendlyn Deutscher   PROCEDURE:   Punch (Size 2 mm)   The area surrounding the skin lesion was prepared and draped in the usual sterile manner. EThe lesion was removed in the usual manner by the biopsy  method noted above. Hemostasis was assured. The patient tolerated the procedure well.    Closure:    None. Lesion hemostatic after pressure applied.    Followup: The patient tolerated the procedure well without complications.  Standard post-procedure care is explained and return precautions are given.  Assessment and Plan:  Generalized, hypopigmented papules Associated with erythema, pruritis. Reports minimal improvement with topical betamethazone. Condition most consistent with guttate psoriasis. Will increase potency of topical steroid with 0.1% Kenalog ointment and hydroxazine prn. Punch biopsy obtained and sent to pathology for further work up. Pt to return in two weeks for follow up.

## 2018-03-28 ENCOUNTER — Telehealth: Payer: Self-pay

## 2018-03-28 NOTE — Telephone Encounter (Signed)
Missy from [facility] left message on nurse line about patients biopsy site. Per order req, the bx was taken "left upper back." Per bottle label, the bx was taken "upper mid back." Please call her to clarify. [place].

## 2018-03-28 NOTE — Telephone Encounter (Signed)
Joy, from [facility], called stating pts rx for hydroxyzine capsules is on backorder. Gave Joy the verbal ok to switch to the tablets. Ok, per pcp.

## 2018-03-28 NOTE — Telephone Encounter (Signed)
Clarified.

## 2018-04-02 ENCOUNTER — Telehealth: Payer: Self-pay | Admitting: Family Medicine

## 2018-04-02 ENCOUNTER — Encounter: Payer: Self-pay | Admitting: Family Medicine

## 2018-04-02 NOTE — Telephone Encounter (Signed)
I called patient's cell phone. He did not pick up and I was unable to leave a message.  His wife picked up his land-line.  Per his DPR, it is ok to leave a message with his wife.   Skin biopsy report discussed with his wife as well as management plan. F/U with PCP for further management recommendation.   Dr. Higinio Plan,  He has Prurigo Nodularis. In addition to topical steroid, he will benefit from Singulair and Fexofenadine combined.

## 2018-04-14 NOTE — Progress Notes (Signed)
   Subjective:    Patient ID: Curtis Henry, male    DOB: 1956-01-02, 62 y.o.   MRN: [id number]   62 yo gentleman presenting for a follow up after skin biopsy on 03/27/18.   Prurigo Nodularis Patient has a chronic history of pruritic scattered hypopigmented lesions on both of his arms, proximal thighs, trunk, and upper back. Diagnosed as Prurigo Nodularis via punch biopsy on 7/11.   He has been doing well since the procedure.  He is using Kenalog cream twice daily and has noticed improvement in appearance of the lesions, feels they are more smooth.  He has also been taking hydroxyzine, however only once at night to avoid daytime sleepiness, and notes a decrease in the itching with this. His skin itches "all the time," but states it doesn't really bother him and is able to get along with his day. Rash is not painful. He denies any recent unexpected weight loss, fever, night sweats, or anxiety.    Smoking status reviewed  Review of Systems Per HPI, also denies recent illness, headache, changes in vision, chest pain, shortness of breath, abdominal pain, N/V/D, weakness   Patient Active Problem List   Diagnosis Date Noted  . Anxiety state 03/08/2018  . Paroxysmal atrial fibrillation ([place]) 01/31/2018  . Essential hypertension 01/31/2018  . S/P lobectomy of lung 01/17/2018  . COPD (chronic obstructive pulmonary disease) ([place]) 12/20/2017  . Abnormal CT lung screening 12/20/2017  . Poor dentition 03/19/2017  . Prurigo nodularis 01/16/2017  . Rotator cuff tendinitis, left 12/26/2016  . Tobacco abuse 12/16/2016  . Leg cramps 12/16/2016  . HYPERCHOLESTEROLEMIA 11/14/2006  . IMPOTENCE INORGANIC 11/14/2006  . GASTROESOPHAGEAL REFLUX, NO ESOPHAGITIS 11/14/2006  . BURSITIS, SUBDELTOID 11/14/2006     Objective:  BP 108/64   Pulse 67   Temp 98.2 F (36.8 C) (Oral)   Ht 6' (1.829 m)   Wt 60.9 kg (134 lb 3.2 oz)   SpO2 98%   BMI 18.20 kg/m  Vitals and nursing note reviewed  General: NAD,  pleasant Cardiac: RRR, normal heart sounds, no murmurs Respiratory: CTAB, normal effort Abdomen: soft, nontender, nondistended Extremities: no edema or cyanosis. WWP. Skin: warm and dry, Scattered non-tender hypopigmented minimally elevated papules present on bilateral forearms (R>L) and upper back. Well healed punch biopsy site on L upper back and scars noted on left mid/post-axillary line s/p lung procedure.  Neuro: alert and oriented, no focal deficits Psych: normal affect  Assessment & Plan:    Prurigo nodularis Pt reports improvement in appearance and symptoms. Pt not endorsing any underlying anxiety contributing to eruption.  -Continue topical kenalog BID to affected areas  -Continue Vistaril at night as patient reports taking and notes decreased itching  -Start Montelukast 10 mg daily   -Discussed proper skin hygiene (including continue barrier cream with Aveeno after showers/daily)   Patient also provided samples of Ensure and coupons for more, per request for weight gain.   F/u in 1 month.     Darrelyn Hillock, DO Family Medicine Resident PGY-1

## 2018-04-15 ENCOUNTER — Encounter: Payer: Self-pay | Admitting: Family Medicine

## 2018-04-15 ENCOUNTER — Other Ambulatory Visit: Payer: Self-pay

## 2018-04-15 ENCOUNTER — Ambulatory Visit (INDEPENDENT_AMBULATORY_CARE_PROVIDER_SITE_OTHER): Payer: BLUE CROSS/BLUE SHIELD | Admitting: Family Medicine

## 2018-04-15 VITALS — BP 108/64 | HR 67 | Temp 98.2°F | Ht 72.0 in | Wt 134.2 lb

## 2018-04-15 DIAGNOSIS — L281 Prurigo nodularis: Secondary | ICD-10-CM

## 2018-04-15 MED ORDER — MONTELUKAST SODIUM 10 MG PO TABS: 10.0000 mg | ORAL_TABLET | Freq: Every day | ORAL | 1 refills | Status: DC

## 2018-04-15 NOTE — Patient Instructions (Signed)
It was so nice meeting you today! I am so glad your skin is improving with the steroid cream. I have prescribed montelukast tablets to take in addition to Vistaril to help the itching for you to pick up at your pharmacy. Please call if you have any questions or concerns in the meantime!

## 2018-04-15 NOTE — Assessment & Plan Note (Addendum)
Pt reports improvement in appearance and symptoms. Pt not endorsing any underlying anxiety contributing to eruption.  -Continue topical kenalog BID to affected areas  -Continue Vistaril at night as patient reports taking and notes decreased itching  -Start Montelukast 10 mg daily   -Discussed proper skin hygiene (including continue barrier cream with Aveeno after showers/daily)

## 2018-04-17 ENCOUNTER — Telehealth: Payer: Self-pay | Admitting: Family Medicine

## 2018-04-17 NOTE — Telephone Encounter (Signed)
LVM - Return in about 1 month (around 05/16/2018) for f/u skin

## 2018-05-12 ENCOUNTER — Other Ambulatory Visit: Payer: Self-pay | Admitting: Cardiothoracic Surgery

## 2018-05-12 DIAGNOSIS — Z902 Acquired absence of lung [part of]: Secondary | ICD-10-CM

## 2018-05-14 ENCOUNTER — Encounter: Payer: BLUE CROSS/BLUE SHIELD | Admitting: Cardiothoracic Surgery

## 2018-05-15 ENCOUNTER — Encounter: Payer: BLUE CROSS/BLUE SHIELD | Admitting: Cardiothoracic Surgery

## 2018-05-15 ENCOUNTER — Other Ambulatory Visit: Payer: Self-pay | Admitting: Family Medicine

## 2018-05-15 DIAGNOSIS — L281 Prurigo nodularis: Secondary | ICD-10-CM

## 2018-05-16 ENCOUNTER — Ambulatory Visit
Admission: RE | Admit: 2018-05-16 | Discharge: 2018-05-16 | Disposition: A | Discharge status: Home / Self Care | Payer: BLUE CROSS/BLUE SHIELD | Source: Ambulatory Visit | Attending: Cardiothoracic Surgery | Admitting: Cardiothoracic Surgery

## 2018-05-16 ENCOUNTER — Other Ambulatory Visit: Payer: Self-pay

## 2018-05-16 ENCOUNTER — Ambulatory Visit: Payer: BLUE CROSS/BLUE SHIELD | Admitting: Cardiothoracic Surgery

## 2018-05-16 ENCOUNTER — Encounter: Payer: Self-pay | Admitting: Cardiothoracic Surgery

## 2018-05-16 VITALS — BP 138/88 | HR 68 | Resp 18 | Ht 72.0 in | Wt 135.6 lb

## 2018-05-16 DIAGNOSIS — Z902 Acquired absence of lung [part of]: Secondary | ICD-10-CM

## 2018-05-16 DIAGNOSIS — R0602 Shortness of breath: Secondary | ICD-10-CM | POA: Diagnosis not present

## 2018-05-16 NOTE — Progress Notes (Signed)
PCP is Patriciaann Clan, DO Referring Provider is Patriciaann Clan, DO  Chief Complaint  Patient presents with  . Routine Post Op    f/u with chest xray, s/p VATS/ LLL Wedge/ Lobectomy 01/17/18  . Lung Lesion    HPI: Patient returns for scheduled postop visit 4 months after left lower lobectomy for stage I squamous cell carcinoma.  He continues to do well.  He continues to be's cigarette smoke free.  He is back to work full-time.  He has minimal postthoracotomy pain.  Chest x-ray today shows clear lung fields, resolution of small postop left pleural effusion at the costophrenic angle.  No nodules or masses noted.  The patient will be scheduled for a CT scan 6 months following left lower lobectomy  -November 2019 -for surveillance of recurrent disease.  Past Medical History:  Diagnosis Date  . Bursitis of left shoulder   . GERD (gastroesophageal reflux disease)   . Hyperlipidemia   . Restless leg syndrome   . Squamous cell lung cancer [facility])     Past Surgical History:  Procedure Laterality Date  . CIRCUMCISION  12/13/2003  . COLONOSCOPY    . VIDEO ASSISTED THORACOSCOPY (VATS)/ LOBECTOMY Left 01/17/2018   Procedure: left VIDEO ASSISTED THORACOSCOPY (VATS) with left lower lobe wedge resection and LOBECTOMY;  Surgeon: Ivin Poot, MD;  Location: [facility] OR;  Service: Thoracic;  Laterality: Left;    Family History  Problem Relation Age of Onset  . Esophageal cancer Brother   . Colon cancer Father 58  . Arthritis Unknown     Social History Social History   Tobacco Use  . Smoking status: Former Smoker    Packs/day: 0.10    Years: 42.00    Pack years: 4.20    Types: E-cigarettes, Cigars    Start date: 09/17/1974    Last attempt to quit: 12/22/2017    Years since quitting: 0.3  . Smokeless tobacco: Never Used  . Tobacco comment: Quit one month ago.   Substance Use Topics  . Alcohol use: No    Comment: occ.  . Drug use: Yes    Frequency: 7.0 times per week    Types:  Marijuana    Comment: crack use distant past /weed yesterday-pt will stop until surgery    Current Outpatient Medications  Medication Sig Dispense Refill  . Amino Acids-Protein Hydrolys (FEEDING SUPPLEMENT, PRO-STAT SUGAR FREE 64,) LIQD Take 30 mLs by mouth 2 (two) times daily. 900 mL 0  . atorvastatin (LIPITOR) 40 MG tablet Take 1 tablet (40 mg total) by mouth daily. 90 tablet 3  . betamethasone valerate ointment (VALISONE) 0.1 % Apply 1 application topically 2 (two) times daily as needed (irritation). 15 g 0  . ENSURE (ENSURE) Take 237 mLs by mouth 2 (two) times daily between meals.    . fish oil-omega-3 fatty acids 1000 MG capsule Take 2 g by mouth daily.    [name] gabapentin (NEURONTIN) 600 MG tablet Take 1 tablet (600 mg total) by mouth at bedtime. 90 tablet 2  . hydrocerin (EUCERIN) CREA Apply 1 application topically 2 (two) times daily. 113 g 0  . hydrOXYzine (VISTARIL) 25 MG capsule Take 1 capsule (25 mg total) by mouth 3 (three) times daily as needed. 30 capsule 0  . ibuprofen (ADVIL,MOTRIN) 200 MG tablet Take 400 mg by mouth every 6 (six) hours as needed for mild pain.    . montelukast (SINGULAIR) 10 MG tablet Take 1 tablet (10 mg total) by mouth daily. Need  follow up visit. 30 tablet 1  . Multiple Vitamin (MULTIVITAMIN) tablet Take 1 tablet by mouth daily.    [name] omeprazole (PRILOSEC) 20 MG capsule Take 20 mg by mouth daily as needed.     . triamcinolone ointment (KENALOG) 0.1 % Apply 1 application topically 2 (two) times daily. 453.6 g 0  . umeclidinium bromide (INCRUSE ELLIPTA) 62.5 MCG/INH AEPB Inhale 1 puff into the lungs daily. 1 each 11  . cetirizine (ZYRTEC) 10 MG tablet Take 1 tablet (10 mg total) by mouth as needed for allergies (Take once daily as needed for itching). 30 tablet 1   No current facility-administered medications for this visit.     Allergies  Allergen Reactions  . Lactose Intolerance (Gi) Nausea Only and Other (See Comments)    Unsettled bowel.     Review of  systems  Increased weight and strength Not smoking No shortness of breath Minimal left side postthoracotomy discomfort  BP 138/88 (BP Location: Left Arm, Patient Position: Sitting, Cuff Size: Normal)   Pulse 68   Resp 18   Ht 6' (1.829 m)   Wt 135 lb 9.6 oz (61.5 kg)   SpO2 97% Comment: RA  BMI 18.39 kg/m  Physical Exam       Exam    General- alert and comfortable    Neck- no JVD, no cervical adenopathy palpable, no carotid bruit   Lungs- clear without rales, wheezes   Cor- regular rate and rhythm, no murmur , gallop   Abdomen- soft, non-tender   Extremities - warm, non-tender, minimal edema   Neuro- oriented, appropriate, no focal weakness   Diagnostic Tests:   Chest x-ray images reviewed and are clear Impression: Doing well 4 months status post left lower lobectomy for stage I squamous cell carcinoma  Plan: Surveillance 6 months postop CT scan scheduled for November.   Len Childs, MD [facility] [phone]

## 2018-06-20 ENCOUNTER — Other Ambulatory Visit: Payer: Self-pay | Admitting: Cardiothoracic Surgery

## 2018-06-20 DIAGNOSIS — C3490 Malignant neoplasm of unspecified part of unspecified bronchus or lung: Secondary | ICD-10-CM

## 2018-07-08 ENCOUNTER — Other Ambulatory Visit: Payer: Self-pay | Admitting: Family Medicine

## 2018-07-08 DIAGNOSIS — L281 Prurigo nodularis: Secondary | ICD-10-CM

## 2018-08-13 ENCOUNTER — Ambulatory Visit: Payer: BLUE CROSS/BLUE SHIELD | Admitting: Cardiothoracic Surgery

## 2018-08-13 ENCOUNTER — Other Ambulatory Visit: Payer: BLUE CROSS/BLUE SHIELD

## 2018-09-03 ENCOUNTER — Other Ambulatory Visit: Payer: Self-pay | Admitting: Internal Medicine

## 2018-09-24 ENCOUNTER — Other Ambulatory Visit: Payer: Self-pay

## 2018-09-24 ENCOUNTER — Ambulatory Visit (INDEPENDENT_AMBULATORY_CARE_PROVIDER_SITE_OTHER): Payer: BLUE CROSS/BLUE SHIELD | Admitting: Cardiothoracic Surgery

## 2018-09-24 ENCOUNTER — Encounter: Payer: Self-pay | Admitting: Radiology

## 2018-09-24 ENCOUNTER — Ambulatory Visit
Admission: RE | Admit: 2018-09-24 | Discharge: 2018-09-24 | Disposition: A | Discharge status: Home / Self Care | Payer: BLUE CROSS/BLUE SHIELD | Source: Ambulatory Visit | Attending: Cardiothoracic Surgery | Admitting: Cardiothoracic Surgery

## 2018-09-24 ENCOUNTER — Other Ambulatory Visit: Payer: BLUE CROSS/BLUE SHIELD

## 2018-09-24 VITALS — BP 136/80 | HR 59 | Resp 16 | Ht 72.0 in | Wt 128.4 lb

## 2018-09-24 DIAGNOSIS — C7931 Secondary malignant neoplasm of brain: Secondary | ICD-10-CM

## 2018-09-24 DIAGNOSIS — C3432 Malignant neoplasm of lower lobe, left bronchus or lung: Secondary | ICD-10-CM | POA: Diagnosis not present

## 2018-09-24 DIAGNOSIS — C3490 Malignant neoplasm of unspecified part of unspecified bronchus or lung: Secondary | ICD-10-CM

## 2018-09-24 DIAGNOSIS — J90 Pleural effusion, not elsewhere classified: Secondary | ICD-10-CM | POA: Diagnosis not present

## 2018-09-24 DIAGNOSIS — Z902 Acquired absence of lung [part of]: Secondary | ICD-10-CM | POA: Diagnosis not present

## 2018-09-24 MED ORDER — IOPAMIDOL (ISOVUE-300) INJECTION 61%
75.0000 mL | Freq: Once | INTRAVENOUS | Status: AC | PRN
  Administered 2018-09-24: 75 mL via INTRAVENOUS

## 2018-09-30 ENCOUNTER — Encounter: Payer: Self-pay | Admitting: Cardiothoracic Surgery

## 2018-09-30 NOTE — Progress Notes (Signed)
PCP is Patriciaann Clan, DO Referring Provider is Patriciaann Clan, DO  Chief Complaint  Patient presents with  . Routine Post Op    with a CT CHEST s/p LLLobectomy 5'3'19    HPI: Patient returns for 70-month follow-up after left lower lobectomy for non-small cell carcinoma, stage I.  Patient is done well.  He denies shortness of breath.  He is back at work working for a [employer] a [employer].  No significant pain.  He continues to smoke 1 cigarette every 2 to 3 days  CT scan today shows no evidence of new or recurrent lung cancer.  Very small left pleural effusion noted.   Past Medical History:  Diagnosis Date  . Bursitis of left shoulder   . GERD (gastroesophageal reflux disease)   . Hyperlipidemia   . Restless leg syndrome   . Squamous cell lung cancer [facility])     Past Surgical History:  Procedure Laterality Date  . CIRCUMCISION  12/13/2003  . COLONOSCOPY    . VIDEO ASSISTED THORACOSCOPY (VATS)/ LOBECTOMY Left 01/17/2018   Procedure: left VIDEO ASSISTED THORACOSCOPY (VATS) with left lower lobe wedge resection and LOBECTOMY;  Surgeon: Ivin Poot, MD;  Location: [facility] OR;  Service: Thoracic;  Laterality: Left;    Family History  Problem Relation Age of Onset  . Esophageal cancer Brother   . Colon cancer Father 60  . Arthritis Unknown     Social History Social History   Tobacco Use  . Smoking status: Former Smoker    Packs/day: 0.10    Years: 42.00    Pack years: 4.20    Types: E-cigarettes, Cigars    Start date: 09/17/1974    Last attempt to quit: 12/22/2017    Years since quitting: 0.7  . Smokeless tobacco: Never Used  . Tobacco comment: Quit one month ago.   Substance Use Topics  . Alcohol use: No    Comment: occ.  . Drug use: Yes    Frequency: 7.0 times per week    Types: Marijuana    Comment: crack use distant past /weed yesterday-pt will stop until surgery    Current Outpatient Medications  Medication Sig Dispense Refill  . atorvastatin (LIPITOR) 40 MG  tablet Take 1 tablet (40 mg total) by mouth daily. 90 tablet 3  . betamethasone valerate ointment (VALISONE) 0.1 % Apply 1 application topically 2 (two) times daily as needed.    . ENSURE (ENSURE) Take 237 mLs by mouth 2 (two) times daily between meals.    . fish oil-omega-3 fatty acids 1000 MG capsule Take 2 g by mouth daily.    [name] gabapentin (NEURONTIN) 600 MG tablet TAKE 1 TABLET BY MOUTH AT BEDTIME 90 tablet 2  . ibuprofen (ADVIL,MOTRIN) 200 MG tablet Take 400 mg by mouth every 6 (six) hours as needed for mild pain.    . Multiple Vitamin (MULTIVITAMIN) tablet Take 1 tablet by mouth daily.    [name] omeprazole (PRILOSEC) 20 MG capsule Take 20 mg by mouth daily as needed.      No current facility-administered medications for this visit.     Allergies  Allergen Reactions  . Lactose Intolerance (Gi) Nausea Only and Other (See Comments)    Unsettled bowel.     Review of Systems  Weight stable Good appetite No edema No headache No drainage from incision  BP 136/80 (BP Location: Right Arm, Patient Position: Sitting, Cuff Size: Normal) Comment: MANUALLY  Pulse (!) 59   Resp 16   Ht  6' (1.829 m)   Wt 128 lb 6.4 oz (58.2 kg)   SpO2 98% Comment: ON RA  BMI 17.41 kg/m  Physical Exam Alert and comfortable Lungs clear Heart rate regular Neuro intact  Diagnostic Tests: CT scan images personally reviewed and discussed with patient.  No evidence of recurrent cancer.  Impression: Doing well 6 months after left lower lobectomy for non-small cell carcinoma.  Plan: Complete smoke cessation emphasized to patient. Return for CT scan in 6 months - 1 year postop lobectomy.   Len Childs, MD [facility] [phone]

## 2018-11-20 ENCOUNTER — Other Ambulatory Visit: Payer: Self-pay | Admitting: Internal Medicine

## 2018-11-20 DIAGNOSIS — E785 Hyperlipidemia, unspecified: Secondary | ICD-10-CM

## 2018-11-21 MED ORDER — ATORVASTATIN CALCIUM 40 MG PO TABS: 40.0000 mg | ORAL_TABLET | Freq: Every day | ORAL | 3 refills | Status: DC

## 2018-11-21 NOTE — Addendum Note (Signed)
Addended by: Esau Grew on: 11/21/2018 08:13 AM   Modules accepted: Orders

## 2018-12-19 ENCOUNTER — Other Ambulatory Visit: Payer: Self-pay | Admitting: Internal Medicine

## 2019-01-22 ENCOUNTER — Other Ambulatory Visit: Payer: Self-pay | Admitting: *Deleted

## 2019-01-22 DIAGNOSIS — C3432 Malignant neoplasm of lower lobe, left bronchus or lung: Secondary | ICD-10-CM

## 2019-01-22 NOTE — Progress Notes (Unsigned)
ct 

## 2019-02-16 ENCOUNTER — Other Ambulatory Visit: Payer: Self-pay | Admitting: Internal Medicine

## 2019-02-16 DIAGNOSIS — E785 Hyperlipidemia, unspecified: Secondary | ICD-10-CM

## 2019-03-13 ENCOUNTER — Other Ambulatory Visit: Payer: Self-pay | Admitting: Family Medicine

## 2019-03-13 DIAGNOSIS — L281 Prurigo nodularis: Secondary | ICD-10-CM

## 2019-04-21 ENCOUNTER — Other Ambulatory Visit: Payer: Self-pay | Admitting: Cardiothoracic Surgery

## 2019-04-22 ENCOUNTER — Other Ambulatory Visit: Payer: BLUE CROSS/BLUE SHIELD

## 2019-04-22 ENCOUNTER — Ambulatory Visit: Payer: BLUE CROSS/BLUE SHIELD | Admitting: Cardiothoracic Surgery

## 2019-05-06 ENCOUNTER — Other Ambulatory Visit: Payer: Self-pay

## 2019-05-06 ENCOUNTER — Encounter: Payer: Self-pay | Admitting: Cardiothoracic Surgery

## 2019-05-06 ENCOUNTER — Ambulatory Visit
Admission: RE | Admit: 2019-05-06 | Discharge: 2019-05-06 | Disposition: A | Discharge status: Home / Self Care | Payer: 59 | Source: Ambulatory Visit | Attending: Cardiothoracic Surgery | Admitting: Cardiothoracic Surgery

## 2019-05-06 ENCOUNTER — Ambulatory Visit (INDEPENDENT_AMBULATORY_CARE_PROVIDER_SITE_OTHER): Payer: 59 | Admitting: Cardiothoracic Surgery

## 2019-05-06 VITALS — BP 151/83 | HR 64 | Temp 97.7°F | Resp 16 | Ht 72.0 in | Wt 122.2 lb

## 2019-05-06 DIAGNOSIS — Z85118 Personal history of other malignant neoplasm of bronchus and lung: Secondary | ICD-10-CM

## 2019-05-06 DIAGNOSIS — Z902 Acquired absence of lung [part of]: Secondary | ICD-10-CM

## 2019-05-06 DIAGNOSIS — C3432 Malignant neoplasm of lower lobe, left bronchus or lung: Secondary | ICD-10-CM

## 2019-05-06 MED ORDER — IOPAMIDOL (ISOVUE-300) INJECTION 61%
75.0000 mL | Freq: Once | INTRAVENOUS | Status: AC | PRN
  Administered 2019-05-06: 75 mL via INTRAVENOUS

## 2019-05-06 NOTE — Progress Notes (Signed)
PCP is Patriciaann Clan, DO Referring Provider is Patriciaann Clan, DO  Chief Complaint  Patient presents with  . Lung Cancer    6 month f/u with CTA Chest...s/p LLLobectlomy 01/17/18    HPI: Patient returns with chest CT scan for 1 year follow-up after left lower lobectomy for stage I squamous cell carcinoma lung.  Unfortunately patient still smokes 1 or 2 cigarettes a day.  He denies pulmonary symptoms.  Weight is stable.  CT scan of the chest shows no evidence of recurrence.  He does have some changes of COPD.  There are some very small nonspecific 2 to 3 mm bilateral pulmonary nodules.  No abnormal adenopathy.   Past Medical History:  Diagnosis Date  . Bursitis of left shoulder   . GERD (gastroesophageal reflux disease)   . Hyperlipidemia   . Restless leg syndrome   . Squamous cell lung cancer [facility])     Past Surgical History:  Procedure Laterality Date  . CIRCUMCISION  12/13/2003  . COLONOSCOPY    . VIDEO ASSISTED THORACOSCOPY (VATS)/ LOBECTOMY Left 01/17/2018   Procedure: left VIDEO ASSISTED THORACOSCOPY (VATS) with left lower lobe wedge resection and LOBECTOMY;  Surgeon: Ivin Poot, MD;  Location: [facility] OR;  Service: Thoracic;  Laterality: Left;    Family History  Problem Relation Age of Onset  . Esophageal cancer Brother   . Colon cancer Father 66  . Arthritis Unknown     Social History Social History   Tobacco Use  . Smoking status: Former Smoker    Packs/day: 0.10    Years: 42.00    Pack years: 4.20    Types: E-cigarettes, Cigars    Start date: 09/17/1974    Quit date: 12/22/2017    Years since quitting: 1.3  . Smokeless tobacco: Never Used  . Tobacco comment: Quit one month ago.   Substance Use Topics  . Alcohol use: No    Comment: occ.  . Drug use: Yes    Frequency: 7.0 times per week    Types: Marijuana    Comment: crack use distant past /weed yesterday-pt will stop until surgery    Current Outpatient Medications  Medication Sig Dispense Refill   . atorvastatin (LIPITOR) 40 MG tablet TAKE 1 TABLET BY MOUTH EVERY DAY 90 tablet 3  . betamethasone valerate ointment (VALISONE) 0.1 % Apply 1 application topically 2 (two) times daily as needed.    . ENSURE (ENSURE) Take 237 mLs by mouth 2 (two) times daily between meals.    . fish oil-omega-3 fatty acids 1000 MG capsule Take 2 g by mouth daily.    [name] gabapentin (NEURONTIN) 600 MG tablet TAKE 1 TABLET BY MOUTH AT BEDTIME 90 tablet 2  . ibuprofen (ADVIL,MOTRIN) 200 MG tablet Take 400 mg by mouth every 6 (six) hours as needed for mild pain.    . montelukast (SINGULAIR) 10 MG tablet TAKE 1 TABLET BY MOUTH AT BEDTIME 90 tablet 2  . Multiple Vitamin (MULTIVITAMIN) tablet Take 1 tablet by mouth daily.    [name] omeprazole (PRILOSEC) 20 MG capsule Take 20 mg by mouth daily as needed.      No current facility-administered medications for this visit.     Allergies  Allergen Reactions  . Lactose Intolerance (Gi) Nausea Only and Other (See Comments)    Unsettled bowel.     Review of Systems  Weight stable Works full time  No postthoracotomy pain  BP (!) 151/83 (BP Location: Right Arm, Patient Position: Sitting, Cuff Size:  Normal)   Pulse 64   Temp 97.7 F (36.5 C)   Resp 16   Ht 6' (1.829 m)   Wt 122 lb 3.2 oz (55.4 kg)   SpO2 94% Comment: RA  BMI 16.57 kg/m  Physical Exam      Exam    General- alert and comfortable    Neck- no JVD, no cervical adenopathy palpable, no carotid bruit   Lungs- clear without rales, wheezes   Cor- regular rate and rhythm, no murmur , gallop   Abdomen- soft, non-tender   Extremities - warm, non-tender, minimal edema   Neuro- oriented, appropriate, no focal weakness   Diagnostic Tests: CT scan images personally reviewed showing no evidence recurrence or new suspicious lesions.  Impression: No evidence of recurrent cancer 1 year after left lower lobectomy for stage I squamous cell carcinoma lung  Plan: Recommended for complete smoking cessation.  He  will return for a surveillance CT scan of the chest 2 years postop next summer.   Len Childs, MD [facility] [phone]

## 2019-06-12 ENCOUNTER — Other Ambulatory Visit: Payer: Self-pay | Admitting: Family Medicine

## 2019-08-03 IMAGING — PT NM PET TUM IMG INITIAL (PI) SKULL BASE T - THIGH
8 series · 25 of 25 positions shown · non-contrast
Comparison: Multiple exams, including 12/10/2017

CLINICAL DATA: Initial treatment strategy for pulmonary nodule in
the left lower lobe.

EXAM:
NUCLEAR MEDICINE PET SKULL BASE TO THIGH
TECHNIQUE: 6.3 mCi F-18 FDG was injected intravenously. Full-ring PET imaging
was performed from the skull base to thigh after the radiotracer. CT
data was obtained and used for attenuation correction and anatomic
localization.
Fasting blood glucose: 108 mg/dl

[Series 3: pet sk_thigh ac · axial · 5.0mm · 4.07mm/px · z∈[-948,-72]mm · 5 of 220 slices shown]
[im 1/220]
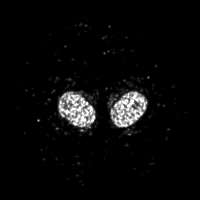
[im 55/220]
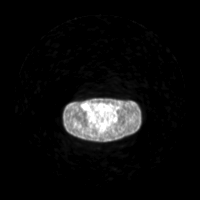
[im 110/220]
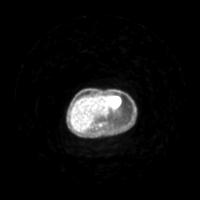
[im 165/220]
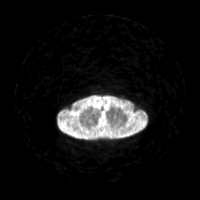
[im 220/220]
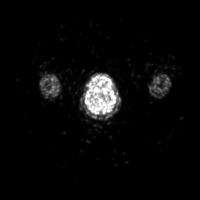

[Series 4: ct sk_thigh 5.0 b31f · axial · 5.0mm · 0.98mm/px · z∈[-948,-72]mm · 5 of 220 slices shown]
[im 1/220]
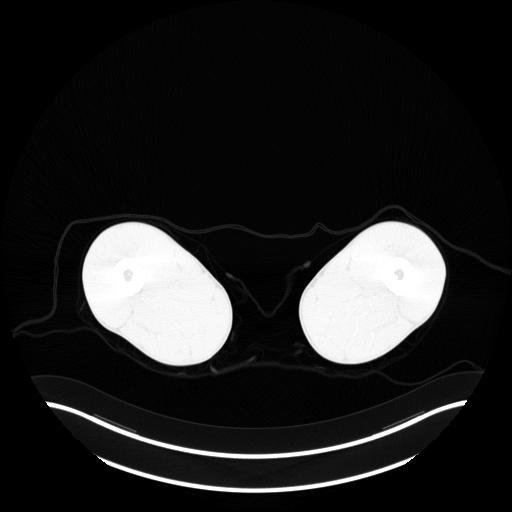
[im 55/220]
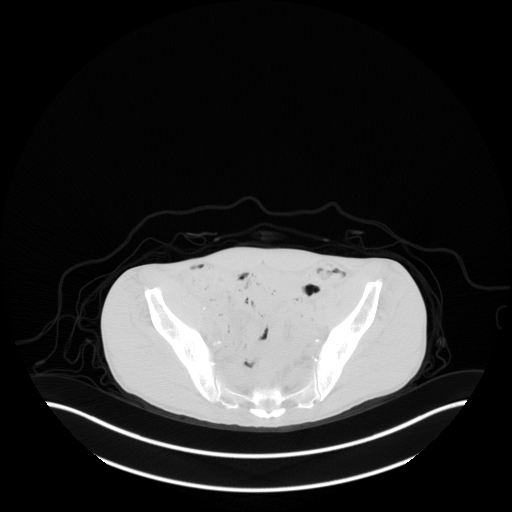
[im 110/220]
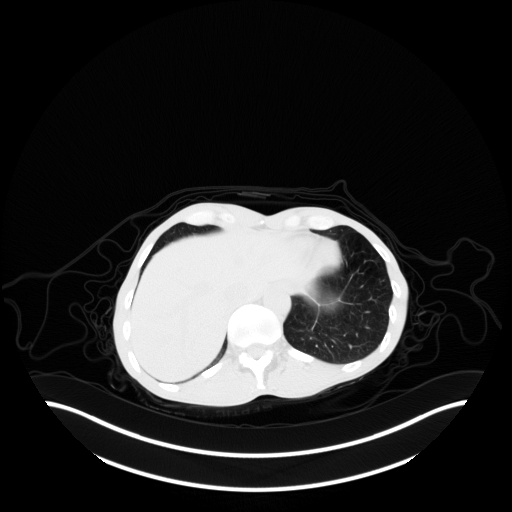
[im 165/220]
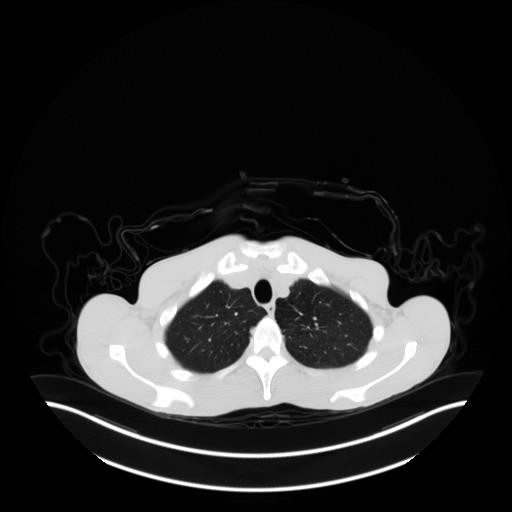
[im 220/220  brain]
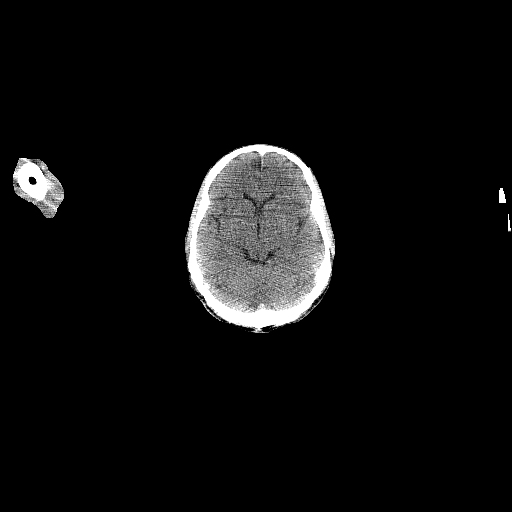

[Series 5: pet sk_thigh nac · axial · 5.0mm · 4.07mm/px · z∈[-948,-72]mm · 5 of 220 slices shown]
[im 1/220]
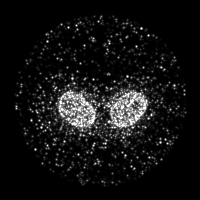
[im 55/220]
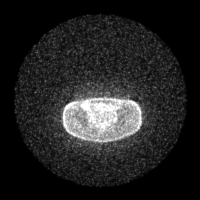
[im 110/220]
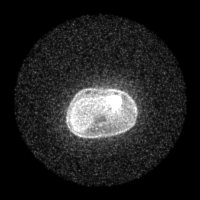
[im 165/220]
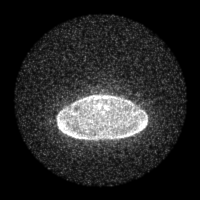
[im 220/220]
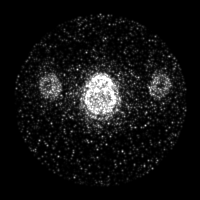

[Series 8: ct sk_thigh 5.0 b70f (id) · axial · 5.0mm · 0.72mm/px · z∈[-582,-222]mm · 2 of 91 slices shown]
[im 1/91  bone]
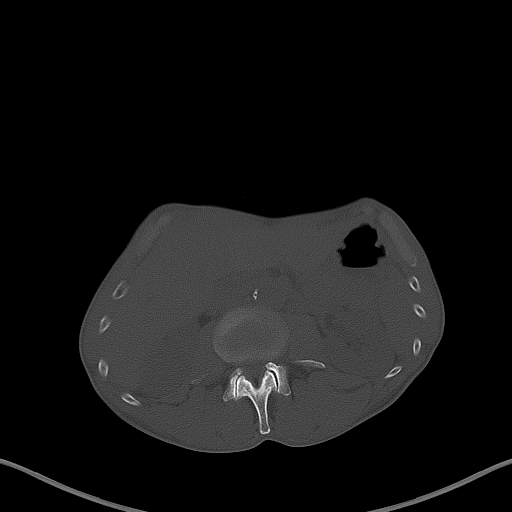
[im 91/91  bone]
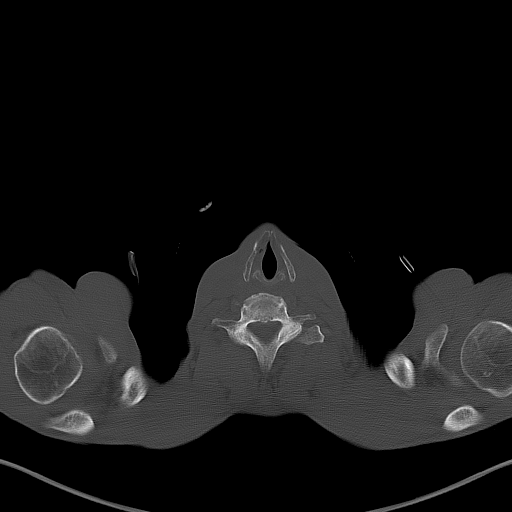

[Series 603: mip range 2 · coronal · 1.82mm/px · 1 of 32 slices shown]
[im 1/32]
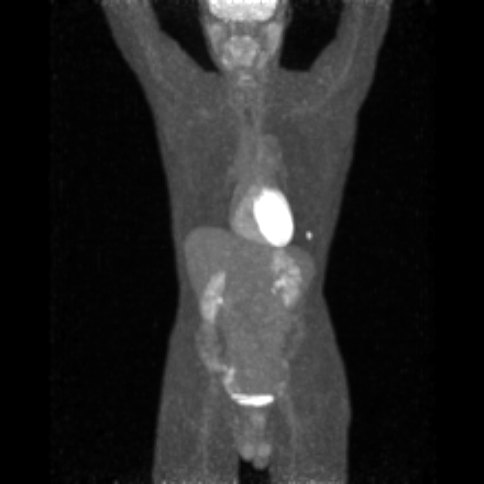

[Series 604: range-ct sk_thigh 5.0 (id)<alpha range> · 1 of 63 slices shown (1 of 2)]
[im 1/63]
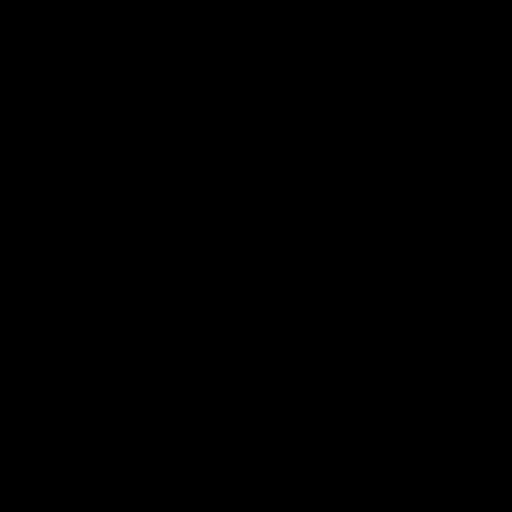

[Series 605: range-ct sk_thigh 5.0 (id)<alpha range> · 5 of 204 slices shown (2 of 2)]
[im 1/204]
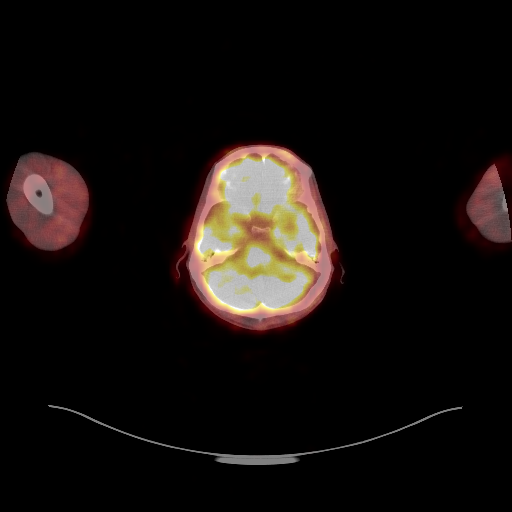
[im 51/204]
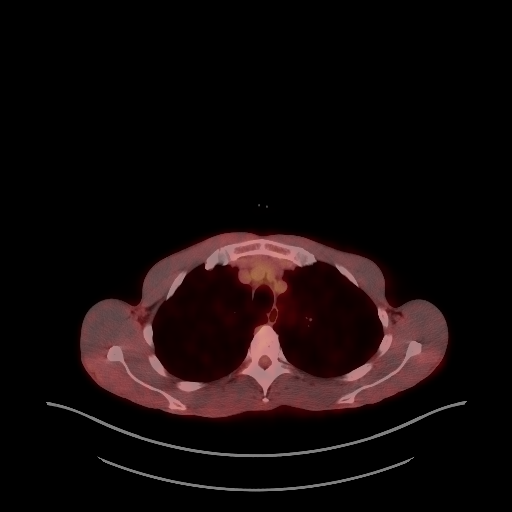
[im 102/204]
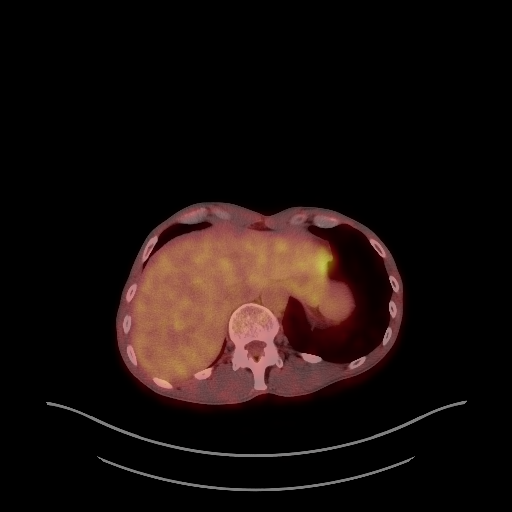
[im 153/204]
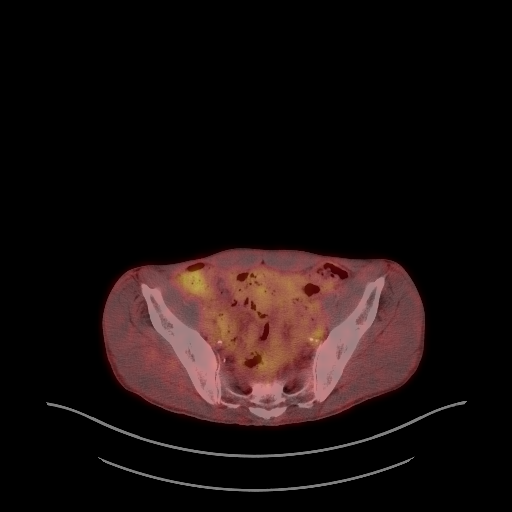
[im 204/204]
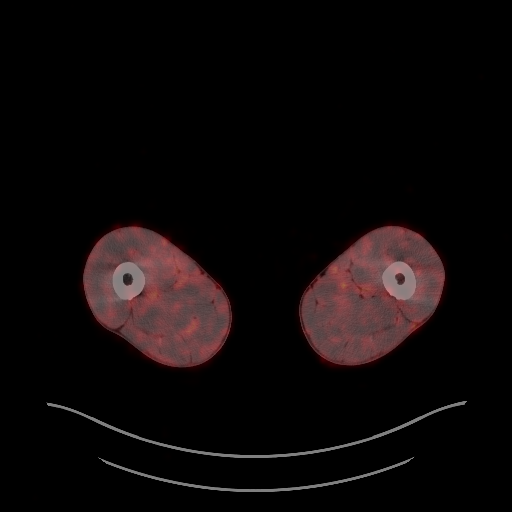

[Series 1076: results mm oncology reading · 5.0mm · 0.60mm/px · 1 of 2 slices shown]
[im 1/2]
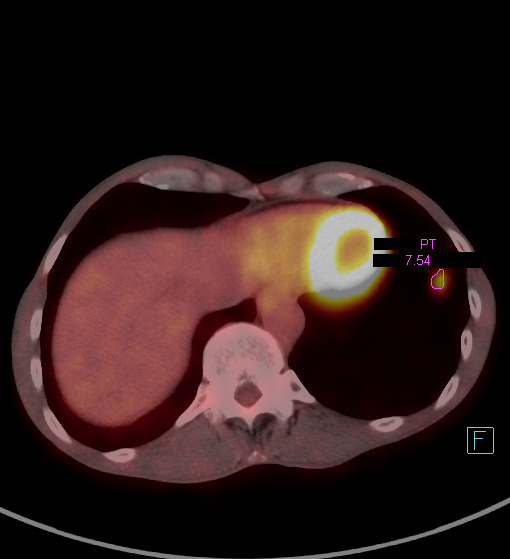

[25 of 25 positions shown; findings below may reference images not displayed]

FINDINGS: Mediastinal blood pool activity: SUV max

NECK: Accentuated dental activity compatible with tooth decay.
Symmetric activity along the palatine tonsils. Minimal symmetric
glottic activity. No worrisome activity for malignancy in the neck.

Incidental CT findings: Mild carotid atherosclerotic calcification.

CHEST: The left lower lobe pulmonary nodule measures 1.3 by 0.9 cm
on image 70/8 and has a maximum SUV of 7.5. No appreciable
hypermetabolic adenopathy in the chest. No other hypermetabolic
lesions observed.

Incidental CT findings: Atherosclerotic calcification of the aorta
and branch vessels. Biapical pleuroparenchymal scarring. Suspected
centrilobular emphysema.

ABDOMEN/PELVIS: No significant abnormal accentuated metabolic
activity in the abdomen/pelvis.

Incidental CT findings: 7 mm faint density in the left mid kidney,
possibly a complex cyst but technically nonspecific.

Aortoiliac atherosclerotic vascular disease.

SKELETON: No significant abnormal skeletal activity.

Incidental CT findings: Degenerative disc disease at L5-S1.
IMPRESSION: 1. Hypermetabolic 1.3 by 0.7 cm left lower lobe pulmonary nodule,
maximum SUV 7.5, compatible with malignancy. No findings of nodal or
metastatic spread.
2. Accentuated dental activity compatible with tooth decay.
3. Other imaging findings of potential clinical significance: Aortic
Atherosclerosis (XM3C1-BRS.S). Small hyperdensity in the left mid
kidney, probably a complex cyst but technically nonspecific.

## 2020-02-13 ENCOUNTER — Other Ambulatory Visit: Payer: Self-pay | Admitting: Family Medicine

## 2020-02-21 ENCOUNTER — Ambulatory Visit
Admission: EM | Admit: 2020-02-21 | Discharge: 2020-02-21 | Disposition: A | Discharge status: Home / Self Care | Payer: 59 | Attending: Emergency Medicine | Admitting: Emergency Medicine

## 2020-02-21 ENCOUNTER — Encounter: Payer: Self-pay | Admitting: *Deleted

## 2020-02-21 ENCOUNTER — Other Ambulatory Visit: Payer: Self-pay

## 2020-02-21 DIAGNOSIS — K529 Noninfective gastroenteritis and colitis, unspecified: Secondary | ICD-10-CM

## 2020-02-21 MED ORDER — ONDANSETRON 4 MG PO TBDP
4.0000 mg | ORAL_TABLET | Freq: Three times a day (TID) | ORAL | 0 refills | Status: DC | PRN
Start: 2020-02-21 — End: 2020-03-22

## 2020-02-21 NOTE — ED Provider Notes (Signed)
[facility]    CSN: [id number] Arrival date & time: 02/21/20  1244      History   Chief Complaint Chief Complaint  Patient presents with  . Diarrhea    HPI Curtis Henry is a 64 y.o. male with history of paroxysmal A. fib, COPD GERD, hyperlipidemia, RLS, SCC, lung cancer s/p left lower lobectomy presenting for diarrhea x2 days.  Does endorse nausea: No vomiting.  Does have generalized mild abdominal discomfort.  Denies blood or melena in stool.  Has been drinking Pedialyte and coconut water, though feels this aggravates diarrhea.  Having small BMs daily.  Denies profuse, malodorous diarrhea, steatorrhea.  No change in urination, arthralgias, myalgias, fever, chest pain, palpitations, difficulty breathing.  No cough, known sick contacts.  Patient did test positive for Covid about 3 months ago: States he is received both Covid vaccines since then, last was 12/18/2019.  Denies recent change in medications.  Has oncologic follow-up next month.  Weight stable.   Past Medical History:  Diagnosis Date  . Bursitis of left shoulder   . GERD (gastroesophageal reflux disease)   . Hyperlipidemia   . Restless leg syndrome   . Squamous cell lung cancer [facility] [place] [facility])     Patient Active Problem List   Diagnosis Date Noted  . Anxiety state 03/08/2018  . Paroxysmal atrial fibrillation ([place]) 01/31/2018  . Essential hypertension 01/31/2018  . S/P lobectomy of lung 01/17/2018  . COPD (chronic obstructive pulmonary disease) ([place]) 12/20/2017  . Abnormal CT lung screening 12/20/2017  . Poor dentition 03/19/2017  . Prurigo nodularis 01/16/2017  . Rotator cuff tendinitis, left 12/26/2016  . Tobacco abuse 12/16/2016  . Leg cramps 12/16/2016  . HYPERCHOLESTEROLEMIA 11/14/2006  . IMPOTENCE INORGANIC 11/14/2006  . GASTROESOPHAGEAL REFLUX, NO ESOPHAGITIS 11/14/2006    Past Surgical History:  Procedure Laterality Date  . CIRCUMCISION  12/13/2003  . COLONOSCOPY    . VIDEO ASSISTED THORACOSCOPY  (VATS)/ LOBECTOMY Left 01/17/2018   Procedure: left VIDEO ASSISTED THORACOSCOPY (VATS) with left lower lobe wedge resection and LOBECTOMY;  Surgeon: Ivin Poot, MD;  Location: [facility] OR;  Service: Thoracic;  Laterality: Left;       Home Medications    Prior to Admission medications   Medication Sig Start Date End Date Taking? Authorizing Provider  atorvastatin (LIPITOR) 40 MG tablet TAKE 1 TABLET BY MOUTH EVERY DAY 02/17/19  Yes Beard, Samantha N, DO  betamethasone valerate ointment (VALISONE) 0.1 % Apply 1 application topically 2 (two) times daily as needed.   Yes [provider]  fish oil-omega-3 fatty acids 1000 MG capsule Take 2 g by mouth daily.   Yes [provider]  gabapentin (NEURONTIN) 600 MG tablet TAKE 1 TABLET BY MOUTH AT BEDTIME 02/16/20  Yes Darrelyn Hillock N, DO  Multiple Vitamin (MULTIVITAMIN) tablet Take 1 tablet by mouth daily.   Yes [provider]  ENSURE (ENSURE) Take 237 mLs by mouth 2 (two) times daily between meals.    [provider]  ibuprofen (ADVIL,MOTRIN) 200 MG tablet Take 400 mg by mouth every 6 (six) hours as needed for mild pain.    [provider]  montelukast (SINGULAIR) 10 MG tablet TAKE 1 TABLET BY MOUTH AT BEDTIME 03/13/19   Darrelyn Hillock N, DO  omeprazole (PRILOSEC) 20 MG capsule Take 20 mg by mouth daily as needed.     [provider]  ondansetron (ZOFRAN ODT) 4 MG disintegrating tablet Take 1 tablet (4 mg total) by mouth every 8 (eight) hours as  needed for nausea or vomiting. 02/21/20   Hall-Potvin, [place], PA-C    Family History Family History  Problem Relation Age of Onset  . Esophageal cancer Brother   . Colon cancer Father 22  . Arthritis Other     Social History Social History   Tobacco Use  . Smoking status: Former Smoker    Packs/day: 0.10    Years: 42.00    Pack years: 4.20    Types: E-cigarettes, Cigars    Start date: 09/17/1974    Quit date: 12/22/2017    Years since quitting:  2.1  . Smokeless tobacco: Never Used  . Tobacco comment: Quit one month ago.   Substance Use Topics  . Alcohol use: Not Currently  . Drug use: Yes    Frequency: 7.0 times per week    Types: Marijuana    Comment: smokes marijuana; crack use distant past >30 yrs ago     Allergies   Lactose intolerance (gi)   Review of Systems As per HPI   Physical Exam Triage Vital Signs ED Triage Vitals  Enc Vitals Group     BP      Pulse      Resp      Temp      Temp src      SpO2      Weight      Height      Head Circumference      Peak Flow      Pain Score      Pain Loc      Pain Edu?      Excl. in [place]?    No data found.  Updated Vital Signs BP (!) 155/91   Pulse 78 Comment: HR regular with palpation  Temp 98.3 F (36.8 C) (Oral)   SpO2 98%   Visual Acuity Right Eye Distance:   Left Eye Distance:   Bilateral Distance:    Right Eye Near:   Left Eye Near:    Bilateral Near:     Physical Exam Constitutional:      General: He is not in acute distress.    Appearance: He is ill-appearing. He is not toxic-appearing or diaphoretic.     Comments: Thin, appears chronically ill.  HENT:     Head: Normocephalic and atraumatic.     Right Ear: Tympanic membrane, ear canal and external ear normal.     Left Ear: Tympanic membrane, ear canal and external ear normal.     Nose: Nose normal.     Mouth/Throat:     Pharynx: Oropharynx is clear. No oropharyngeal exudate or posterior oropharyngeal erythema.  Eyes:     General: No scleral icterus.    Conjunctiva/sclera: Conjunctivae normal.     Pupils: Pupils are equal, round, and reactive to light.  Cardiovascular:     Rate and Rhythm: Normal rate and regular rhythm.  Pulmonary:     Effort: Pulmonary effort is normal. No respiratory distress.     Breath sounds: No wheezing or rales.  Abdominal:     General: Abdomen is flat. Bowel sounds are normal. There is no distension.     Tenderness: There is no guarding.     Comments:  Mild LUQ pain: Patient states this is chronic/stable since left lower lobectomy  Musculoskeletal:     Cervical back: Neck supple. No tenderness.     Right lower leg: No edema.     Left lower leg: No edema.  Lymphadenopathy:     Cervical: No  cervical adenopathy.  Skin:    Capillary Refill: Capillary refill takes less than 2 seconds.     Coloration: Skin is not jaundiced or pale.  Neurological:     General: No focal deficit present.     Mental Status: He is alert and oriented to person, place, and time.      UC Treatments / Results  Labs (all labs ordered are listed, but only abnormal results are displayed) Labs Reviewed  NOVEL CORONAVIRUS, NAA    EKG   Radiology No results found.  Procedures Procedures (including critical care time)  Medications Ordered in UC Medications - No data to display  Initial Impression / Assessment and Plan / UC Course  I have reviewed the triage vital signs and the nursing notes.  Pertinent labs & imaging results that were available during my care of the patient were reviewed by me and considered in my medical decision making (see chart for details).     Patient afebrile, nontoxic, and hemodynamically stable in office today.  Patient is in sinus rhythm.  EKG done office, reviewed by me and compared to previous from 02/23/2018: Sinus rhythm with PAC, possible LAE.  Ventricular rate 71 bpm.  No QTC prolongation, ST elevation or depression.  Waveforms stable in all leads: Nonacute EKG.  Reviewed findings with patient verbalized understanding.  We will continue to push fluids, trial Zofran for nausea.  Discussed low threshold to go to ER for further evaluation given patient's medical history and comorbidities.  Return precautions discussed, patient verbalized understanding and is agreeable to plan. Final Clinical Impressions(s) / UC Diagnoses   Final diagnoses:  Noninfectious gastroenteritis, unspecified type     Discharge Instructions     Your  COVID test is pending - it is important to quarantine / isolate at home until your results are back. If you test positive and would like further evaluation for persistent or worsening symptoms, you may schedule an E-visit or virtual (video) visit throughout the [facility] app or website.  PLEASE NOTE: If you develop severe chest pain or shortness of breath please go to the ER or call [phone] for further evaluation --> DO NOT schedule electronic or virtual visits for this. Please call our office for further guidance / recommendations as needed.  For information about the Covid vaccine, please visit [email or web address]    ED Prescriptions    Medication Sig Dispense Auth. Provider   ondansetron (ZOFRAN ODT) 4 MG disintegrating tablet Take 1 tablet (4 mg total) by mouth every 8 (eight) hours as needed for nausea or vomiting. 21 tablet Hall-Potvin, [place], PA-C     PDMP not reviewed this encounter.   Hall-Potvin, [place], [place] 02/21/20 1348

## 2020-02-21 NOTE — ED Triage Notes (Signed)
C/O diarrhea onset 2 days ago; has been drinking Pedialyte and coconut water, but states PO fluids cause diarrhea.  C/O having some lightheadness.  Denies blood in stool. C/O intermittent abd cramping.  C/O nausea, but denies vomiting.  Denies fevers.  Pt reports having + Covid approx 3 months ago; states has had 2 doses of Covid vaccine - last on 12/18/19.

## 2020-02-21 NOTE — Discharge Instructions (Signed)
Your COVID test is pending - it is important to quarantine / isolate at home until your results are back. °If you test positive and would like further evaluation for persistent or worsening symptoms, you may schedule an E-visit or virtual (video) visit throughout the [facility] MyChart app or website. ° °PLEASE NOTE: If you develop severe chest pain or shortness of breath please go to the ER or call [phone] for further evaluation --> DO NOT schedule electronic or virtual visits for this. °Please call our office for further guidance / recommendations as needed. ° °For information about the Covid vaccine, please visit [email or web address] °

## 2020-02-22 LAB — NOVEL CORONAVIRUS, NAA: SARS-CoV-2, NAA: NOT DETECTED

## 2020-02-22 LAB — SARS-COV-2, NAA 2 DAY TAT

## 2020-02-28 ENCOUNTER — Emergency Department (HOSPITAL_COMMUNITY)
Admission: EM | Admit: 2020-02-28 | Discharge: 2020-02-28 | Disposition: A | Discharge status: Home / Self Care | Payer: 59 | Attending: Emergency Medicine | Admitting: Emergency Medicine

## 2020-02-28 ENCOUNTER — Emergency Department (HOSPITAL_COMMUNITY): Payer: 59

## 2020-02-28 ENCOUNTER — Other Ambulatory Visit: Payer: Self-pay

## 2020-02-28 ENCOUNTER — Encounter (HOSPITAL_COMMUNITY): Payer: Self-pay | Admitting: Emergency Medicine

## 2020-02-28 DIAGNOSIS — M79602 Pain in left arm: Secondary | ICD-10-CM | POA: Diagnosis not present

## 2020-02-28 DIAGNOSIS — F17291 Nicotine dependence, other tobacco product, in remission: Secondary | ICD-10-CM | POA: Insufficient documentation

## 2020-02-28 DIAGNOSIS — Z85118 Personal history of other malignant neoplasm of bronchus and lung: Secondary | ICD-10-CM | POA: Diagnosis not present

## 2020-02-28 DIAGNOSIS — M5412 Radiculopathy, cervical region: Secondary | ICD-10-CM | POA: Diagnosis not present

## 2020-02-28 DIAGNOSIS — M542 Cervicalgia: Secondary | ICD-10-CM | POA: Diagnosis present

## 2020-02-28 LAB — I-STAT CHEM 8, ED
BUN: 6 mg/dL — ABNORMAL LOW (ref 8–23)
Calcium, Ion: 1.23 mmol/L (ref 1.15–1.40)
Chloride: 98 mmol/L (ref 98–111)
Creatinine, Ser: 0.9 mg/dL (ref 0.61–1.24)
Glucose, Bld: 94 mg/dL (ref 70–99)
HCT: 47 % (ref 39.0–52.0)
Hemoglobin: 16 g/dL (ref 13.0–17.0)
Potassium: 4.2 mmol/L (ref 3.5–5.1)
Sodium: 140 mmol/L (ref 135–145)
TCO2: 28 mmol/L (ref 22–32)

## 2020-02-28 MED ORDER — PREDNISONE 10 MG PO TABS
20.0000 mg | ORAL_TABLET | Freq: Every day | ORAL | 0 refills | Status: DC
Start: 2020-02-28 — End: 2020-02-28

## 2020-02-28 MED ORDER — PREDNISONE 10 MG PO TABS
20.0000 mg | ORAL_TABLET | Freq: Every day | ORAL | 0 refills | Status: DC
Start: 2020-02-28 — End: 2020-03-22

## 2020-02-28 NOTE — ED Triage Notes (Signed)
Reports "pins and needles" in L neck that radiates down L arm x 1 week.  States he also has pain in L leg sometimes but not currently.

## 2020-02-28 NOTE — Discharge Instructions (Signed)
Please take medicine as prescribed.  Call neurosurgery for follow-up if pain continues.

## 2020-02-28 NOTE — ED Provider Notes (Signed)
[facility] EMERGENCY DEPARTMENT Provider Note   CSN: [id number] Arrival date & time: 02/28/20  1124     History Chief Complaint  Patient presents with  . Arm Pain  . Neck Pain    Curtis Henry is a 64 y.o. male.  HPI     64 year old male history of squamous cell lung cancer presents today complaining of left-sided neck pain and left arm pain.  This began several days ago.  He had no known trauma.  He denies any weakness but states that his hand has become numb sometimes.  He has no history of neck problems in the past.  He denies any fever, chills, chest pain or dyspnea.  He has had recently had some diarrhea.  He is followed at the family practice center.  Pain is moderate.  It is not changed by movement of the shoulder or arm.  He does have increased pain with certain neck positions.  Past Medical History:  Diagnosis Date  . Bursitis of left shoulder   . GERD (gastroesophageal reflux disease)   . Hyperlipidemia   . Restless leg syndrome   . Squamous cell lung cancer [facility])     Patient Active Problem List   Diagnosis Date Noted  . Anxiety state 03/08/2018  . Paroxysmal atrial fibrillation ([place]) 01/31/2018  . Essential hypertension 01/31/2018  . S/P lobectomy of lung 01/17/2018  . COPD (chronic obstructive pulmonary disease) ([place]) 12/20/2017  . Abnormal CT lung screening 12/20/2017  . Poor dentition 03/19/2017  . Prurigo nodularis 01/16/2017  . Rotator cuff tendinitis, left 12/26/2016  . Tobacco abuse 12/16/2016  . Leg cramps 12/16/2016  . HYPERCHOLESTEROLEMIA 11/14/2006  . IMPOTENCE INORGANIC 11/14/2006  . GASTROESOPHAGEAL REFLUX, NO ESOPHAGITIS 11/14/2006    Past Surgical History:  Procedure Laterality Date  . CIRCUMCISION  12/13/2003  . COLONOSCOPY    . VIDEO ASSISTED THORACOSCOPY (VATS)/ LOBECTOMY Left 01/17/2018   Procedure: left VIDEO ASSISTED THORACOSCOPY (VATS) with left lower lobe wedge resection and LOBECTOMY;  Surgeon: Ivin Poot,  MD;  [facility]: [facility];  Service: Thoracic;  Laterality: Left;       Family History  Problem Relation Age of Onset  . Esophageal cancer Brother   . Colon cancer Father 39  . Arthritis Other     Social History   Tobacco Use  . Smoking status: Former Smoker    Packs/day: 0.10    Years: 42.00    Pack years: 4.20    Types: E-cigarettes, Cigars    Start date: 09/17/1974    Quit date: 12/22/2017    Years since quitting: 2.1  . Smokeless tobacco: Never Used  . Tobacco comment: Quit one month ago.   Vaping Use  . Vaping Use: Some days  Substance Use Topics  . Alcohol use: Not Currently  . Drug use: Yes    Frequency: 7.0 times per week    Types: Marijuana    Comment: smokes marijuana; crack use distant past >30 yrs ago    Home Medications Prior to Admission medications   Medication Sig Start Date End Date Taking? Authorizing Provider  atorvastatin (LIPITOR) 40 MG tablet TAKE 1 TABLET BY MOUTH EVERY DAY 02/17/19   Patriciaann Clan, DO  betamethasone valerate ointment (VALISONE) 0.1 % Apply 1 application topically 2 (two) times daily as needed.    [provider]  ENSURE (ENSURE) Take 237 mLs by mouth 2 (two) times daily between meals.    [provider]  fish oil-omega-3 fatty acids  1000 MG capsule Take 2 g by mouth daily.    [provider]  gabapentin (NEURONTIN) 600 MG tablet TAKE 1 TABLET BY MOUTH AT BEDTIME 02/16/20   Patriciaann Clan, DO  ibuprofen (ADVIL,MOTRIN) 200 MG tablet Take 400 mg by mouth every 6 (six) hours as needed for mild pain.    [provider]  montelukast (SINGULAIR) 10 MG tablet TAKE 1 TABLET BY MOUTH AT BEDTIME 03/13/19   Patriciaann Clan, DO  Multiple Vitamin (MULTIVITAMIN) tablet Take 1 tablet by mouth daily.    [provider]  omeprazole (PRILOSEC) 20 MG capsule Take 20 mg by mouth daily as needed.     [provider]  ondansetron (ZOFRAN ODT) 4 MG disintegrating tablet Take 1 tablet (4 mg total) by mouth  every 8 (eight) hours as needed for nausea or vomiting. 02/21/20   Hall-Potvin, [place], PA-C    Allergies    Lactose intolerance (gi)  Review of Systems   Review of Systems  All other systems reviewed and are negative.   Physical Exam Updated Vital Signs BP 138/87 (BP Location: Right Arm)   Pulse 64   Temp 98.2 F (36.8 C) (Oral)   Resp 18   Ht 1.829 m (6')   Wt 54.4 kg   SpO2 99%   BMI 16.27 kg/m   Physical Exam Vitals and nursing note reviewed.  Constitutional:      Appearance: Normal appearance.  HENT:     Head: Normocephalic.     Right Ear: External ear normal.     Left Ear: External ear normal.     Nose: Nose normal.     Mouth/Throat:     Mouth: Mucous membranes are moist.  Eyes:     Pupils: Pupils are equal, round, and reactive to light.  Neck:     Comments: Pain increases with neck extension Cardiovascular:     Rate and Rhythm: Normal rate and regular rhythm.     Pulses: Normal pulses.  Pulmonary:     Effort: Pulmonary effort is normal.  Abdominal:     General: Abdomen is flat.     Palpations: Abdomen is soft.  Musculoskeletal:        General: No swelling, tenderness, deformity or signs of injury. Normal range of motion.     Cervical back: Normal range of motion and neck supple.     Comments: Radial pulses are intact  Skin:    General: Skin is warm and dry.     Capillary Refill: Capillary refill takes less than 2 seconds.  Neurological:     General: No focal deficit present.     Mental Status: He is alert. Mental status is at baseline.     Cranial Nerves: No cranial nerve deficit.     Sensory: No sensory deficit.     Motor: No weakness.     Coordination: Coordination normal.     Gait: Gait normal.  Psychiatric:        Mood and Affect: Mood normal.     ED Results / Procedures / Treatments   Labs (all labs ordered are listed, but only abnormal results are displayed) Labs Reviewed  I-STAT CHEM 8, ED - Abnormal; Notable for the following  components:      Result Value   BUN 6 (*)    All other components within normal limits    EKG None  Radiology CT Cervical Spine Wo Contrast  Result Date: 02/28/2020 CLINICAL DATA:  Chronic cervicalgia with left-sided radicular  symptoms EXAM: CT CERVICAL SPINE WITHOUT CONTRAST TECHNIQUE: Multidetector CT imaging of the cervical spine was performed without intravenous contrast. Multiplanar CT image reconstructions were also generated. COMPARISON:  None. FINDINGS: Alignment: There is no evidence spondylolisthesis. Skull base and vertebrae: The skull base and craniocervical junction regions are normal. There is no evident fracture. No blastic or lytic bone lesions evident. Soft tissues and spinal canal: Prevertebral soft tissues and predental space regions are normal. No cord or canal hematoma evident. No paraspinous lesions. Disc levels: There is moderate disc space narrowing at C6-7 with slightly milder disc space narrowing at C5-6. At C2-3, there is slight facet hypertrophy bilaterally. There is no nerve root edema or effacement. No disc extrusion or stenosis. At C3-4, there is mild facet hypertrophy bilaterally. There is no nerve root edema or effacement. No disc extrusion or stenosis. At C4-5, there is mild facet hypertrophy bilaterally. There is no nerve root edema or effacement. No disc extrusion or stenosis. At C5-6, there is moderate facet hypertrophy bilaterally. There is mild exit foraminal narrowing on the left at C5-6 due to bony hypertrophy. No disc extrusion or stenosis. There does not appear to be significant nerve root edema or effacement at this level. At C6-7, there is moderate facet hypertrophy bilaterally. There is impression on each exiting nerve root at this level due to bony hypertrophy. There is no disc extrusion or stenosis. At C7-T1, there is slight facet hypertrophy bilaterally. No nerve root edema or effacement. No disc extrusion or stenosis. Upper chest: There is scarring in  each lung apex. Visualized upper lung regions otherwise are clear. Other: There is mild calcification in each carotid artery. IMPRESSION: 1. Osteoarthritic change at several levels. Note that there is exit foraminal narrowing at C6-7 causing impression on each exiting nerve root at this level. A lesser degree of exit foraminal narrowing is noted on the left at C5-6. No disc extrusion or stenosis. 2.  No fracture or spondylolisthesis. 3.  Foci of carotid artery calcification noted on each side. Electronically Signed   By: Lowella Grip III M.D.   On: 02/28/2020 13:18    Procedures Procedures (including critical care time)  Medications Ordered in ED Medications - No data to display  ED Course  I have reviewed the triage vital signs and the nursing notes.  Pertinent labs & imaging results that were available during my care of the patient were reviewed by me and considered in my medical decision making (see chart for details).    MDM Rules/Calculators/A&P                         Patient presents today with neck pain radiating down left arm.  CT obtained.  There are also arthritic changes and foraminal narrowing which could be leading to his symptoms.  However no obvious disc compression noted.  Plan steroids and referral to neurosurgery.  Final Clinical Impression(s) / ED Diagnoses Final diagnoses:  Neck pain  Cervical radiculopathy    Rx / DC Orders ED Discharge Orders    None       Pattricia Boss, MD 02/28/20 [phone]

## 2020-03-03 ENCOUNTER — Other Ambulatory Visit: Payer: Self-pay | Admitting: Cardiothoracic Surgery

## 2020-03-03 DIAGNOSIS — Z902 Acquired absence of lung [part of]: Secondary | ICD-10-CM

## 2020-03-22 ENCOUNTER — Other Ambulatory Visit: Payer: Self-pay

## 2020-03-22 ENCOUNTER — Encounter: Payer: Self-pay | Admitting: Family Medicine

## 2020-03-22 ENCOUNTER — Ambulatory Visit: Payer: 59 | Admitting: Family Medicine

## 2020-03-22 VITALS — BP 136/70 | HR 67 | Ht 72.0 in | Wt 128.4 lb

## 2020-03-22 DIAGNOSIS — I10 Essential (primary) hypertension: Secondary | ICD-10-CM

## 2020-03-22 DIAGNOSIS — Z720 Tobacco use: Secondary | ICD-10-CM

## 2020-03-22 DIAGNOSIS — Z0000 Encounter for general adult medical examination without abnormal findings: Secondary | ICD-10-CM | POA: Diagnosis not present

## 2020-03-22 DIAGNOSIS — T7840XS Allergy, unspecified, sequela: Secondary | ICD-10-CM

## 2020-03-22 DIAGNOSIS — E785 Hyperlipidemia, unspecified: Secondary | ICD-10-CM

## 2020-03-22 DIAGNOSIS — J302 Other seasonal allergic rhinitis: Secondary | ICD-10-CM

## 2020-03-22 DIAGNOSIS — Z902 Acquired absence of lung [part of]: Secondary | ICD-10-CM | POA: Diagnosis not present

## 2020-03-22 MED ORDER — ATORVASTATIN CALCIUM 40 MG PO TABS: 40.0000 mg | ORAL_TABLET | Freq: Every day | ORAL | 3 refills | Status: DC

## 2020-03-22 MED ORDER — LORATADINE 10 MG PO TABS: 10.0000 mg | ORAL_TABLET | Freq: Every day | ORAL | 3 refills | Status: DC

## 2020-03-22 NOTE — Assessment & Plan Note (Signed)
Follow-up with cardiothoracic surgery with repeat CT at the end of this month.

## 2020-03-22 NOTE — Assessment & Plan Note (Addendum)
Reviewed past medical, surgical, and social history.  Medications updated in epic.  Health maintenance reviewed and up-to-date. Encouraged working towards a well-balanced diet and staying physically active.  Refilled Lipitor for primary prevention.

## 2020-03-22 NOTE — Assessment & Plan Note (Signed)
Resolved.  Quit approximately 10 months ago, congratulated him on his efforts.

## 2020-03-22 NOTE — Assessment & Plan Note (Signed)
Rx'd Claritin.

## 2020-03-22 NOTE — Progress Notes (Signed)
° °  Subjective:  CC -- Annual Physical; With complaints of none  Pt reports he is doing well.  Lives with his daughter and grandson.  Quit cigarettes about 9-10 months ago. He is excited as is he continuing to put on desired weight.  Got his [place] Covid vaccines in [place].    Cardiovascular: - Dx Hypertension: no  - Dx Hyperlipidemia: no  - Dx Obesity: no  - Physical Activity: yes  - Diabetes: no   Cancer: Colorectal >> Colonoscopy: Next due on 06/2026 per GI. Lung >> he is s/p left lower lung lobectomy on 01/2018 for stage I squamous cell lung carcinoma.  He follows with cardiothoracic surgery.  He will return for CT chest surveillance later this month.  Still a current smoker. Prostate >> Interested in DRE and/or PSA: no, he is not interested in this today.  Risk factors including African-American, otherwise has no urinary concerns and no family/personal history of prostate cancer. Skin >> Suspicious lesions: no   Social: Alcohol Use: no  Tobacco Use: yes, quit last year.  Other Drugs: yes, THC Interested in STD screening: no  Depression: no   - PHQ2 score: 0 Support and Life at Home: yes   Other: Osteoporosis: no Zoster Vaccine: Already vaccinated Flu Vaccine: no  Pneumonia Vaccine: already obtained   Past Medical History & Medications- reviewed and updated  Objective: BP 136/70    Pulse 67    Ht 6' (1.829 m)    Wt 128 lb 6.4 oz (58.2 kg)    SpO2 98%    BMI 17.41 kg/m  Gen: NAD, alert, cooperative with exam HEENT: NCAT, EOMI, PERRL CV: RRR, good S1/S2 Resp: CTABL, no wheezes, non-labored Abd: Soft, Non Tender, Non Distended, BS present Genital Exam: not indicated Ext: No edema, warm Neuro: Alert and oriented, No gross deficits   Assessment/Plan:  Annual physical exam Reviewed past medical, surgical, and social history.  Medications updated in epic.  Health maintenance reviewed and up-to-date. Encouraged working towards a well-balanced diet and staying  physically active.  Refilled Lipitor for primary prevention.   Seasonal allergies Rx'd Claritin.  S/P lobectomy of lung Follow-up with cardiothoracic surgery with repeat CT at the end of this month.  Tobacco abuse Resolved.  Quit approximately 10 months ago, congratulated him on his efforts.   Follow-up in 6 months or sooner if needed.   Patriciaann Clan, DO 03/22/2020 2:54 PM

## 2020-03-22 NOTE — Patient Instructions (Addendum)
It was wonderful to see you again today.  Keep doing a wonderful job at getting back some weight and staying physically active.  We are going to check your cholesterol and blood level today, should know these results in the next few days.  I have additionally sent in refills of your cholesterol medicine and an allergy pill.  Keep an eye on your blood pressure, you can check this at home with a blood pressure cuff or at a [facility] whenever you go to get your medications.  If consistently >130/80, please come in to see me.  Please follow-up in 6 months or sooner if needed.

## 2020-03-23 ENCOUNTER — Encounter: Payer: Self-pay | Admitting: Family Medicine

## 2020-03-23 LAB — LIPID PANEL
Chol/HDL Ratio: 3.7 ratio (ref 0.0–5.0)
Cholesterol, Total: 187 mg/dL (ref 100–199)
HDL: 50 mg/dL (ref >39)
LDL Chol Calc (NIH): 116 mg/dL — ABNORMAL HIGH (ref 0–99)
Triglycerides: 119 mg/dL (ref 0–149)
VLDL Cholesterol Cal: 21 mg/dL (ref 5–40)

## 2020-03-23 LAB — CBC WITH DIFFERENTIAL/PLATELET
Basophils Absolute: 0.1 10*3/uL (ref 0.0–0.2)
Basos: 1 %
EOS (ABSOLUTE): 0.2 10*3/uL (ref 0.0–0.4)
Eos: 3 %
Hematocrit: 40.9 % (ref 37.5–51.0)
Hemoglobin: 13.7 g/dL (ref 13.0–17.7)
Immature Grans (Abs): 0 10*3/uL (ref 0.0–0.1)
Immature Granulocytes: 0 %
Lymphocytes Absolute: 2 10*3/uL (ref 0.7–3.1)
Lymphs: 23 %
MCH: 28.4 pg (ref 26.6–33.0)
MCHC: 33.5 g/dL (ref 31.5–35.7)
MCV: 85 fL (ref 79–97)
Monocytes Absolute: 0.6 10*3/uL (ref 0.1–0.9)
Monocytes: 7 %
Neutrophils Absolute: 6 10*3/uL (ref 1.4–7.0)
Neutrophils: 66 %
Platelets: 323 10*3/uL (ref 150–450)
RBC: 4.83 x10E6/uL (ref 4.14–5.80)
RDW: 13.4 % (ref 11.6–15.4)
WBC: 9 10*3/uL (ref 3.4–10.8)

## 2020-04-13 ENCOUNTER — Other Ambulatory Visit: Payer: Self-pay

## 2020-04-13 ENCOUNTER — Ambulatory Visit: Payer: 59 | Admitting: Cardiothoracic Surgery

## 2020-04-13 ENCOUNTER — Encounter: Payer: Self-pay | Admitting: Cardiothoracic Surgery

## 2020-04-13 ENCOUNTER — Ambulatory Visit
Admission: RE | Admit: 2020-04-13 | Discharge: 2020-04-13 | Disposition: A | Discharge status: Home / Self Care | Payer: 59 | Source: Ambulatory Visit | Attending: Cardiothoracic Surgery | Admitting: Cardiothoracic Surgery

## 2020-04-13 DIAGNOSIS — Z902 Acquired absence of lung [part of]: Secondary | ICD-10-CM

## 2020-04-13 DIAGNOSIS — Z09 Encounter for follow-up examination after completed treatment for conditions other than malignant neoplasm: Secondary | ICD-10-CM | POA: Insufficient documentation

## 2020-04-13 MED ORDER — IOPAMIDOL (ISOVUE-300) INJECTION 61%
75.0000 mL | Freq: Once | INTRAVENOUS | Status: AC | PRN
  Administered 2020-04-13: 75 mL via INTRAVENOUS

## 2020-04-13 NOTE — Progress Notes (Signed)
PCP is Patriciaann Clan, DO Referring Provider is Patriciaann Clan, DO  Chief Complaint  Patient presents with  . Follow-up    f/u with Chest CT, Hx lung cancer/L lower lobectomy 01/2018     HPI: Patient returns with CT scan of the chest for 2-year postop surveillance. He underwent left VATS left lower lobectomy for squamous cell carcinoma 2019.  He has stopped smoking.  He denies any pulmonary symptoms.  Past Medical History:  Diagnosis Date  . Anxiety state 03/08/2018  . Bursitis of left shoulder   . Essential hypertension 01/31/2018   In 2019, has not been on medication since this time.  [name] GERD (gastroesophageal reflux disease)   . Hyperlipidemia   . Restless leg syndrome   . Squamous cell lung cancer [facility])     Past Surgical History:  Procedure Laterality Date  . CIRCUMCISION  12/13/2003  . COLONOSCOPY    . VIDEO ASSISTED THORACOSCOPY (VATS)/ LOBECTOMY Left 01/17/2018   Procedure: left VIDEO ASSISTED THORACOSCOPY (VATS) with left lower lobe wedge resection and LOBECTOMY;  Surgeon: Ivin Poot, MD;  Location: [facility] OR;  Service: Thoracic;  Laterality: Left;    Family History  Problem Relation Age of Onset  . Esophageal cancer Brother   . Colon cancer Father 36  . Arthritis Other     Social History Social History   Tobacco Use  . Smoking status: Former Smoker    Packs/day: 0.10    Years: 42.00    Pack years: 4.20    Types: E-cigarettes, Cigars    Start date: 09/17/1974    Quit date: 12/22/2017    Years since quitting: 2.3  . Smokeless tobacco: Never Used  . Tobacco comment: Quit one month ago.   Vaping Use  . Vaping Use: Some days  Substance Use Topics  . Alcohol use: Not Currently  . Drug use: Yes    Frequency: 7.0 times per week    Types: Marijuana    Comment: smokes marijuana; crack use distant past >30 yrs ago    Current Outpatient Medications  Medication Sig Dispense Refill  . atorvastatin (LIPITOR) 40 MG tablet Take 1 tablet (40 mg total) by mouth  daily. 90 tablet 3  . betamethasone valerate ointment (VALISONE) 0.1 % Apply 1 application topically 2 (two) times daily as needed.    . ENSURE (ENSURE) Take 237 mLs by mouth 2 (two) times daily between meals.    . fish oil-omega-3 fatty acids 1000 MG capsule Take 2 g by mouth daily.    [name] gabapentin (NEURONTIN) 600 MG tablet TAKE 1 TABLET BY MOUTH AT BEDTIME 90 tablet 2  . loratadine (CLARITIN) 10 MG tablet Take 1 tablet (10 mg total) by mouth daily. 30 tablet 3  . Multiple Vitamin (MULTIVITAMIN) tablet Take 1 tablet by mouth daily.     No current facility-administered medications for this visit.    Allergies  Allergen Reactions  . Lactose Intolerance (Gi) Nausea Only and Other (See Comments)    Unsettled bowel.     Review of Systems                     Review of Systems :  [ y ] = yes, [  ] = no        General :  Weight gain [   ]    Weight loss  [   ]  Fatigue [  ]  Fever [  ]  Chills  [  ]  HEENT    Headache [  ]  Dizziness [  ]  Blurred vision [  ] Glaucoma  [  ]                          Nosebleeds [  ] Painful or loose teeth [  ]        Cardiac :  Chest pain/ pressure [  ]  Resting SOB [  ] exertional SOB [  ]                        Orthopnea [  ]  Pedal edema  [  ]  Palpitations [  ] Syncope/presyncope [ ]                         Paroxysmal nocturnal dyspnea [  ]         Pulmonary : cough [  ]  wheezing [  ]  Hemoptysis [  ] Sputum [  ] Snoring [  ]                              Pneumothorax [  ]  Sleep apnea [  ]        GI : Vomiting [  ]  Dysphagia [  ]  Melena  [  ]  Abdominal pain [  ] BRBPR [  ]              Heart burn [  ]  Constipation [  ] Diarrhea  [  ] Colonoscopy [   ]        GU : Hematuria [  ]  Dysuria [  ]  Nocturia [  ] UTI's [  ]        Vascular : Claudication [  ]  Rest pain [  ]  DVT [  ] Vein stripping [  ] leg ulcers [  ]                          TIA [  ] Stroke [  ]  Varicose veins [  ]        NEURO :   Headaches  [  ] Seizures [  ] Vision changes [x  ] Paresthesias [  ]                   residual neuropathic pain from left VATS incision                            Musculoskeletal :  Arthritis [  ] Gout  [  ]  Back pain [  ]  Joint pain [  ]        Skin :  Rash [  ]  Melanoma [  ] Sores [  ]        Heme : Bleeding problems [  ]Clotting Disorders [  ] Anemia [  ]Blood Transfusion [ ]         Endocrine : Diabetes [  ] Heat or Cold intolerance [  ] Polyuria [  ]excessive thirst [ ]         Psych : Depression [  ]  Anxiety [  ]  Psych hospitalizations [  ]  Memory change [  ]                                                                            BP (!) 126/86   Pulse 63   Temp 97.9 F (36.6 C) (Temporal)   Resp 16   Ht 6' (1.829 m)   Wt 125 lb (56.7 kg)   SpO2 100%   BMI 16.95 kg/m  Physical Exam      Physical Exam  General:  HEENT: Normocephalic pupils equal , dentition adequate Neck: Supple without JVD, adenopathy, or bruit Chest: Clear to auscultation, symmetrical breath sounds, no rhonchi, no tenderness             or deformity Cardiovascular: Regular rate and rhythm, no murmur, no gallop, peripheral pulses             palpable in all extremities Abdomen:  Soft, nontender, no palpable mass or organomegaly Extremities: Warm, well-perfused, no clubbing cyanosis edema or tenderness,              no venous stasis changes of the legs Rectal/GU: Deferred Neuro: Grossly non--focal and symmetrical throughout Skin: Clean and dry without rash or ulceration   Diagnostic Tests: CT images performed today show no evidence of recurrent or new pulmonary malignancy.  Impression: Doing well 2 years postop left VATS left lower lobectomy for stage I squamous cell cancer.  Continue annual surveillance scans out to 5 years postop.  Plan:  Return in 1 year with CT scan of chest with IV contrast for surveillance after left lower lobectomy for cancer. Len Childs, MD [facility] [phone]

## 2020-04-13 NOTE — Progress Notes (Signed)
16

## 2020-05-07 ENCOUNTER — Other Ambulatory Visit: Payer: Self-pay | Admitting: Family Medicine

## 2020-05-07 DIAGNOSIS — E785 Hyperlipidemia, unspecified: Secondary | ICD-10-CM

## 2021-02-23 ENCOUNTER — Other Ambulatory Visit: Payer: Self-pay | Admitting: *Deleted

## 2021-02-23 DIAGNOSIS — Z85118 Personal history of other malignant neoplasm of bronchus and lung: Secondary | ICD-10-CM

## 2021-03-11 ENCOUNTER — Other Ambulatory Visit: Payer: Self-pay | Admitting: Family Medicine

## 2021-04-14 ENCOUNTER — Other Ambulatory Visit: Payer: Self-pay

## 2021-04-14 ENCOUNTER — Ambulatory Visit
Admission: RE | Admit: 2021-04-14 | Discharge: 2021-04-14 | Disposition: A | Discharge status: Home / Self Care | Payer: 59 | Source: Ambulatory Visit | Attending: Thoracic Surgery (Cardiothoracic Vascular Surgery) | Admitting: Thoracic Surgery (Cardiothoracic Vascular Surgery)

## 2021-04-14 ENCOUNTER — Ambulatory Visit: Payer: 59 | Admitting: Thoracic Surgery (Cardiothoracic Vascular Surgery)

## 2021-04-14 ENCOUNTER — Encounter: Payer: Self-pay | Admitting: Thoracic Surgery (Cardiothoracic Vascular Surgery)

## 2021-04-14 VITALS — BP 150/88 | HR 76 | Resp 20 | Ht 72.0 in | Wt 125.0 lb

## 2021-04-14 DIAGNOSIS — I700 Atherosclerosis of aorta: Secondary | ICD-10-CM | POA: Diagnosis not present

## 2021-04-14 DIAGNOSIS — Z85118 Personal history of other malignant neoplasm of bronchus and lung: Secondary | ICD-10-CM

## 2021-04-14 DIAGNOSIS — J929 Pleural plaque without asbestos: Secondary | ICD-10-CM | POA: Diagnosis not present

## 2021-04-14 DIAGNOSIS — C3490 Malignant neoplasm of unspecified part of unspecified bronchus or lung: Secondary | ICD-10-CM | POA: Diagnosis not present

## 2021-04-14 DIAGNOSIS — J439 Emphysema, unspecified: Secondary | ICD-10-CM | POA: Diagnosis not present

## 2021-04-14 MED ORDER — IOPAMIDOL (ISOVUE-300) INJECTION 61%
75.0000 mL | Freq: Once | INTRAVENOUS | Status: AC | PRN
  Administered 2021-04-14: 75 mL via INTRAVENOUS

## 2021-04-14 NOTE — Progress Notes (Signed)
      [address]       [place] [address]             [phone]        Laird C Quraishi Murchison Medical Record #[id number] Date of Birth: 01-15-1956  Referring: Patriciaann Clan, DO Primary Care: Eulis Foster, MD Primary Cardiologist:James Hochrein, MD  Reason for visit:   follow-up  History of Present Illness:     65 year old male status post left thoracoscopy, and left lower lobectomy for squamous cell carcinoma in 2019 by Dr. Darcey Nora presents for his 1 year follow-up.  He does complain of some occasional paresthesias along his left chest wall.  Physical Exam: BP (!) 150/88   Pulse 76   Resp 20   Ht 6' (1.829 m)   Wt 125 lb (56.7 kg)   SpO2 97% Comment: RA  BMI 16.95 kg/m   Alert NAD Incision well-healed.   Abdomen soft, ND No peripheral edema   Diagnostic Studies & Laboratory data: CT chest was reviewed: No evidence of recurrent or metastatic disease.    Assessment / Plan:   65 year old male status post left VATS left lower lobectomy for a stage I squamous cell cancer.  He comes for his 1 year follow-up.  His CT chest is negative for recurrent or metastatic disease.  He is no longer smoking cigarettes but continues to smoke marijuana.  He will follow-up in 1 year with another CT chest.   Lajuana Matte 04/14/2021 3:54 PM

## 2021-04-18 ENCOUNTER — Other Ambulatory Visit: Payer: Self-pay | Admitting: Family Medicine

## 2021-04-18 DIAGNOSIS — E785 Hyperlipidemia, unspecified: Secondary | ICD-10-CM

## 2021-04-18 DIAGNOSIS — T7840XS Allergy, unspecified, sequela: Secondary | ICD-10-CM

## 2021-05-14 ENCOUNTER — Other Ambulatory Visit: Payer: Self-pay | Admitting: Family Medicine

## 2021-05-14 DIAGNOSIS — T7840XS Allergy, unspecified, sequela: Secondary | ICD-10-CM

## 2021-05-28 ENCOUNTER — Other Ambulatory Visit: Payer: Self-pay | Admitting: Family Medicine

## 2021-05-28 DIAGNOSIS — E785 Hyperlipidemia, unspecified: Secondary | ICD-10-CM

## 2021-10-10 ENCOUNTER — Ambulatory Visit (INDEPENDENT_AMBULATORY_CARE_PROVIDER_SITE_OTHER): Payer: PPO | Admitting: Student

## 2021-10-10 ENCOUNTER — Encounter: Payer: Self-pay | Admitting: Student

## 2021-10-10 ENCOUNTER — Other Ambulatory Visit: Payer: Self-pay

## 2021-10-10 ENCOUNTER — Ambulatory Visit (INDEPENDENT_AMBULATORY_CARE_PROVIDER_SITE_OTHER): Payer: PPO

## 2021-10-10 VITALS — BP 154/91 | HR 80 | Ht 72.0 in | Wt 128.2 lb

## 2021-10-10 DIAGNOSIS — Z0000 Encounter for general adult medical examination without abnormal findings: Secondary | ICD-10-CM | POA: Diagnosis not present

## 2021-10-10 DIAGNOSIS — L281 Prurigo nodularis: Secondary | ICD-10-CM

## 2021-10-10 DIAGNOSIS — Z23 Encounter for immunization: Secondary | ICD-10-CM | POA: Diagnosis not present

## 2021-10-10 DIAGNOSIS — G2581 Restless legs syndrome: Secondary | ICD-10-CM

## 2021-10-10 DIAGNOSIS — R7303 Prediabetes: Secondary | ICD-10-CM

## 2021-10-10 DIAGNOSIS — Z131 Encounter for screening for diabetes mellitus: Secondary | ICD-10-CM | POA: Diagnosis not present

## 2021-10-10 DIAGNOSIS — R030 Elevated blood-pressure reading, without diagnosis of hypertension: Secondary | ICD-10-CM | POA: Insufficient documentation

## 2021-10-10 DIAGNOSIS — E785 Hyperlipidemia, unspecified: Secondary | ICD-10-CM | POA: Diagnosis not present

## 2021-10-10 LAB — POCT GLYCOSYLATED HEMOGLOBIN (HGB A1C): Hemoglobin A1C: 6 % — AB (ref 4.0–5.6)

## 2021-10-10 MED ORDER — ROPINIROLE HCL 2 MG PO TABS: 1.0000 mg | ORAL_TABLET | Freq: Every day | ORAL | 1 refills | Status: DC

## 2021-10-10 MED ORDER — BETAMETHASONE VALERATE 0.1 % EX OINT: 1.0000 "application " | TOPICAL_OINTMENT | Freq: Two times a day (BID) | CUTANEOUS | 0 refills | Status: DC

## 2021-10-10 MED ORDER — GABAPENTIN 600 MG PO TABS: 600.0000 mg | ORAL_TABLET | Freq: Every day | ORAL | 1 refills | Status: DC

## 2021-10-10 MED ORDER — ATORVASTATIN CALCIUM 40 MG PO TABS: 40.0000 mg | ORAL_TABLET | Freq: Every day | ORAL | 3 refills | Status: DC

## 2021-10-10 NOTE — Assessment & Plan Note (Signed)
BP 154/91 in clinic today, remained elevated on recheck. Per chart review patient also had a reading of 150/88 at his last cardiothoracic surgery visit. Patient to monitor BP in community over the next 2 months and then follow up for BP check. If pressures remain elevated, consider initiating therapy.

## 2021-10-10 NOTE — Assessment & Plan Note (Signed)
Chronic, stable. Refilled betamethasone ointment

## 2021-10-10 NOTE — Assessment & Plan Note (Signed)
Symptoms inadequately controlled on gabapentin alone.  - Will initiate ropinirole, may need to adjust dose at follow-up - Also refilled gabapentin in case patient has inadequate response to ropinirole - Will check CBC, ferritin to evaluate for iron deficiency

## 2021-10-10 NOTE — Assessment & Plan Note (Signed)
New diagnosis today with A1c 6.0%. Patient identifies cutting out sodas as a possible first-line intervention.  - Will discuss further at follow up in 2 months

## 2021-10-10 NOTE — Progress Notes (Signed)
° ° °  SUBJECTIVE:   Chief compliant/HPI: annual examination  Curtis Henry is a 65 y.o. who presents today for an annual exam.   History tabs reviewed and updated he has a history of squamous cell lung cancer s/p thoracoscopy and LLL lobectomy in 2019 recent CT chest was negative for recurrent disease.   Home meds include atorvastatin 40mg , gabapentin 600mg  QHS and betamethasone ointment for prurigo nodularis.   He does not use alcohol and cut back significantly on cigarettes in 2021 mostly, occasionally will have 4 or so in one week. He currently smokes marijuana "a lot" at night to help relax. He works for a [employer] and is very physically active at work. He is pleased that his weight has started to come up since he cut back on smoking and treated his cancer.   He is not sexually active and has not been since his wife passed away three years ago.   Review of systems form reviewed and notable for sleep issues 2/2 restless legs. He says that he takes gabapentin 600mg  nightly but that he continues to have troublesome symptoms that keep him awake at night and make him "jump" and "kick."   OBJECTIVE:   BP (!) 154/91    Pulse 80    Ht 6' (1.829 m)    Wt 128 lb 3.2 oz (58.2 kg)    SpO2 100%    BMI 17.39 kg/m   Physical Exam Vitals reviewed.  Constitutional:      General: He is not in acute distress. Cardiovascular:     Rate and Rhythm: Normal rate and regular rhythm.     Pulses: Normal pulses.  Pulmonary:     Effort: Pulmonary effort is normal.     Breath sounds: Normal breath sounds.  Abdominal:     General: Abdomen is flat.     Palpations: Abdomen is soft.  Neurological:     General: No focal deficit present.     Mental Status: He is alert.  Psychiatric:        Mood and Affect: Mood normal.     ASSESSMENT/PLAN:   Elevated blood pressure reading BP 154/91 in clinic today, remained elevated on recheck. Per chart review patient also had a reading of 150/88 at his last  cardiothoracic surgery visit. Patient to monitor BP in community over the next 2 months and then follow up for BP check. If pressures remain elevated, consider initiating therapy.   Prurigo nodularis Chronic, stable. Refilled betamethasone ointment  Annual physical exam Patient amenable to receiving COVID booster vaccine today - Bivalent booster administered  Restless legs syndrome Symptoms inadequately controlled on gabapentin alone.  - Will initiate ropinirole, may need to adjust dose at follow-up - Also refilled gabapentin in case patient has inadequate response to ropinirole - Will check CBC, ferritin to evaluate for iron deficiency  Prediabetes New diagnosis today with A1c 6.0%. Patient identifies cutting out sodas as a possible first-line intervention.  - Will discuss further at follow up in 2 months    Annual Examination  See AVS for age appropriate recommendations.  PHQ score 5reviewed and discussed.  Blood pressure value is above goal, discussed.   Colorectal cancer screening: up to date on screening for CRC.  PSA discussed and after engaging in discussion of possible risks, benefits and complications of screening patient elected to defer screening.   Follow up in 1 year or sooner if indicated.    Pearla Dubonnet, MD [place]

## 2021-10-10 NOTE — Patient Instructions (Signed)
Mr. Illescas,  It is such a joy to take care you! Thank you for coming in today.   As a reminder, here is a recap of what we talked about today:  - I am so glad to hear about the cutting back that you have done with your cigarettes and the weight gain that you have had!  - We are giving you your COVID booster, you may feel a bit under the weather for the next 24 hours but should be back to yourself in time for your cruise - We are checking your iron levels as this may be contributing to your leg symptoms at night - I am sending in a new medicine for your legs. It is called ropinirole or "Requip." You will start by taking 1/2 tablet per night for one week. If you are feeling okay about this you can then increase to taking 1 whole table each night. We will adjust the dosing of this at your next visit. I am refilling your gabapentin in case the ropinirole does not work for you.  - I want you to check your blood pressure several times over the next 6-8 weeks. You can do this at home or at the pharmacy or [facility] or anywhere you can find a cuff. Keep a note or journal of these readings so we can discuss at follow-up.  - I would like to see you in 2 months.   We are checking some labs today. I will call you if they are abnormal. I will send you a MyChart message or a letter if they are normal.  If you do not hear about your labs in the next 2 weeks please let us know.   Take care and seek immediate care sooner if you develop any concerns.   Marnee Guarneri, MD [place]

## 2021-10-10 NOTE — Assessment & Plan Note (Addendum)
Patient amenable to receiving COVID booster vaccine today - Bivalent booster administered

## 2021-10-11 ENCOUNTER — Encounter: Payer: Self-pay | Admitting: Student

## 2021-10-11 LAB — CBC
Hematocrit: 46.2 % (ref 37.5–51.0)
Hemoglobin: 15.4 g/dL (ref 13.0–17.7)
MCH: 28.6 pg (ref 26.6–33.0)
MCHC: 33.3 g/dL (ref 31.5–35.7)
MCV: 86 fL (ref 79–97)
Platelets: 221 10*3/uL (ref 150–450)
RBC: 5.39 x10E6/uL (ref 4.14–5.80)
RDW: 13.9 % (ref 11.6–15.4)
WBC: 7.5 10*3/uL (ref 3.4–10.8)

## 2021-10-11 LAB — FERRITIN: Ferritin: 76 ng/mL (ref 30–400)

## 2022-02-19 ENCOUNTER — Other Ambulatory Visit: Payer: Self-pay | Admitting: Thoracic Surgery (Cardiothoracic Vascular Surgery)

## 2022-02-19 DIAGNOSIS — Z902 Acquired absence of lung [part of]: Secondary | ICD-10-CM

## 2022-02-19 DIAGNOSIS — Z85118 Personal history of other malignant neoplasm of bronchus and lung: Secondary | ICD-10-CM

## 2022-02-20 ENCOUNTER — Encounter: Payer: Self-pay | Admitting: *Deleted

## 2022-03-25 ENCOUNTER — Other Ambulatory Visit: Payer: Self-pay | Admitting: Student

## 2022-04-24 NOTE — Progress Notes (Signed)
      [address]       [place] [address]             [phone]        Brookes C Kimmer Alexander Medical Record #[id number] Date of Birth: 16-Sep-1956  Referring: Donnal Moat* Primary Care: Precious Gilding, DO Primary Cardiologist:James Hochrein, MD  Reason for visit:   follow-up  History of Present Illness:     66 year old male status post left thoracoscopy, and left lower lobectomy for squamous cell carcinoma in 2019 by Dr. Darcey Nora presents for his 1 year follow-up.   Physical Exam: There were no vitals taken for this visit.  Alert NAD Abdomen, ND No peripheral edema   Diagnostic Studies & Laboratory data: CT chest:   IMPRESSION: 1. Stable postoperative chest. No evidence of local recurrence or metastatic disease. 2. Stable small right lung solid and ground-glass nodules. 3. Aortic Atherosclerosis (ICD10-I70.0) and Emphysema (ICD10-J43.9).  Assessment / Plan:   66 year old male status post left VATS left lower lobectomy for a stage I squamous cell cancer.  He comes for his 1 year follow-up.  His CT chest is negative for recurrent or metastatic disease.  He will follow-up in 1 year for his last CT chest, and then he will rollover to lung cancer screening.   Lajuana Matte 04/24/2022 12:33 PM

## 2022-04-27 ENCOUNTER — Ambulatory Visit: Payer: PPO | Admitting: Thoracic Surgery (Cardiothoracic Vascular Surgery)

## 2022-04-27 ENCOUNTER — Ambulatory Visit
Admission: RE | Admit: 2022-04-27 | Discharge: 2022-04-27 | Disposition: A | Discharge status: Home / Self Care | Payer: PPO | Source: Ambulatory Visit | Attending: Thoracic Surgery (Cardiothoracic Vascular Surgery) | Admitting: Thoracic Surgery (Cardiothoracic Vascular Surgery)

## 2022-04-27 VITALS — BP 170/90 | HR 65 | Resp 20 | Ht 72.0 in | Wt 124.0 lb

## 2022-04-27 DIAGNOSIS — Z85118 Personal history of other malignant neoplasm of bronchus and lung: Secondary | ICD-10-CM

## 2022-04-27 DIAGNOSIS — Z902 Acquired absence of lung [part of]: Secondary | ICD-10-CM

## 2022-07-22 ENCOUNTER — Other Ambulatory Visit: Payer: Self-pay | Admitting: Student

## 2022-09-02 ENCOUNTER — Emergency Department (HOSPITAL_COMMUNITY): Payer: PPO

## 2022-09-02 ENCOUNTER — Other Ambulatory Visit: Payer: Self-pay

## 2022-09-02 ENCOUNTER — Emergency Department (HOSPITAL_COMMUNITY)
Admission: EM | Admit: 2022-09-02 | Discharge: 2022-09-03 | Discharge status: Against Medical Advice | Payer: PPO | Attending: Emergency Medicine | Admitting: Emergency Medicine

## 2022-09-02 DIAGNOSIS — Z5321 Procedure and treatment not carried out due to patient leaving prior to being seen by health care provider: Secondary | ICD-10-CM | POA: Diagnosis not present

## 2022-09-02 DIAGNOSIS — Z85118 Personal history of other malignant neoplasm of bronchus and lung: Secondary | ICD-10-CM | POA: Insufficient documentation

## 2022-09-02 DIAGNOSIS — U071 COVID-19: Secondary | ICD-10-CM | POA: Insufficient documentation

## 2022-09-02 DIAGNOSIS — R059 Cough, unspecified: Secondary | ICD-10-CM | POA: Diagnosis present

## 2022-09-02 LAB — CBC
HCT: 53.5 % — ABNORMAL HIGH (ref 39.0–52.0)
Hemoglobin: 17.4 g/dL — ABNORMAL HIGH (ref 13.0–17.0)
MCH: 28.7 pg (ref 26.0–34.0)
MCHC: 32.5 g/dL (ref 30.0–36.0)
MCV: 88.3 fL (ref 80.0–100.0)
Platelets: 175 10*3/uL (ref 150–400)
RBC: 6.06 MIL/uL — ABNORMAL HIGH (ref 4.22–5.81)
RDW: 16.4 % — ABNORMAL HIGH (ref 11.5–15.5)
WBC: 5 10*3/uL (ref 4.0–10.5)
nRBC: 0 % (ref 0.0–0.2)

## 2022-09-02 LAB — COMPREHENSIVE METABOLIC PANEL
ALT: 25 U/L (ref 0–44)
AST: 38 U/L (ref 15–41)
Albumin: 3.7 g/dL (ref 3.5–5.0)
Alkaline Phosphatase: 64 U/L (ref 38–126)
Anion gap: 9 (ref 5–15)
BUN: 12 mg/dL (ref 8–23)
CO2: 25 mmol/L (ref 22–32)
Calcium: 8.7 mg/dL — ABNORMAL LOW (ref 8.9–10.3)
Chloride: 105 mmol/L (ref 98–111)
Creatinine, Ser: 1.09 mg/dL (ref 0.61–1.24)
GFR, Estimated: >60 mL/min (ref >60)
Glucose, Bld: 89 mg/dL (ref 70–99)
Potassium: 4.4 mmol/L (ref 3.5–5.1)
Sodium: 139 mmol/L (ref 135–145)
Total Bilirubin: 0.6 mg/dL (ref 0.3–1.2)
Total Protein: 7.6 g/dL (ref 6.5–8.1)

## 2022-09-02 LAB — URINALYSIS, ROUTINE W REFLEX MICROSCOPIC
Bacteria, UA: NONE SEEN
Bilirubin Urine: NEGATIVE
Glucose, UA: NEGATIVE mg/dL
Ketones, ur: 5 mg/dL — AB
Leukocytes,Ua: NEGATIVE
Nitrite: NEGATIVE
Protein, ur: 30 mg/dL — AB
Specific Gravity, Urine: 1.019 (ref 1.005–1.030)
pH: 5 (ref 5.0–8.0)

## 2022-09-02 LAB — RESP PANEL BY RT-PCR (RSV, FLU A&B, COVID)  RVPGX2
Influenza A by PCR: NEGATIVE
Influenza B by PCR: NEGATIVE
Resp Syncytial Virus by PCR: NEGATIVE
SARS Coronavirus 2 by RT PCR: POSITIVE — AB

## 2022-09-02 LAB — LIPASE, BLOOD: Lipase: 43 U/L (ref 11–51)

## 2022-09-02 NOTE — ED Provider Triage Note (Signed)
Emergency Medicine Provider Triage Evaluation Note  Curtis Henry , a 66 y.o. male  was evaluated in triage.  Pt complains of generalized body ache, headache, nonproductive cough, nausea, vomiting and diarrhea x 3 days.  Denies any recent antibiotic use, no recent travel.  No blood in the stool.  Lung cancer 5 years intermission status post lobectomy..  Review of Systems  Per HPI  Physical Exam  BP 122/84 (BP Location: Left Arm)   Pulse 70   Temp 99.2 F (37.3 C) (Oral)   Resp 16   SpO2 98%  Gen:   Awake, no distress   Resp:  Normal effort/. wheeze MSK:   Moves extremities without difficulty  Other:  Abdomen is soft  Medical Decision Making  Medically screening exam initiated at 8:06 PM.  Appropriate orders placed.  Curtis Henry was informed that the remainder of the evaluation will be completed by another provider, this initial triage assessment does not replace that evaluation, and the importance of remaining in the ED until their evaluation is complete.     Sherrill Raring, PA-C 09/02/22 2007

## 2022-09-02 NOTE — ED Triage Notes (Addendum)
Vomiting, diarrhea, generalized body aches, dizziness when he stands up since Thursday. Pt grandson has the stomach virus

## 2022-09-03 NOTE — ED Notes (Signed)
Called to lobby to speak with patient. Pt is upset about wait time. I explained bedding process and delay in getting to treatment bed at this time. Pt repetitively  says "I am leaving" I encouraged pt to stay for further evaluation, "yeah I am going to sue you" and is increasingly upset. I did tell pt he has covid and asked if he would be willing to wear a mask, pt says that he only has one lung and not able to wear a mask. "It took this long for you to figure that out?" I discussed with pt that his treatment is not yet complete, that he only had the initial things done and when he gets to a treatment room, that the provider would discuss with him further a treatment plan. I apologized again for delay, pt ambulatory to sit with his family in the lobby.

## 2022-09-05 ENCOUNTER — Ambulatory Visit (INDEPENDENT_AMBULATORY_CARE_PROVIDER_SITE_OTHER): Payer: PPO | Admitting: Student

## 2022-09-05 VITALS — Ht 72.0 in | Wt 129.0 lb

## 2022-09-05 DIAGNOSIS — I159 Secondary hypertension, unspecified: Secondary | ICD-10-CM | POA: Diagnosis not present

## 2022-09-05 DIAGNOSIS — R42 Dizziness and giddiness: Secondary | ICD-10-CM

## 2022-09-05 DIAGNOSIS — I480 Paroxysmal atrial fibrillation: Secondary | ICD-10-CM | POA: Diagnosis not present

## 2022-09-05 MED ORDER — HYDROCHLOROTHIAZIDE 12.5 MG PO CAPS: 12.5000 mg | ORAL_CAPSULE | Freq: Every day | ORAL | 1 refills | Status: DC

## 2022-09-05 NOTE — Patient Instructions (Signed)
It was great to see you! Thank you for allowing me to participate in your care!  I recommend that you always bring your medications to each appointment as this makes it easy to ensure we are on the correct medications and helps [place] not miss when refills are needed.  Our plans for today:  - Dizziness -Heart Monitor (Zio Patch), someone will contact you about setting up. -call clinic in 1 week if you've not heard from anyone -Sit/lay down immediately if feeling dizzy/light headed -Stay well hydrated!  -High Blood Pressure   Starting you on HCTZ 12.5 mg daily  Make appointment for Blood pressure check in 1 week   Take care and seek immediate care sooner if you develop any concerns.   Dr. Holley Bouche, MD [place]

## 2022-09-05 NOTE — Progress Notes (Addendum)
SUBJECTIVE:   CHIEF COMPLAINT / HPI:   Dizzy spells, HA and  Seen in ED 12/17 for body aches and diarrhea but left due to wait time. Intial labs showed Covid +, neg cxr,   Dizzy spells Started about a year ago, and then eased off, but lately has been an issue. Feels like he will passout at times and he has to hold onto something to keep from falling. Describes the dizziness as light headedness, denies feeling like room is spending. Has fallen once, fell on bottom and then popped right back up. Happens during the day time at work, and sometimes at home. Happens when he get's up from sitting/laying. Has happened while driving once. Spells last few minutes up to 10-15 min. Having 3-4 spells a week. Keeps snacks in truck and eats something to help, and after eating he won't have any issues afterwards. Will only happen once in a day. Appreciates HR goes up everytime it happens, but doesn't not appreciate any chest pain, sweating, or SOB. Patient has hx of PAF, that was supposed to be from the lung lobectomy in 2019.   PERTINENT  PMH / PSH:     OBJECTIVE:  Ht 6' (1.829 m)   Wt 129 lb (58.5 kg)   BMI 17.50 kg/m  Physical Exam Constitutional:      General: He is not in acute distress.    Appearance: He is well-developed.  Cardiovascular:     Rate and Rhythm: Normal rate and regular rhythm.     Heart sounds: Normal heart sounds. No murmur heard.    No friction rub. No gallop.  Pulmonary:     Effort: Pulmonary effort is normal. No respiratory distress.     Breath sounds: Normal breath sounds. No stridor. No wheezing or rhonchi.  Neurological:     Mental Status: He is alert. Mental status is at baseline.     Cranial Nerves: Cranial nerves 2-12 are intact. No cranial nerve deficit, dysarthria or facial asymmetry.     Sensory: Sensation is intact. No sensory deficit.     Motor: Motor function is intact. No weakness or tremor.  Psychiatric:        Mood and Affect: Mood normal.       ASSESSMENT/PLAN:  Dizziness Assessment & Plan: Patient comes in w/ complaints of dizziness/light headedness that occur sporadically. Patient appreciates that HR goes up, but denies any CP or SOB, or sweating. Also notes that he feels like he'll pass out, not like room is spinning. Patient has passed out from one spell, but quickly regained consciousness when he hit the ground. Patient reports episodes last 10-15 min and occur 3-4 times a week. Patient thought they may be related to low blood sugar, but eats snacks regularly throughout the day. Etiology of these spells concerning. Low concern for BPPV given symptoms. Considered hypotension, however patient BP is normally elevated, in the 144'R systolic on presentation today. Orthostatic BP tested today and was negative. Patient has hx of A. Fib following lobectomy x1, thought to be one off event and not on tx for it. Given hx and symptoms I'm concerned about a arhythmia causing the dizziness/light headed ness. Reviewed the ECG from 2021 and it was NSR with normal PR, thus opted to not obtain EKG in office today. -Zio Patch for 2 wks   Orders: -     LONG TERM MONITOR-LIVE TELEMETRY (3-14 DAYS); Future  Paroxysmal atrial fibrillation (HCC) -     LONG TERM MONITOR-LIVE TELEMETRY (3-14 DAYS);  Future  Secondary hypertension Assessment & Plan: Patient BP elevated for last few visits, will initiate antihypertensive.  -HCTZ 12.5 mg - f/u 1 wk -BMP at f/u   Other orders -     hydroCHLOROthiazide; Take 1 capsule (12.5 mg total) by mouth daily.  Dispense: 30 capsule; Refill: 1   No follow-ups on file. Holley Bouche, MD 09/06/2022, 6:23 PM PGY-2, [place]

## 2022-09-06 DIAGNOSIS — R42 Dizziness and giddiness: Secondary | ICD-10-CM | POA: Insufficient documentation

## 2022-09-06 NOTE — Assessment & Plan Note (Addendum)
Patient comes in w/ complaints of dizziness/light headedness that occur sporadically. Patient appreciates that HR goes up, but denies any CP or SOB, or sweating. Also notes that he feels like he'll pass out, not like room is spinning. Patient has passed out from one spell, but quickly regained consciousness when he hit the ground. Patient reports episodes last 10-15 min and occur 3-4 times a week. Patient thought they may be related to low blood sugar, but eats snacks regularly throughout the day. Etiology of these spells concerning. Low concern for BPPV given symptoms. Considered hypotension, however patient BP is normally elevated, in the 962'X systolic on presentation today. Orthostatic BP tested today and was negative. Patient has hx of A. Fib following lobectomy x1, thought to be one off event and not on tx for it. Given hx and symptoms I'm concerned about a arhythmia causing the dizziness/light headed ness. Reviewed the ECG from 2021 and it was NSR with normal PR, thus opted to not obtain EKG in office today. -Zio Patch for 2 wks

## 2022-09-06 NOTE — Assessment & Plan Note (Signed)
Patient BP elevated for last few visits, will initiate antihypertensive.  -HCTZ 12.5 mg - f/u 1 wk -BMP at f/u

## 2022-09-14 ENCOUNTER — Encounter: Payer: Self-pay | Admitting: Student

## 2022-09-14 ENCOUNTER — Other Ambulatory Visit: Payer: Self-pay

## 2022-09-14 ENCOUNTER — Ambulatory Visit (INDEPENDENT_AMBULATORY_CARE_PROVIDER_SITE_OTHER): Payer: PPO | Admitting: Student

## 2022-09-14 VITALS — BP 154/96 | HR 80 | Wt 127.2 lb

## 2022-09-14 DIAGNOSIS — I159 Secondary hypertension, unspecified: Secondary | ICD-10-CM

## 2022-09-14 DIAGNOSIS — R202 Paresthesia of skin: Secondary | ICD-10-CM

## 2022-09-14 DIAGNOSIS — R42 Dizziness and giddiness: Secondary | ICD-10-CM

## 2022-09-14 MED ORDER — HYDROCHLOROTHIAZIDE 25 MG PO TABS: 25.0000 mg | ORAL_TABLET | Freq: Every day | ORAL | 3 refills | Status: DC

## 2022-09-14 NOTE — Progress Notes (Signed)
    SUBJECTIVE:   CHIEF COMPLAINT / HPI:   Dizziness Patient seen on 12/20 due to dizzy spells which have been ongoing for the past year occurring a few times a week.  Orthostatic vitals were negative.  Patient admitted to fast heart rate during these episodes.  Zio patch for 2 weeks was ordered but pt has not received this. Since his last visit, has only had one dizzy episode. He feels they are starting to ease off and doesn't feel like his heart was beating as fast as in previous episodes.   HTN Patient started on hydrochlorothiazide 12.5 mg daily on 09/05/2022. He has been taking it daily but states he feels sleepy about 30 minutes after taking it. 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 He has been taking them in the mornings.   Tingling in extremities Intermittent tingling present in LUE and LLE for past 6 months. Used to occur at night time but now occurs during the day. Currently takes gabapentin 600 mg at night for restless leg.   PERTINENT  PMH / PSH: HTN, T2DM, restless leg  OBJECTIVE:   BP (!) 154/96   Pulse 80   Wt 127 lb 3.2 oz (57.7 kg)   SpO2 99%   BMI 17.25 kg/m    General: NAD, pleasant, able to participate in exam Cardiac: RRR, no murmurs.  Posterior tibial pulses +2/4 bilaterally Respiratory: CTAB, normal effort, No wheezes, rales or rhonchi Extremities: no edema of BLEs Skin: warm and dry Neuro: alert, no obvious focal deficits Psych: Normal affect and mood  ASSESSMENT/PLAN:   HTN (hypertension) BP still elevated today, will increase HCTZ, patient advised to take it at nighttime and as it seems to make him feel sleepy and when he takes it in the morning.  Hopefully this has not caused him to get up and urinate at night.  Will reassess at next visit and if needed can switch to a different medication. -HCTZ 25 mg -BMP  Dizziness Patient advised that due to the holidays and may take longer to get the Zio patch.  If he does not  receive it in 1 week, he should call the office or send a MyChart message to let us know and I will look into it  Tingling in extremities Patient is already on gabapentin in the evenings at bedtime.  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  Will check a CBC, TSH, BMP, and B12 to assess for causes of tingling.  Of note hemoglobin was elevated to over 17 last week.  Can consider polycythemia as cause of tingling and also dizziness but will recheck CBC as this could have been a lab error, hemoglobin was WNL year ago.    Dr. Precious Gilding, [place]

## 2022-09-14 NOTE — Patient Instructions (Signed)
It was great to see you! Thank you for allowing me to participate in your care!  I recommend that you always bring your medications to each appointment as this makes it easy to ensure you are on the correct medications and helps [place] not miss when refills are needed.  Our plans for today:  - Increase hydrochlorothiazide to 25 mg daily and take it at night before bed -If possible, check your blood pressure at home a couple times a week before our next appointment, write it down and bring it with you - Return in 2 weeks for a follow up visit  We are checking some labs today, I will call you if they are abnormal will send you a MyChart message or a letter if they are normal.  If you do not hear about your labs in the next 2 weeks please let us know.  Take care and seek immediate care sooner if you develop any concerns.   Dr. Precious Gilding, DO [facility]

## 2022-09-15 DIAGNOSIS — R202 Paresthesia of skin: Secondary | ICD-10-CM | POA: Insufficient documentation

## 2022-09-15 LAB — BASIC METABOLIC PANEL
BUN/Creatinine Ratio: 11 (ref 10–24)
BUN: 11 mg/dL (ref 8–27)
CO2: 29 mmol/L (ref 20–29)
Calcium: 9.9 mg/dL (ref 8.6–10.2)
Chloride: 100 mmol/L (ref 96–106)
Creatinine, Ser: 0.99 mg/dL (ref 0.76–1.27)
Glucose: 93 mg/dL (ref 70–99)
Potassium: 4.9 mmol/L (ref 3.5–5.2)
Sodium: 141 mmol/L (ref 134–144)
eGFR: 84 mL/min/{1.73_m2} (ref >59)

## 2022-09-15 LAB — CBC
Hematocrit: 49.1 % (ref 37.5–51.0)
Hemoglobin: 16.8 g/dL (ref 13.0–17.7)
MCH: 29 pg (ref 26.6–33.0)
MCHC: 34.2 g/dL (ref 31.5–35.7)
MCV: 85 fL (ref 79–97)
Platelets: 257 10*3/uL (ref 150–450)
RBC: 5.79 x10E6/uL (ref 4.14–5.80)
RDW: 13.5 % (ref 11.6–15.4)
WBC: 7.9 10*3/uL (ref 3.4–10.8)

## 2022-09-15 LAB — VITAMIN B12: Vitamin B-12: 1179 pg/mL (ref 232–1245)

## 2022-09-15 LAB — TSH: TSH: 0.887 u[IU]/mL (ref 0.450–4.500)

## 2022-09-15 NOTE — Assessment & Plan Note (Signed)
Patient is already on gabapentin in the evenings at bedtime.  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  Will check a CBC, TSH, BMP, and B12 to assess for causes of tingling.  Of note hemoglobin was elevated to over 17 last week.  Can consider polycythemia as cause of tingling and also dizziness but will recheck CBC as this could have been a lab error, hemoglobin was WNL year ago.

## 2022-09-15 NOTE — Assessment & Plan Note (Addendum)
BP still elevated today, will increase HCTZ, patient advised to take it at nighttime and as it seems to make him feel sleepy and when he takes it in the morning.  Hopefully this has not caused him to get up and urinate at night.  Will reassess at next visit and if needed can switch to a different medication. -HCTZ 25 mg -BMP

## 2022-09-15 NOTE — Assessment & Plan Note (Signed)
Patient advised that due to the holidays and may take longer to get the Zio patch.  If he does not receive it in 1 week, he should call the office or send a MyChart message to let us know and I will look into it

## 2022-09-17 ENCOUNTER — Encounter: Payer: Self-pay | Admitting: Student

## 2022-10-09 ENCOUNTER — Ambulatory Visit (HOSPITAL_COMMUNITY)
Admission: RE | Admit: 2022-10-09 | Discharge: 2022-10-09 | Disposition: A | Discharge status: Home / Self Care | Payer: PPO | Source: Ambulatory Visit | Attending: Family Medicine | Admitting: Family Medicine

## 2022-10-09 ENCOUNTER — Ambulatory Visit (INDEPENDENT_AMBULATORY_CARE_PROVIDER_SITE_OTHER): Payer: PPO | Admitting: Student

## 2022-10-09 ENCOUNTER — Encounter: Payer: Self-pay | Admitting: Student

## 2022-10-09 VITALS — BP 142/78 | HR 72 | Ht 72.0 in | Wt 130.0 lb

## 2022-10-09 DIAGNOSIS — R42 Dizziness and giddiness: Secondary | ICD-10-CM

## 2022-10-09 DIAGNOSIS — R202 Paresthesia of skin: Secondary | ICD-10-CM

## 2022-10-09 DIAGNOSIS — I480 Paroxysmal atrial fibrillation: Secondary | ICD-10-CM

## 2022-10-09 DIAGNOSIS — I499 Cardiac arrhythmia, unspecified: Secondary | ICD-10-CM | POA: Diagnosis not present

## 2022-10-09 DIAGNOSIS — I159 Secondary hypertension, unspecified: Secondary | ICD-10-CM | POA: Diagnosis not present

## 2022-10-09 DIAGNOSIS — G2581 Restless legs syndrome: Secondary | ICD-10-CM

## 2022-10-09 MED ORDER — METOPROLOL SUCCINATE ER 50 MG PO TB24: 50.0000 mg | ORAL_TABLET | Freq: Every day | ORAL | 3 refills | Status: DC

## 2022-10-09 MED ORDER — GABAPENTIN 600 MG PO TABS: 600.0000 mg | ORAL_TABLET | Freq: Every day | ORAL | 0 refills | Status: DC

## 2022-10-09 NOTE — Assessment & Plan Note (Signed)
It is possible patient's episodes of dizziness along with racing heart could be related to paroxysmal A-fib.  Upon auscultation, I did hear an irregular heart beat although did not sound like RVR.  Could have been PVC's or even a sinus arrhythmia.  EKG in office showed NSR.  I do agree with previous plan of getting the patient a Zio patch.  I will look into this and find out why he has not been contacted about it as it was previously ordered.  I will also look into history of PAF and look into why patient is not currently on a blood thinner.  We will discuss further at next visit in 1 to 2 weeks.

## 2022-10-09 NOTE — Patient Instructions (Signed)
It was great to see you! Thank you for allowing me to participate in your care!  Our plans for today:  -I prescribed you another medication to lower your blood pressure and also regulate your heart rate called metoprolol.  Please take this once daily at bedtime.  Take this in addition to the hydrochlorothiazide. -Please return for a follow-up visit in 1 to 2 weeks.  In the meantime, I will look into ordering you the Zio patch. -If you begin to have chest pain, shortness of breath, or severe headache, please go to the emergency department.   Take care and seek immediate care sooner if you develop any concerns.   Dr. Erick Alley, DO [facility]

## 2022-10-09 NOTE — Assessment & Plan Note (Signed)
Patient's blood pressure did improve however still over 140 systolic in office and normally over 140 systolic at home.  Will add metoprolol succinate 50 mg daily in addition to hydrochlorothiazide 25 mg daily.  I chose metoprolol due to patient's history of paroxysmal A-fib.  Patient advised to return in 1 to 2 weeks for follow-up visit.

## 2022-10-09 NOTE — Progress Notes (Signed)
    SUBJECTIVE:   CHIEF COMPLAINT / HPI:   HTN Patient last seen on 09/14/2022, HCTZ increased to 25 mg which he has been taking daily. Has been checking BP at home every couple days, systolic between 227'J-007'N but usually in 140's.   Dizziness  Still having intermittent light headedness. Was never called about the Zio patch. Still feels like his heart rate is fast when having the episodes. They last between 10 - 30 minutes and resolve with rest. The episodes occur several times a week.   PERTINENT  PMH / PSH: PAF, HTN, prediabetes, COPD  OBJECTIVE:   Vitals:   10/09/22 1357 10/09/22 1436  BP: (!) 170/82 (!) 142/78  Pulse: 72   SpO2: 97%      General: NAD, pleasant, able to participate in exam Cardiac: Regular rate, irregular rhythm Respiratory: CTAB, normal effort, No wheezes, rales or rhonchi Skin: warm and dry, no rashes noted Neuro: alert, no obvious focal deficits Psych: Normal affect and mood  ASSESSMENT/PLAN:   HTN (hypertension) Patient's blood pressure did improve however still over 140 systolic in office and normally over 140 systolic at home.  Will add metoprolol succinate 50 mg daily in addition to hydrochlorothiazide 25 mg daily.  I chose metoprolol due to patient's history of paroxysmal A-fib.  Patient advised to return in 1 to 2 weeks for follow-up visit.  Dizziness It is possible patient's episodes of dizziness along with racing heart could be related to paroxysmal A-fib.  Upon auscultation, I did hear an irregular heart beat although did not sound like RVR.  Could have been PVC's or even a sinus arrhythmia.  EKG in office showed NSR.  I do agree with previous plan of getting the patient a Zio patch.  I will look into this and find out why he has not been contacted about it as it was previously ordered.  I will also look into history of PAF and look into why patient is not currently on a blood thinner.  We will discuss further at next visit in 1 to 2 weeks.      Dr. Erick Alley, DO  [facility]

## 2022-10-11 ENCOUNTER — Ambulatory Visit (HOSPITAL_COMMUNITY): Payer: PPO

## 2022-10-29 NOTE — Progress Notes (Unsigned)
    SUBJECTIVE:   CHIEF COMPLAINT / HPI:   HTN At last visit patient was started on metoprolol succinate 50 mg daily in addition to HCTZ 25 mg daily.  Dizziness   PERTINENT  PMH / PSH: ***  OBJECTIVE:   There were no vitals taken for this visit. ***  General: NAD, pleasant, able to participate in exam Cardiac: RRR, no murmurs. Respiratory: CTAB, normal effort, No wheezes, rales or rhonchi Abdomen: Bowel sounds present, nontender, nondistended, no hepatosplenomegaly. Extremities: no edema or cyanosis. Skin: warm and dry, no rashes noted Neuro: alert, no obvious focal deficits Psych: Normal affect and mood  ASSESSMENT/PLAN:   No problem-specific Assessment & Plan notes found for this encounter.    I suspect it is possible he could be experiencing A-fib during these episodes as he describes racing heart/palpitations.  Last known to have A-fib in 2019 after lobectomy.  A-fib was thought to be related to surgery and he was not continued on medications.  Dr. Precious Gilding, [place]    {    This will disappear when note is signed, click to select method of visit    :1}

## 2022-10-30 ENCOUNTER — Ambulatory Visit (HOSPITAL_COMMUNITY): Payer: PPO | Attending: Family Medicine

## 2022-10-30 ENCOUNTER — Ambulatory Visit (HOSPITAL_COMMUNITY)
Admission: RE | Admit: 2022-10-30 | Discharge: 2022-10-30 | Disposition: A | Discharge status: Home / Self Care | Payer: PPO | Source: Ambulatory Visit | Attending: Family Medicine | Admitting: Family Medicine

## 2022-10-30 ENCOUNTER — Encounter: Payer: Self-pay | Admitting: Student

## 2022-10-30 ENCOUNTER — Ambulatory Visit (INDEPENDENT_AMBULATORY_CARE_PROVIDER_SITE_OTHER): Payer: PPO | Admitting: Student

## 2022-10-30 ENCOUNTER — Ambulatory Visit (HOSPITAL_COMMUNITY): Admission: RE | Admit: 2022-10-30 | Payer: PPO | Source: Ambulatory Visit

## 2022-10-30 VITALS — BP 173/97 | HR 64 | Ht 72.0 in | Wt 134.4 lb

## 2022-10-30 DIAGNOSIS — Z23 Encounter for immunization: Secondary | ICD-10-CM

## 2022-10-30 DIAGNOSIS — R079 Chest pain, unspecified: Secondary | ICD-10-CM | POA: Diagnosis not present

## 2022-10-30 DIAGNOSIS — R42 Dizziness and giddiness: Secondary | ICD-10-CM

## 2022-10-30 DIAGNOSIS — I10 Essential (primary) hypertension: Secondary | ICD-10-CM

## 2022-10-30 MED ORDER — PANTOPRAZOLE SODIUM 40 MG PO TBEC: 40.0000 mg | DELAYED_RELEASE_TABLET | Freq: Every day | ORAL | 0 refills | Status: DC

## 2022-10-30 MED ORDER — AMLODIPINE BESYLATE 5 MG PO TABS: 5.0000 mg | ORAL_TABLET | Freq: Every day | ORAL | 3 refills | Status: DC

## 2022-10-30 NOTE — Assessment & Plan Note (Signed)
I suspect it is possible he could be experiencing A-fib during these episodes as he describes racing heart/palpitations.  Last known to have A-fib in 2019 after lobectomy.  A-fib was thought to be related to surgery and he was not continued on medications.  -Zio monitor reordered

## 2022-10-30 NOTE — Patient Instructions (Addendum)
It was great to see you! Thank you for allowing me to participate in your care!  I recommend that you always bring your medications to each appointment as this makes it easy to ensure you are on the correct medications and helps [place] not miss when refills are needed.  Our plans for today:  -I have prescribed you a new medication for blood pressure called amlodipine.  Take this in addition to the metoprolol and hydrochlorothiazide.  Continue to check your blood pressures daily.  It will take up to a week for the amlodipine to take effect.  Our goal is for your blood pressure to be below 140/90. -I sent in a prescription for an acid reflux medication called Protonix.  Please take this daily to see if it helps relieve your chest pain. -I sent in a new order for the Zio patch to monitor your heart.  You will be contacted to get this set up. -I will look into what has previously been done for your shoulder pain and we can discuss further at the next visit if desired. -If your chest pain worsens, if you develop shortness of breath, nausea, vomiting, dizziness at the same time as the chest pain please go to the emergency department for further evaluation -Return in 2 weeks for follow-up visit   Take care and seek immediate care sooner if you develop any concerns.   Dr. Precious Gilding, DO [facility]

## 2022-10-30 NOTE — Progress Notes (Unsigned)
Enrolled patient for a 14 day Zio XT  monitor to be mailed to patients home  °

## 2022-10-30 NOTE — Assessment & Plan Note (Signed)
Uncontrolled on metoprolol and HCTZ, will add amlodipine 5 mg today -Return in 2 weeks for follow-up

## 2022-11-01 DIAGNOSIS — R079 Chest pain, unspecified: Secondary | ICD-10-CM | POA: Insufficient documentation

## 2022-11-01 DIAGNOSIS — R42 Dizziness and giddiness: Secondary | ICD-10-CM

## 2022-11-01 NOTE — Assessment & Plan Note (Addendum)
Reassuring chest pain is recent producible on palpation of left chest wall, shoulder pain reproducible with motion.  Patient also feels 1 aspect of chest pain is likely related to heartburn.  EKG negative for MI.  Prescription for Protonix discussed but he states OTC Prilosec has been working well for him. -Patient agrees with cardiology referral today d/t combination of chest pain and dizziness with palpitations -CTM -ED precautions given

## 2022-11-15 NOTE — Progress Notes (Signed)
    SUBJECTIVE:   CHIEF COMPLAINT / HPI:   HTN Patient is currently taking HCTZ, and amlodipine 5 mg was added at last visit.  Metoprolol was added a few visits ago although today patient states he does not think he is currently taking this, unsure if he ever picked it up from the pharmacy.  Also states he does not need anyone to help him remember how to take his medications, he feels he is capable of doing this appropriately.  Home Bps 0000000 systolic.   Dizziness Ongoing issue for the past year.  It is possible that patient is experiencing episodes of A-fib causing dizzy episodes as he also has heart palpitations with these and was known to have A-fib in 2019.  He did receive ZIO monitor since last visit and returned it. He was also referred to cardiology at last visit because he was having some chest pain, EKG was negative for MI and CP has since resolved.  Today he states has an apt with cards on 12/08/2022.   PERTINENT  PMH / PSH: HTN, T2DM, PAF  OBJECTIVE:   BP 137/80   Pulse 82   Ht 6' (1.829 m)   Wt 130 lb 6.4 oz (59.1 kg)   SpO2 100%   BMI 17.69 kg/m    General: NAD, pleasant, able to participate in exam Cardiac: Well-perfused Respiratory: Breathing comfortably on room air Skin: warm and dry Neuro: alert, no obvious focal deficits Psych: Normal affect and mood  ASSESSMENT/PLAN:   HTN (hypertension) BP at goal today.  Patient knows he is at least taking hydrochlorothiazide and amlodipine which he will continue on.  He will go home and look at his medications to ensure that he is not currently taking metoprolol.  He plans to return in 2 weeks and will bring his medications with him.  Dizziness Will be on the look out for results from ZIO monitor.  Patient advised to continue with the plan to see cardiology.  Again, it is possible he is having PAF causing the episodes of dizziness which are also accompanied by palpitations.     Dr. Precious Gilding, [place]

## 2022-11-16 ENCOUNTER — Ambulatory Visit: Payer: PPO | Admitting: Cardiology

## 2022-11-16 ENCOUNTER — Ambulatory Visit (INDEPENDENT_AMBULATORY_CARE_PROVIDER_SITE_OTHER): Payer: PPO | Admitting: Student

## 2022-11-16 ENCOUNTER — Encounter: Payer: Self-pay | Admitting: Student

## 2022-11-16 VITALS — BP 137/80 | HR 82 | Ht 72.0 in | Wt 130.4 lb

## 2022-11-16 DIAGNOSIS — R42 Dizziness and giddiness: Secondary | ICD-10-CM

## 2022-11-16 DIAGNOSIS — I159 Secondary hypertension, unspecified: Secondary | ICD-10-CM | POA: Diagnosis not present

## 2022-11-16 NOTE — Assessment & Plan Note (Addendum)
BP at goal today.  Patient knows he is at least taking hydrochlorothiazide and amlodipine which he will continue on.  He will go home and look at his medications to ensure that he is not currently taking metoprolol.  He plans to return in 2 weeks and will bring his medications with him.  Will check BMP today.

## 2022-11-16 NOTE — Patient Instructions (Signed)
It was great to see you! Thank you for allowing me to participate in your care!  I recommend that you always bring your medications to each appointment as this makes it easy to ensure you are on the correct medications and helps [place] not miss when refills are needed.  Our plans for today:  - Either return in the next week or two OR call me or send my chart message with a list of your medications that you are taking - Continue to check your BP at home. If it is consistently above 140/90, return for further management.  -I will be in touch with the results from the heart monitor. -Continue with plan to go to your cardiology apt.   We are checking some labs today, I will call you if they are abnormal will send you a MyChart message or a letter if they are normal.  If you do not hear about your labs in the next 2 weeks please let us know.  Take care and seek immediate care sooner if you develop any concerns.   Dr. Precious Gilding, DO [facility]

## 2022-11-16 NOTE — Assessment & Plan Note (Signed)
Will be on the look out for results from ZIO monitor.  Patient advised to continue with the plan to see cardiology.  Again, it is possible he is having PAF causing the episodes of dizziness which are also accompanied by palpitations.

## 2022-11-17 LAB — BASIC METABOLIC PANEL
BUN/Creatinine Ratio: 13 (ref 10–24)
BUN: 17 mg/dL (ref 8–27)
CO2: 26 mmol/L (ref 20–29)
Calcium: 10 mg/dL (ref 8.6–10.2)
Chloride: 99 mmol/L (ref 96–106)
Creatinine, Ser: 1.29 mg/dL — ABNORMAL HIGH (ref 0.76–1.27)
Glucose: 108 mg/dL — ABNORMAL HIGH (ref 70–99)
Potassium: 4.2 mmol/L (ref 3.5–5.2)
Sodium: 142 mmol/L (ref 134–144)
eGFR: 61 mL/min/{1.73_m2} (ref >59)

## 2022-11-19 ENCOUNTER — Telehealth: Payer: Self-pay | Admitting: Student

## 2022-11-19 NOTE — Telephone Encounter (Signed)
Spoke with patient on the phone regarding recent BMP.  Creatinine elevated 1.29 with baseline appearing to be closer to 1.  Was started on HCTZ a couple months ago which could contribute to increased creatinine.  Advised patient that we will continue to monitor this to see if it comes back down.

## 2022-11-27 ENCOUNTER — Telehealth: Payer: Self-pay | Admitting: *Deleted

## 2022-11-27 NOTE — Telephone Encounter (Signed)
Attempted to reach patient by phone. No answer and no VM is set up on mobile number.  Home number was not in service.  Unable to reach patient by phone.  I will send a mychart message.

## 2022-11-27 NOTE — Telephone Encounter (Signed)
-----   Message from Christopher D McAlhany, MD sent at 11/27/2022  2:09 PM EDT ----- New onset atrial flutter and frequent SVT on DOD monitor. He has an appt with Hochrein on 12/06/22 but we need to move this up with a DOD this week if possible unless [facility] has openings this week. Thanks, chris 

## 2022-11-27 NOTE — Telephone Encounter (Signed)
-----   Message from Burnell Blanks, MD sent at 11/27/2022  2:09 PM EDT ----- New onset atrial flutter and frequent SVT on DOD monitor. He has an appt with Hochrein on 12/06/22 but we need to move this up with a DOD this week if possible unless [facility] has openings this week. Thanks, chris

## 2022-11-28 ENCOUNTER — Ambulatory Visit (INDEPENDENT_AMBULATORY_CARE_PROVIDER_SITE_OTHER): Payer: PPO | Admitting: Student

## 2022-11-28 ENCOUNTER — Encounter: Payer: Self-pay | Admitting: Student

## 2022-11-28 VITALS — BP 150/93 | HR 65 | Ht 72.0 in | Wt 134.6 lb

## 2022-11-28 DIAGNOSIS — I4892 Unspecified atrial flutter: Secondary | ICD-10-CM | POA: Insufficient documentation

## 2022-11-28 DIAGNOSIS — I471 Supraventricular tachycardia, unspecified: Secondary | ICD-10-CM | POA: Diagnosis not present

## 2022-11-28 DIAGNOSIS — I10 Essential (primary) hypertension: Secondary | ICD-10-CM | POA: Diagnosis not present

## 2022-11-28 MED ORDER — AMLODIPINE BESYLATE 10 MG PO TABS: 10.0000 mg | ORAL_TABLET | Freq: Every day | ORAL | 3 refills | Status: DC

## 2022-11-28 MED ORDER — APIXABAN 5 MG PO TABS: 5.0000 mg | ORAL_TABLET | Freq: Two times a day (BID) | ORAL | 3 refills | Status: DC

## 2022-11-28 NOTE — Assessment & Plan Note (Signed)
Recent ZIO monitor confirmed atrial flutter along with paroxysmal SVT.  We had a discussion on risks and benefits of starting Eliquis, shared decision making used and patient will start Eliquis today.  He may need better rate control due to recurrent episodes of a flutter and SVT but I would not increase metoprolol at this time as his heart rate has been in the 50s on multiple occasions in the recent past.  Patient was referred to cardiology previously and has an appointment scheduled for later this month.  -Eliquis 5 mg twice daily -Metoprolol 50 mg daily -CBC -CMP -Echocardiogram ordered

## 2022-11-28 NOTE — Patient Instructions (Addendum)
It was great to see you! Thank you for allowing me to participate in your care!  I recommend that you always bring your medications to each appointment as this makes it easy to ensure you are on the correct medications and helps [place] not miss when refills are needed.  Our plans for today:  - I have increased your Amlodipine to 10 mg daily - I have started you on Eliquis 5 mg twice daily. This is a blood thinner to prevent clots/stroke which you are at risk for due to atrial flutter.It increases your risk of bleeding as we discussed so be careful to reduced the risk on injuries and falls. If you notice bleeding (including in bowel movements), make an appointment or seek emergent care if needed.  - Continue all your other medications - Go to your cardiology visit at the end of the month -Go to your heart ultrasound appointment (echocardiogram) - check your blood pressure at home and bring recordings in 2 weeks    We are checking some labs today, I will call you if they are abnormal will send you a MyChart message or a letter if they are normal.  If you do not hear about your labs in the next 2 weeks please let us know.  Take care and seek immediate care sooner if you develop any concerns.   Dr. Precious Gilding, DO [facility]

## 2022-11-28 NOTE — Progress Notes (Unsigned)
    SUBJECTIVE:   CHIEF COMPLAINT / HPI:   Dizziness Patient has been ordered a ZIO monitor to wear due to episodes of dizziness accompanied by palpitations.    ZIO monitor results are back and show:  -5221 episodes of superior ventricular tachycardia runs, fastest interval with rate of 250 bpm lasting 5.8 seconds and longest lasting 1 minute 37 seconds at ~173 bpm.  Patient was symptomatic during runs of supraventricular tachycardia. -1 run of Ventricular Tachycardia occurred lasting 5 beats.  -Atrial flutter (2% burden) with RVR, longest episode lasting 35 minutes 52 seconds with average rate of 149 bpm.  Today he states he is not having the dizzy spells as frequently, still occurring a couple times a week.   HTN Currently taking metoprolol 50 mg a bedtime, amlodipine 5 mg, HCTZ 25 mg He is checking his BP at home, 130's-150's/70's-90's   PERTINENT  PMH / PSH: ***  OBJECTIVE:   BP (!) 164/86   Pulse (!) 53   Ht 6' (1.829 m)   Wt 134 lb 9.6 oz (61.1 kg)   SpO2 99%   BMI 18.26 kg/m    General: NAD, pleasant, able to participate in exam Cardiac: RRR, no murmurs. Respiratory: CTAB, normal effort, No wheezes, rales or rhonchi Abdomen: Bowel sounds present, nontender, nondistended, no hepatosplenomegaly. Extremities: no edema or cyanosis. Skin: warm and dry, no rashes noted Neuro: alert, no obvious focal deficits Psych: Normal affect and mood  ASSESSMENT/PLAN:   Atrial flutter (HCC) Recent ZIO monitor confirmed atrial flutter      Dr. Precious Gilding, [place]    {    This will disappear when note is signed, click to select method of visit    :1}

## 2022-11-29 LAB — COMPREHENSIVE METABOLIC PANEL
ALT: 18 IU/L (ref 0–44)
AST: 28 IU/L (ref 0–40)
Albumin/Globulin Ratio: 1.4 (ref 1.2–2.2)
Albumin: 4.3 g/dL (ref 3.9–4.9)
Alkaline Phosphatase: 78 IU/L (ref 44–121)
BUN/Creatinine Ratio: 9 — ABNORMAL LOW (ref 10–24)
BUN: 10 mg/dL (ref 8–27)
Bilirubin Total: 0.3 mg/dL (ref 0.0–1.2)
CO2: 27 mmol/L (ref 20–29)
Calcium: 9.8 mg/dL (ref 8.6–10.2)
Chloride: 96 mmol/L (ref 96–106)
Creatinine, Ser: 1.07 mg/dL (ref 0.76–1.27)
Globulin, Total: 3 g/dL (ref 1.5–4.5)
Glucose: 100 mg/dL — ABNORMAL HIGH (ref 70–99)
Potassium: 3.9 mmol/L (ref 3.5–5.2)
Sodium: 139 mmol/L (ref 134–144)
Total Protein: 7.3 g/dL (ref 6.0–8.5)
eGFR: 76 mL/min/{1.73_m2} (ref >59)

## 2022-11-29 LAB — CBC
Hematocrit: 48 % (ref 37.5–51.0)
Hemoglobin: 15.9 g/dL (ref 13.0–17.7)
MCH: 28.4 pg (ref 26.6–33.0)
MCHC: 33.1 g/dL (ref 31.5–35.7)
MCV: 86 fL (ref 79–97)
Platelets: 214 10*3/uL (ref 150–450)
RBC: 5.59 x10E6/uL (ref 4.14–5.80)
RDW: 13.4 % (ref 11.6–15.4)
WBC: 6.9 10*3/uL (ref 3.4–10.8)

## 2022-11-29 NOTE — Assessment & Plan Note (Signed)
Elevated in office today and home readings have been in [id number] systolic.  Will increase amlodipine to 10 mg daily and he should continue on metoprolol and HCTZ.  Return in 2 weeks for follow-up.

## 2022-11-29 NOTE — Assessment & Plan Note (Signed)
Continue metoprolol 50 mg daily.  Follow-up with cardiology later this month.

## 2022-11-29 NOTE — Telephone Encounter (Signed)
Reviewing chart.  Pt was seen in family medicine yesterday and started on Eliquis.  Will plan for patient to keep scheduled appointment with Dr. Percival Spanish next week.

## 2022-12-05 NOTE — Progress Notes (Deleted)
Cardiology Office Note   Date:  12/05/2022   ID:  Name, Seese January 06, 1956, MRN [id number]  PCP:  Precious Gilding, DO  Cardiologist:   Minus Breeding, MD Referring:  ***  No chief complaint on file.     History of Present Illness: ANTOWAN VINYARD is a 67 y.o. male who presents for evaluation of atrial fibrillation.  He is referred by Precious Gilding, DO.  He complained of dizziness recently and had a Zio patch placed.  He did have episodes of atrial flutter with RVR lasting for over 30 minutes.  He was started on anticoagulation.  ***   I did see him in 2019 for evaluation paroxysmal atrial fibrillation.  This was identified at the time of her thoracotomy he was treated with IV amiodarone.  Past Medical History:  Diagnosis Date   Anxiety state 03/08/2018   Bursitis of left shoulder    Essential hypertension 01/31/2018   In 2019, has not been on medication since this time.   GERD (gastroesophageal reflux disease)    Hyperlipidemia    Restless leg syndrome    Squamous cell lung cancer [facility])     Past Surgical History:  Procedure Laterality Date   CIRCUMCISION  12/13/2003   COLONOSCOPY     VIDEO ASSISTED THORACOSCOPY (VATS)/ LOBECTOMY Left 01/17/2018   Procedure: left VIDEO ASSISTED THORACOSCOPY (VATS) with left lower lobe wedge resection and LOBECTOMY;  Surgeon: Ivin Poot, MD;  Location: [place] OR;  Service: Thoracic;  Laterality: Left;     Current Outpatient Medications  Medication Sig Dispense Refill   amLODipine (NORVASC) 10 MG tablet Take 1 tablet (10 mg total) by mouth at bedtime. 90 tablet 3   apixaban (ELIQUIS) 5 MG TABS tablet Take 1 tablet (5 mg total) by mouth 2 (two) times daily. 180 tablet 3   atorvastatin (LIPITOR) 40 MG tablet Take 1 tablet (40 mg total) by mouth daily. 90 tablet 3   betamethasone valerate ointment (VALISONE) 0.1 % Apply 1 application topically 2 (two) times daily. 30 g 0   gabapentin (NEURONTIN) 600 MG tablet Take 1 tablet (600 mg total) by  mouth at bedtime. 90 tablet 0   hydrochlorothiazide (HYDRODIURIL) 25 MG tablet Take 1 tablet (25 mg total) by mouth daily. 90 tablet 3   metoprolol succinate (TOPROL-XL) 50 MG 24 hr tablet Take 1 tablet (50 mg total) by mouth at bedtime. Take with or immediately following a meal. 90 tablet 3   Multiple Vitamin (MULTIVITAMIN) tablet Take 1 tablet by mouth daily.     rOPINIRole (REQUIP) 2 MG tablet Take 0.5 tablets (1 mg total) by mouth at bedtime. (Patient not taking: Reported on 09/14/2022) 30 tablet 1   No current facility-administered medications for this visit.    Allergies:   Lactose intolerance (gi)    Social History:  The patient  reports that he has been smoking cigarettes. He started smoking about 48 years ago. He has a 4.20 pack-year smoking history. He has never used smokeless tobacco. He reports that he does not currently use alcohol. He reports current drug use. Frequency: 7.00 times per week. Drug: Marijuana.   Family History:  The patient's ***family history includes Arthritis in an other family member; Colon cancer (age of onset: 45) in his father; Esophageal cancer in his brother.    ROS:  Please see the history of present illness.   Otherwise, review of systems are positive for {NONE DEFAULTED:18576}.   All other systems are reviewed and negative.  PHYSICAL EXAM: VS:  There were no vitals taken for this visit. , BMI There is no height or weight on file to calculate BMI. GENERAL:  Well appearing HEENT:  Pupils equal round and reactive, fundi not visualized, oral mucosa unremarkable NECK:  No jugular venous distention, waveform within normal limits, carotid upstroke brisk and symmetric, no bruits, no thyromegaly LYMPHATICS:  No cervical, inguinal adenopathy LUNGS:  Clear to auscultation bilaterally BACK:  No CVA tenderness CHEST:  Unremarkable HEART:  PMI not displaced or sustained,S1 and S2 within normal limits, no S3, no S4, no clicks, no rubs, *** murmurs ABD:  Flat,  positive bowel sounds normal in frequency in pitch, no bruits, no rebound, no guarding, no midline pulsatile mass, no hepatomegaly, no splenomegaly EXT:  2 plus pulses throughout, no edema, no cyanosis no clubbing SKIN:  No rashes no nodules NEURO:  Cranial nerves II through XII grossly intact, motor grossly intact throughout PSYCH:  Cognitively intact, oriented to person place and time    EKG:  EKG {ACTION; IS/IS GI:[id number] ordered today. The ekg ordered today demonstrates ***   Recent Labs: 09/14/2022: TSH 0.887 11/28/2022: ALT 18; BUN 10; Creatinine, Ser 1.07; Hemoglobin 15.9; Platelets 214; Potassium 3.9; Sodium 139    Lipid Panel    Component Value Date/Time   CHOL 187 03/22/2020 1708   TRIG 119 03/22/2020 1708   HDL 50 03/22/2020 1708   CHOLHDL 3.7 03/22/2020 1708   CHOLHDL 3.9 04/18/2010 0545   VLDL 12 04/18/2010 0545   LDLCALC 116 (H) 03/22/2020 1708      Wt Readings from Last 3 Encounters:  11/28/22 134 lb 9.6 oz (61.1 kg)  11/16/22 130 lb 6.4 oz (59.1 kg)  10/30/22 134 lb 6 oz (61 kg)      Other studies Reviewed: Additional studies/ records that were reviewed today include: ***. Review of the above records demonstrates:  Please see elsewhere in the note.  ***   ASSESSMENT AND PLAN:  Atrial fibrillation:***  Hypertension:  ***  Dizziness: ***   Current medicines are reviewed at length with the patient today.  The patient {ACTIONS; HAS/DOES NOT HAVE:19233} concerns regarding medicines.  The following changes have been made:  {PLAN; NO CHANGE:13088:s}  Labs/ tests ordered today include: *** No orders of the defined types were placed in this encounter.    Disposition:   FU with ***    Signed, Minus Breeding, MD  12/05/2022 3:53 PM    [place]

## 2022-12-06 ENCOUNTER — Ambulatory Visit: Payer: PPO | Admitting: Cardiology

## 2022-12-06 DIAGNOSIS — I10 Essential (primary) hypertension: Secondary | ICD-10-CM

## 2022-12-06 DIAGNOSIS — I480 Paroxysmal atrial fibrillation: Secondary | ICD-10-CM

## 2022-12-06 DIAGNOSIS — R42 Dizziness and giddiness: Secondary | ICD-10-CM

## 2022-12-12 ENCOUNTER — Encounter: Payer: Self-pay | Admitting: Student

## 2022-12-12 ENCOUNTER — Other Ambulatory Visit: Payer: Self-pay

## 2022-12-12 ENCOUNTER — Ambulatory Visit (INDEPENDENT_AMBULATORY_CARE_PROVIDER_SITE_OTHER): Payer: PPO | Admitting: Student

## 2022-12-12 VITALS — BP 149/96 | HR 84 | Ht 72.0 in | Wt 131.0 lb

## 2022-12-12 DIAGNOSIS — I4892 Unspecified atrial flutter: Secondary | ICD-10-CM

## 2022-12-12 DIAGNOSIS — I10 Essential (primary) hypertension: Secondary | ICD-10-CM | POA: Diagnosis not present

## 2022-12-12 DIAGNOSIS — I471 Supraventricular tachycardia, unspecified: Secondary | ICD-10-CM | POA: Diagnosis not present

## 2022-12-12 MED ORDER — AMLODIPINE-OLMESARTAN 10-20 MG PO TABS: 1.0000 | ORAL_TABLET | Freq: Every day | ORAL | 0 refills | Status: DC

## 2022-12-12 NOTE — Assessment & Plan Note (Signed)
Uncontrolled.  Patient advised to stop taking amlodipine 10 mg daily and will instead add a combo pill, Azor 10-20 mg in addition to metoprolol and HCTZ.  Patient advised to check and record BPs at home and return in 2 weeks for follow-up and BMP

## 2022-12-12 NOTE — Assessment & Plan Note (Signed)
Patient to follow up with cardiology on 12/21/2022 -Continue metoprolol and Eliquis -Will reach out to pharmacy team to see if we can get Eliquis for him at a decreased price

## 2022-12-12 NOTE — Assessment & Plan Note (Signed)
Follow-up with cardiology -Continue metoprolol

## 2022-12-12 NOTE — Progress Notes (Signed)
    SUBJECTIVE:   CHIEF COMPLAINT / HPI:   A flutter  SVT Seen on zio monitor. Pt started on Eliquis earlier this month which he is tolerating well and still taking metoprolol 50 mg daily. The eliquis is $94 for 3 month supply which is diffcult for him to afford.  Pt was scheduled with cardiology on 12/06/22 but he missed this appointment and rescheduled for 12/21/2022.  He is having less dizzy episodes now. Only had one over the past two weeks.   He recently quit smoking black and milds a month ago.  He does drink alcohol on occasion (such as celebrations), last drink was 5 months ago.   HTN Currently taking metoprolol 50 mg, amlodipine 10 mg (increased from 5 at last visit), HCTZ 25 mg.  Has been checking BP at home but did not bring with him.  States systolic has been in mostly 140s/150s.  PERTINENT  PMH / PSH: HTN, COPD, SVT, A-flutter   OBJECTIVE:   BP (!) 149/96   Pulse 84   Ht 6' (1.829 m)   Wt 131 lb (59.4 kg)   SpO2 100%   BMI 17.77 kg/m    General: NAD, pleasant, able to participate in exam Cardiac: RRR, no murmurs. Respiratory: CTAB, normal effort, No wheezes, rales or rhonchi Skin: warm and dry Neuro: alert, no obvious focal deficits Psych: Normal affect and mood  ASSESSMENT/PLAN:   Atrial flutter ([place]) Patient to follow up with cardiology on 12/21/2022 -Continue metoprolol and Eliquis -Will reach out to pharmacy team to see if we can get Eliquis for him at a decreased price  Paroxysmal supraventricular tachycardia Follow-up with cardiology -Continue metoprolol  HTN (hypertension) Uncontrolled.  Patient advised to stop taking amlodipine 10 mg daily and will instead add a combo pill, Azor 10-20 mg in addition to metoprolol and HCTZ.  Patient advised to check and record BPs at home and return in 2 weeks for follow-up and BMP     Dr. Precious Gilding, [place]

## 2022-12-12 NOTE — Patient Instructions (Signed)
It was great to see you! Thank you for allowing me to participate in your care!  I recommend that you always bring your medications to each appointment as this makes it easy to ensure you are on the correct medications and helps [place] not miss when refills are needed.  Our plans for today:  - for your blood pressure: STOP taking the amlodipine 10 mg. Instead, START taking Azor (a combination pill with amlodipine and olmesartan) -Continue to check BP at home and bring readings to next apt (goal is <140/90) -Return in 2 weeks for follow up : ) -GO TO YOUR CARDIOLOGY APPOINTMENT : ) - if you ever run our of Eliquis, call us, we have samples   Take care and seek immediate care sooner if you develop any concerns.   Dr. Precious Gilding, DO [facility]

## 2022-12-18 ENCOUNTER — Telehealth: Payer: Self-pay | Admitting: Student

## 2022-12-18 NOTE — Telephone Encounter (Signed)
Contacted Edward Qualia to schedule their annual wellness visit. Appointment made for 12/25/2022.  Thank you,  [place] Direct dial  [phone]

## 2022-12-21 ENCOUNTER — Telehealth: Payer: Self-pay | Admitting: Student

## 2022-12-21 ENCOUNTER — Ambulatory Visit (HOSPITAL_COMMUNITY)
Admission: RE | Admit: 2022-12-21 | Discharge: 2022-12-21 | Disposition: A | Discharge status: Home / Self Care | Payer: PPO | Source: Ambulatory Visit | Attending: Family Medicine | Admitting: Family Medicine

## 2022-12-21 ENCOUNTER — Other Ambulatory Visit (HOSPITAL_COMMUNITY): Payer: Self-pay

## 2022-12-21 DIAGNOSIS — I341 Nonrheumatic mitral (valve) prolapse: Secondary | ICD-10-CM | POA: Insufficient documentation

## 2022-12-21 DIAGNOSIS — I4892 Unspecified atrial flutter: Secondary | ICD-10-CM

## 2022-12-21 DIAGNOSIS — I10 Essential (primary) hypertension: Secondary | ICD-10-CM | POA: Insufficient documentation

## 2022-12-21 DIAGNOSIS — E785 Hyperlipidemia, unspecified: Secondary | ICD-10-CM | POA: Insufficient documentation

## 2022-12-21 LAB — ECHOCARDIOGRAM COMPLETE
AR max vel: 2.71 cm2
AV Area VTI: 2.75 cm2
AV Area mean vel: 2.43 cm2
AV Mean grad: 2 mmHg
AV Peak grad: 4.6 mmHg
Ao pk vel: 1.07 m/s
Area-P 1/2: 4.89 cm2
Calc EF: 37.5 %
S' Lateral: 2.8 cm
Single Plane A2C EF: 35.2 %
Single Plane A4C EF: 42.7 %

## 2022-12-21 NOTE — Telephone Encounter (Signed)
Called patient to discuss echocardiogram results showing EF 40-45% with mildly decreased function of left ventricle and global hypokinesis.  Patient has appointment with cardiologist on 4/16 which he plans to attend and we discussed the importance of not missing this appointment.  He initially had an appointment scheduled with me prior to his cardiology appointment.  We have agreed to cancel this appointment.  Patient will call to reschedule an appointment with me after the cardiology visit so that we can recap.  He also mentions one eye is red, watery, painful and has intermittent blurry vision. He thinks he has pink eye.  Advised patient not to wait until Monday to get this looked at in the office especially as he is having intermittent blurry vision.  I encouraged him to go seek care at other ED, urgent care, or ophthalmologist urgently.  Patient states he plans to go tomorrow around noon to an urgent care.

## 2022-12-24 NOTE — Patient Instructions (Signed)
Health Maintenance, Male Adopting a healthy lifestyle and getting preventive care are important in promoting health and wellness. Ask your health care provider about: The right schedule for you to have regular tests and exams. Things you can do on your own to prevent diseases and keep yourself healthy. What should I know about diet, weight, and exercise? Eat a healthy diet  Eat a diet that includes plenty of vegetables, fruits, low-fat dairy products, and lean protein. Do not eat a lot of foods that are high in solid fats, added sugars, or sodium. Maintain a healthy weight Body mass index (BMI) is a measurement that can be used to identify possible weight problems. It estimates body fat based on height and weight. Your health care provider can help determine your BMI and help you achieve or maintain a healthy weight. Get regular exercise Get regular exercise. This is one of the most important things you can do for your health. Most adults should: Exercise for at least 150 minutes each week. The exercise should increase your heart rate and make you sweat (moderate-intensity exercise). Do strengthening exercises at least twice a week. This is in addition to the moderate-intensity exercise. Spend less time sitting. Even light physical activity can be beneficial. Watch cholesterol and blood lipids Have your blood tested for lipids and cholesterol at 67 years of age, then have this test every 5 years. You may need to have your cholesterol levels checked more often if: Your lipid or cholesterol levels are high. You are older than 67 years of age. You are at high risk for heart disease. What should I know about cancer screening? Many types of cancers can be detected early and may often be prevented. Depending on your health history and family history, you may need to have cancer screening at various ages. This may include screening for: Colorectal cancer. Prostate cancer. Skin cancer. Lung  cancer. What should I know about heart disease, diabetes, and high blood pressure? Blood pressure and heart disease High blood pressure causes heart disease and increases the risk of stroke. This is more likely to develop in people who have high blood pressure readings or are overweight. Talk with your health care provider about your target blood pressure readings. Have your blood pressure checked: Every 3-5 years if you are 18-39 years of age. Every year if you are 40 years old or older. If you are between the ages of 65 and 75 and are a current or former smoker, ask your health care provider if you should have a one-time screening for abdominal aortic aneurysm (AAA). Diabetes Have regular diabetes screenings. This checks your fasting blood sugar level. Have the screening done: Once every three years after age 45 if you are at a normal weight and have a low risk for diabetes. More often and at a younger age if you are overweight or have a high risk for diabetes. What should I know about preventing infection? Hepatitis B If you have a higher risk for hepatitis B, you should be screened for this virus. Talk with your health care provider to find out if you are at risk for hepatitis B infection. Hepatitis C Blood testing is recommended for: Everyone born from 1945 through 1965. Anyone with known risk factors for hepatitis C. Sexually transmitted infections (STIs) You should be screened each year for STIs, including gonorrhea and chlamydia, if: You are sexually active and are younger than 67 years of age. You are older than 67 years of age and your   health care provider tells you that you are at risk for this type of infection. Your sexual activity has changed since you were last screened, and you are at increased risk for chlamydia or gonorrhea. Ask your health care provider if you are at risk. Ask your health care provider about whether you are at high risk for HIV. Your health care provider  may recommend a prescription medicine to help prevent HIV infection. If you choose to take medicine to prevent HIV, you should first get tested for HIV. You should then be tested every 3 months for as long as you are taking the medicine. Follow these instructions at home: Alcohol use Do not drink alcohol if your health care provider tells you not to drink. If you drink alcohol: Limit how much you have to 0-2 drinks a day. Know how much alcohol is in your drink. In the [place] one drink equals one 12 oz bottle of beer (355 mL), one 5 oz glass of wine (148 mL), or one 1 oz glass of hard liquor (44 mL). Lifestyle Do not use any products that contain nicotine or tobacco. These products include cigarettes, chewing tobacco, and vaping devices, such as e-cigarettes. If you need help quitting, ask your health care provider. Do not use street drugs. Do not share needles. Ask your health care provider for help if you need support or information about quitting drugs. General instructions Schedule regular health, dental, and eye exams. Stay current with your vaccines. Tell your health care provider if: You often feel depressed. You have ever been abused or do not feel safe at home. Summary Adopting a healthy lifestyle and getting preventive care are important in promoting health and wellness. Follow your health care provider's instructions about healthy diet, exercising, and getting tested or screened for diseases. Follow your health care provider's instructions on monitoring your cholesterol and blood pressure. This information is not intended to replace advice given to you by your health care provider. Make sure you discuss any questions you have with your health care provider. Document Revised: 01/23/2021 Document Reviewed: 01/23/2021 Elsevier Patient Education  2023 Elsevier Inc.  

## 2022-12-24 NOTE — Progress Notes (Signed)
I connected with  Curtis Henry on 12/25/2022 by a audio enabled telemedicine application and verified that I am speaking with the correct person using two identifiers.  Patient Location: Home  Provider Location: Home Office  I discussed the limitations of evaluation and management by telemedicine. The patient expressed understanding and agreed to proceed.   Subjective:   Curtis Henry is a 67 y.o. male who presents for an Initial Medicare Annual Wellness Visit.  Review of Systems     Per HPI unless specifically indicated below.  Cardiac Risk Factors include: advanced age (>5155men, 4>65 women);male gender, Hypertension, and Hypercholesterolemia.           Objective:       12/12/2022    8:58 AM 12/12/2022    8:40 AM 11/28/2022    9:00 AM  Vitals with BMI  Height  6\' 0"    Weight  131 lbs   BMI  17.76   Systolic 149 166 [id number]  Diastolic 96 86 93  Pulse  84 65    Today's Vitals   12/25/22 0821  PainSc: 0-No pain   There is no height or weight on file to calculate BMI.     12/12/2022    8:42 AM 11/28/2022    8:35 AM 11/16/2022    8:49 AM 10/30/2022    9:39 AM 10/09/2022    2:04 PM 09/14/2022    3:16 PM 09/05/2022   10:24 AM  Advanced Directives  Does Patient Have a Medical Advance Directive? No No No No No No No  Would patient like information on creating a medical advance directive? No - Patient declined No - Patient declined  No - Patient declined No - Patient declined No - Patient declined No - Patient declined    Current Medications (verified) Outpatient Encounter Medications as of 12/25/2022  Medication Sig   amlodipine-olmesartan (AZOR) 10-20 MG tablet Take 1 tablet by mouth daily.   apixaban (ELIQUIS) 5 MG TABS tablet Take 1 tablet (5 mg total) by mouth 2 (two) times daily.   atorvastatin (LIPITOR) 40 MG tablet Take 1 tablet (40 mg total) by mouth daily.   betamethasone valerate ointment (VALISONE) 0.1 % Apply 1 application topically 2 (two) times daily.    gabapentin (NEURONTIN) 600 MG tablet Take 1 tablet (600 mg total) by mouth at bedtime.   hydrochlorothiazide (HYDRODIURIL) 25 MG tablet Take 1 tablet (25 mg total) by mouth daily.   metoprolol succinate (TOPROL-XL) 50 MG 24 hr tablet Take 1 tablet (50 mg total) by mouth at bedtime. Take with or immediately following a meal.   Multiple Vitamin (MULTIVITAMIN) tablet Take 1 tablet by mouth daily.   omega-3 acid ethyl esters (LOVAZA) 1 g capsule Take by mouth 2 (two) times daily.   Omega-3 Fatty Acids (FISH OIL) 360 MG CAPS Take by mouth.   [DISCONTINUED] rOPINIRole (REQUIP) 2 MG tablet Take 0.5 tablets (1 mg total) by mouth at bedtime. (Patient not taking: Reported on 12/25/2022)   No facility-administered encounter medications on file as of 12/25/2022.    Allergies (verified) Lactose intolerance (gi)   History: Past Medical History:  Diagnosis Date   Anxiety state 03/08/2018   Bursitis of left shoulder    Essential hypertension 01/31/2018   In 2019, has not been on medication since this time.   GERD (gastroesophageal reflux disease)    Hyperlipidemia    Restless leg syndrome    Squamous cell lung cancer    Past Surgical History:  Procedure Laterality Date  CIRCUMCISION  12/13/2003   COLONOSCOPY     VIDEO ASSISTED THORACOSCOPY (VATS)/ LOBECTOMY Left 01/17/2018   Procedure: left VIDEO ASSISTED THORACOSCOPY (VATS) with left lower lobe wedge resection and LOBECTOMY;  Surgeon: Kerin Perna, MD;  Location: [facility] OR;  Service: Thoracic;  Laterality: Left;   Family History  Problem Relation Age of Onset   Esophageal cancer Brother    Colon cancer Father 9   Arthritis Other    Social History   Socioeconomic History   Marital status: Widowed    Spouse name: Not on file   Number of children: 5   Years of education: Not on file   Highest education level: Not on file  Occupational History   Occupation: [employer]  Tobacco Use   Smoking status: Former    Packs/day: 0.10    Years: 42.00     Additional pack years: 0.00    Total pack years: 4.20    Types: Cigarettes    Start date: 09/17/1974    Quit date: 2022    Years since quitting: 2.2   Smokeless tobacco: Never  Vaping Use   Vaping Use: Some days   Substances: Nicotine, Flavoring  Substance and Sexual Activity   Alcohol use: Not Currently   Drug use: Yes    Frequency: 7.0 times per week    Types: Marijuana    Comment: smokes marijuana; crack use distant past >30 yrs ago   Sexual activity: Not Currently  Other Topics Concern   Not on file  Social History Narrative   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    Social Determinants of Health   Financial Resource Strain: Medium Risk (12/25/2022)   Overall Financial Resource Strain (CARDIA)    Difficulty of Paying Living Expenses: Somewhat hard  Food Insecurity: Food Insecurity Present (12/25/2022)   Hunger Vital Sign    Worried About Running Out of Food in the Last Year: Sometimes true    Ran Out of Food in the Last Year: Sometimes true  Transportation Needs: No Transportation Needs (12/25/2022)   PRAPARE - Administrator, Civil Service (Medical): No    Lack of Transportation (Non-Medical): No  Physical Activity: Inactive (12/25/2022)   Exercise Vital Sign    Days of Exercise per Week: 0 days    Minutes of Exercise per Session: 0 min  Stress: Stress Concern Present (12/25/2022)   [employer] of Occupational Health - Occupational Stress Questionnaire    Feeling of Stress : To some extent  Social Connections: Socially Isolated (12/25/2022)   Social Connection and Isolation Panel [NHANES]    Frequency of Communication with Friends and Family: More than three times a week    Frequency of Social Gatherings with Friends and Family: More than three times a week    Attends Religious Services: Never    Database administrator or Organizations: No    Attends Banker Meetings: Never    Marital Status: Widowed    Tobacco Counseling Counseling  given: No   Clinical Intake:  Pre-visit preparation completed: No  Pain : No/denies pain Pain Score: 0-No pain     Diabetes: No  How often do you need to have someone help you when you read instructions, pamphlets, or other written materials from your doctor or pharmacy?: 1 - Never  Diabetic? No  Interpreter Needed?: No  Information entered by :: Laurel Dimmer, CMA   Activities of Daily Living    12/25/2022    8:18 AM  In your present state of health, do you have any difficulty performing the following activities:  Hearing? 0  Vision? 1  Comment [facility]  Difficulty concentrating or making decisions? 1  Walking or climbing stairs? 1  Dressing or bathing? 0  Doing errands, shopping? 0    Patient Care Team: Erick Alley, DO as PCP - General (Family Medicine) Rollene Rotunda, MD as PCP - Cardiology (Cardiology)  Indicate any recent Medical Services you may have received from other than [facility] providers in the past year (date may be approximate).     Assessment:   This is a routine wellness examination for [place].   Hearing/Vision screen Denies any hearing issues. Denies any change to her vision. Annual Eye Exam.  Dietary issues and exercise activities discussed: Current Exercise Habits: The patient has a physically strenuous job, but has no regular exercise apart from work., Exercise limited by: None identified   Goals Addressed   None    Depression Screen    12/25/2022    8:17 AM 12/12/2022    8:41 AM 11/28/2022    8:37 AM 11/16/2022   12:00 PM 11/16/2022    9:02 AM 10/30/2022   10:29 AM 10/09/2022    2:02 PM  PHQ 2/9 Scores  PHQ - 2 Score 3 3 3  0 2 0 0  PHQ- 9 Score  8 5 0 5 5 7     Fall Risk    12/25/2022    8:18 AM 12/12/2022    8:41 AM 11/28/2022    8:35 AM 11/16/2022   12:00 PM 10/09/2022    2:04 PM  Fall Risk   Falls in the past year? 1 0 0 0 0  Number falls in past yr: 0 0 0  0  Injury with Fall? 0 0 0  0  Follow up Falls evaluation completed         FALL RISK PREVENTION PERTAINING TO THE HOME:  Any stairs in or around the home? Yes  If so, are there any without handrails? No  Home free of loose throw rugs in walkways, pet beds, electrical cords, etc? No, 71 year old grandchild toys are all over. Adequate lighting in your home to reduce risk of falls? Yes   ASSISTIVE DEVICES UTILIZED TO PREVENT FALLS:  Life alert? No  Use of a cane, walker or w/c? No  Grab bars in the bathroom? No  Shower chair or bench in shower? No  Elevated toilet seat or a handicapped toilet? No   TIMED UP AND GO:  Was the test performed?Unable to perform, virtual appointment   Cognitive Function:        12/25/2022    8:20 AM  6CIT Screen  What Year? 0 points  What month? 0 points  What time? 0 points  Count back from 20 0 points  Months in reverse 4 points  Repeat phrase 0 points  Total Score 4 points    Immunizations Immunization History  Administered Date(s) Administered   COVID-19, mRNA, vaccine(Comirnaty)12 years and older 10/30/2022   Fluad Quad(high Dose 65+) 10/30/2022   PFIZER(Purple Top)SARS-COV-2 Vaccination 11/27/2019, 12/18/2019   Pfizer Covid-19 Vaccine Bivalent Booster 67yrs & up 10/10/2021   Td 11/16/2003   Tdap 12/12/2016    TDAP status: Up to date  Flu Vaccine status: Up to date  Pneumococcal vaccine status: Due, Education has been provided regarding the importance of this vaccine. Advised may receive this vaccine at local pharmacy or Health Dept. Aware to provide a copy  of the vaccination record if obtained from local pharmacy or Health Dept. Verbalized acceptance and understanding.  Covid-19 vaccine status: Completed vaccines  Qualifies for Shingles Vaccine? Yes   Zostavax completed No   Shingrix Completed?: No.    Education has been provided regarding the importance of this vaccine. Patient has been advised to call insurance company to determine out of pocket expense if they have not yet received this vaccine.  Advised may also receive vaccine at local pharmacy or Health Dept. Verbalized acceptance and understanding.  Screening Tests Health Maintenance  Topic Date Due   Pneumonia Vaccine 68+ Years old (1 of 2 - PCV) Never done   Zoster Vaccines- Shingrix (1 of 2) Never done   INFLUENZA VACCINE  04/18/2023   Medicare Annual Wellness (AWV)  12/25/2023   COLONOSCOPY (Pts 45-62yrs Insurance coverage will need to be confirmed)  06/18/2026   DTaP/Tdap/Td (3 - Td or Tdap) 12/13/2026   COVID-19 Vaccine  Completed   Hepatitis C Screening  Completed   HPV VACCINES  Aged Out    Health Maintenance  Health Maintenance Due  Topic Date Due   Pneumonia Vaccine 45+ Years old (1 of 2 - PCV) Never done   Zoster Vaccines- Shingrix (1 of 2) Never done    Colorectal cancer screening: Type of screening: Colonoscopy. Completed 06/18/2016. Repeat every 10 years  Lung Cancer Screening: (Low Dose CT Chest recommended if Age 39-80 years, 30 pack-year currently smoking OR have quit w/in 15years.) does not qualify.   Lung Cancer Screening Referral: not applicable   Additional Screening:  Hepatitis C Screening: does qualify; Completed 03/18/2017  Vision Screening: Recommended annual ophthalmology exams for early detection of glaucoma and other disorders of the eye. Is the patient up to date with their annual eye exam?  Yes  Who is the provider or what is the name of the office in which the patient attends annual eye exams? [facility]  If pt is not established with a provider, would they like to be referred to a provider to establish care? No .   Dental Screening: Recommended annual dental exams for proper oral hygiene  Community Resource Referral / Chronic Care Management: CRR required this visit?  No   CCM required this visit?  No      Plan:     I have personally reviewed and noted the following in the patient's chart:   Medical and social history Use of alcohol, tobacco or illicit  drugs  Current medications and supplements including opioid prescriptions. Patient is not currently taking opioid prescriptions. Functional ability and status Nutritional status Physical activity Advanced directives List of other physicians Hospitalizations, surgeries, and ER visits in previous 12 months Vitals Screenings to include cognitive, depression, and falls Referrals and appointments  In addition, I have reviewed and discussed with patient certain preventive protocols, quality metrics, and best practice recommendations. A written personalized care plan for preventive services as well as general preventive health recommendations were provided to patient.    Mr. Portal , Thank you for taking time to come for your Medicare Wellness Visit. I appreciate your ongoing commitment to your health goals. Please review the following plan we discussed and let me know if I can assist you in the future.   These are the goals we discussed:  Goals   None     This is a list of the screening recommended for you and due dates:  Health Maintenance  Topic Date Due   Pneumonia Vaccine (  1 of 2 - PCV) Never done   Zoster (Shingles) Vaccine (1 of 2) Never done   Flu Shot  04/18/2023   Medicare Annual Wellness Visit  12/25/2023   Colon Cancer Screening  06/18/2026   DTaP/Tdap/Td vaccine (3 - Td or Tdap) 12/13/2026   COVID-19 Vaccine  Completed   Hepatitis C Screening: USPSTF Recommendation to screen - Ages 46-79 yo.  Completed   HPV Vaccine  Aged [address] [place]   12/25/2022  Nurse Notes: Approximately 30 minute Non-Face -To-Face Medicare Wellness Visit . The pt complains that she runs out of food before she has money to purchase more. He also complains that his light bills have been threatened to be shut off within the past 12 months. The pt is not a [facility] patient and does not qualify for [facility]ination.

## 2022-12-25 ENCOUNTER — Ambulatory Visit (INDEPENDENT_AMBULATORY_CARE_PROVIDER_SITE_OTHER): Payer: PPO

## 2022-12-25 DIAGNOSIS — Z5986 Financial insecurity: Secondary | ICD-10-CM

## 2022-12-25 DIAGNOSIS — Z0000 Encounter for general adult medical examination without abnormal findings: Secondary | ICD-10-CM | POA: Diagnosis not present

## 2022-12-30 NOTE — Progress Notes (Signed)
Cardiology Office Note:    Date:  01/01/2023   ID:  Curtis Henry, DOB 1955-09-23, MRN [id number]  PCP:  Erick Alley, DO  Cardiologist:  Rollene Rotunda, MD  Electrophysiologist:  None   Referring MD: Erick Alley, DO   Chief Complaint  Patient presents with   Congestive Heart Failure    History of Present Illness:    Curtis Henry is a 67 y.o. male with a hx of chronic systolic heart failure, atrial fibrillation/flutter, hypertension, hyperlipidemia, squamous cell lung cancer who is referred by Dr. Yetta Barre for evaluation of chest pain.  He reports that he has been having chest pain over the last 2 to 3 months.  Reports 2 types of chest pain.  First is in center of his chest and feels like burning, seems related to eating certain foods.  Also reports pain on right side of chest when he is stressed.  No clear relationship with exertion.  Does report that he gets short of breath and has some lightheadedness with exertion.  Reports palpitations where he feels like heart is racing.  Had syncopal episode 5 years ago, none since.  Reports no lower extremity edema.  Reports compliance with Eliquis, denies any bleeding issues.  He smoked for 45 years, about 1 pack/day, quit in 2022.  Quit marijuana use recently.  Occasional alcohol use, less than 1 drink per week.  No family history of heart disease  Zio patch x 14 days 10/2022 showed 1 episode of NSVT lasting 5 beats, 01/22/2020 episodes of SVT with longest lasting 1 minute 37 seconds with average rate 173 bpm, atrial flutter with 2% burden with average rate 140 bpm, longest episode lasting 35 minutes 52 seconds with average rate 149 bpm, frequent PACs (7.7% of beats).  Echocardiogram 12/21/2022 showed EF 40 to 45%, global hypokinesis, indeterminate diastolic function, mildly reduced RV function.    Past Medical History:  Diagnosis Date   Anxiety state 03/08/2018   Bursitis of left shoulder    Essential hypertension 01/31/2018   In 2019, has not been  on medication since this time.   GERD (gastroesophageal reflux disease)    Hyperlipidemia    Restless leg syndrome    Squamous cell lung cancer     Past Surgical History:  Procedure Laterality Date   CIRCUMCISION  12/13/2003   COLONOSCOPY     VIDEO ASSISTED THORACOSCOPY (VATS)/ LOBECTOMY Left 01/17/2018   Procedure: left VIDEO ASSISTED THORACOSCOPY (VATS) with left lower lobe wedge resection and LOBECTOMY;  Surgeon: Kerin Perna, MD;  Location: [facility];  Service: Thoracic;  Laterality: Left;    Current Medications: No outpatient medications have been marked as taking for the 01/01/23 encounter (Office Visit) with Little Ishikawa, MD.     Allergies:   Lactose intolerance (gi)   Social History   Socioeconomic History   Marital status: Widowed    Spouse name: Not on file   Number of children: 5   Years of education: Not on file   Highest education level: Not on file  Occupational History   Occupation: [employer]  Tobacco Use   Smoking status: Former    Packs/day: 0.10    Years: 42.00    Additional pack years: 0.00    Total pack years: 4.20    Types: Cigarettes    Start date: 09/17/1974    Quit date: 2022    Years since quitting: 2.2   Smokeless tobacco: Never  Vaping Use   Vaping Use: Some days  Substances: Nicotine, Flavoring  Substance and Sexual Activity   Alcohol use: Not Currently   Drug use: Yes    Frequency: 7.0 times per week    Types: Marijuana    Comment: smokes marijuana; crack use distant past >30 yrs ago   Sexual activity: Not Currently  Other Topics Concern   Not on file  Social History Narrative   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    Social Determinants of Health   Financial Resource Strain: Medium Risk (12/25/2022)   Overall Financial Resource Strain (CARDIA)    Difficulty of Paying Living Expenses: Somewhat hard  Food Insecurity: Food Insecurity Present (12/25/2022)   Hunger Vital Sign    Worried About Running Out of Food in the  Last Year: Sometimes true    Ran Out of Food in the Last Year: Sometimes true  Transportation Needs: No Transportation Needs (12/25/2022)   PRAPARE - Administrator, Civil Service (Medical): No    Lack of Transportation (Non-Medical): No  Physical Activity: Inactive (12/25/2022)   Exercise Vital Sign    Days of Exercise per Week: 0 days    Minutes of Exercise per Session: 0 min  Stress: Stress Concern Present (12/25/2022)   [employer] of Occupational Health - Occupational Stress Questionnaire    Feeling of Stress : To some extent  Social Connections: Socially Isolated (12/25/2022)   Social Connection and Isolation Panel [NHANES]    Frequency of Communication with Friends and Family: More than three times a week    Frequency of Social Gatherings with Friends and Family: More than three times a week    Attends Religious Services: Never    Database administrator or Organizations: No    Attends Banker Meetings: Never    Marital Status: Widowed     Family History: The patient's family history includes Arthritis in an other family member; Colon cancer (age of onset: 50) in his father; Esophageal cancer in his brother.  ROS:   Please see the history of present illness.     All other systems reviewed and are negative.  EKGs/Labs/Other Studies Reviewed:    The following studies were reviewed today:   EKG:   01/01/2023: Normal sinus rhythm, rate 65, no ST abnormalities  Recent Labs: 09/14/2022: TSH 0.887 11/28/2022: ALT 18; BUN 10; Creatinine, Ser 1.07; Hemoglobin 15.9; Platelets 214; Potassium 3.9; Sodium 139  Recent Lipid Panel    Component Value Date/Time   CHOL 187 03/22/2020 1708   TRIG 119 03/22/2020 1708   HDL 50 03/22/2020 1708   CHOLHDL 3.7 03/22/2020 1708   CHOLHDL 3.9 04/18/2010 0545   VLDL 12 04/18/2010 0545   LDLCALC 116 (H) 03/22/2020 1708    Physical Exam:    VS:  BP 126/72 (BP Location: Right Arm, Patient Position: Sitting, Cuff Size:  Normal)   Pulse 65   Ht 6' (1.829 m)   Wt 129 lb 9.6 oz (58.8 kg)   SpO2 96%   BMI 17.58 kg/m     Wt Readings from Last 3 Encounters:  01/01/23 129 lb 9.6 oz (58.8 kg)  12/12/22 131 lb (59.4 kg)  11/28/22 134 lb 9.6 oz (61.1 kg)     GEN:  Well nourished, well developed in no acute distress HEENT: Normal NECK: No JVD; No carotid bruits LYMPHATICS: No lymphadenopathy CARDIAC: RRR, no murmurs, rubs, gallops RESPIRATORY:  Clear to auscultation without rales, wheezing or rhonchi  ABDOMEN: Soft, non-tender, non-distended MUSCULOSKELETAL:  No edema; No deformity  SKIN: Warm and dry NEUROLOGIC:  Alert and oriented x 3 PSYCHIATRIC:  Normal affect   ASSESSMENT:    1. Chronic systolic heart failure   2. Chest pain of uncertain etiology   3. Atrial flutter, unspecified type   4. Pre-procedure lab exam   5. SVT (supraventricular tachycardia)   6. Essential hypertension   7. Hyperlipidemia, unspecified hyperlipidemia type    PLAN:    Chronic systolic heart failure:Echocardiogram 12/21/2022 showed EF 40 to 45%, global hypokinesis, indeterminate diastolic function, mildly reduced RV function -He is reporting atypical chest pain but does occur when he is under stress, suggesting possible angina.  Has multiple CAD risk factors (age, hypertension, hyperlipidemia, tobacco use).  Recommend LHC/RHC to evaluate for ischemic cardiomyopathy.  Risks and benefits of cardiac catheterization have been discussed with the patient.  These include bleeding, infection, kidney damage, stroke, heart attack, death.  The patient understands these risks and is willing to proceed.  -Continue Toprol-XL 50 mg daily -Continue olmesartan -Plan to add additional GDMT after cath  Atrial flutter: Zio patch x 14 days 10/2022 showed 1 episode of NSVT lasting 5 beats, 01/22/2020 episodes of SVT with longest lasting 1 minute 37 seconds with average rate 173 bpm, atrial flutter with 2% burden with average rate 140 bpm, longest  episode lasting 35 minutes 52 seconds with average rate 149 bpm, frequent PACs (7.7% of beats).   -CHA2DS2-VASc 3 (CHF, hypertension, age).  Continue Eliquis 5 mg twice daily -Continue Toprol-XL 50 mg daily  SVT/frequent PACs: ZIO monitor results 10/2022 as above.  Started Toprol-XL 50 mg daily  Hypertension: On amlodipine-olmesartan 10-20 mg daily, HCTZ 25 mg daily, Toprol-XL 50 mg daily.  Appears controlled  Hyperlipidemia: On atorvastatin 40 mg daily  RTC in 2 weeks  Medication Adjustments/Labs and Tests Ordered: Current medicines are reviewed at length with the patient today.  Concerns regarding medicines are outlined above.  Orders Placed This Encounter  Procedures   Basic metabolic panel   CBC   Magnesium   EKG 12-Lead   No orders of the defined types were placed in this encounter.   Patient Instructions  [facility] AT [facility] [id number] [place] [address] Dept: [phone] Loc: [phone]  [facility] [place]  01/01/2023  You are scheduled for a Cardiac Catheterization on Tuesday, April 23 with Dr. Bryan Lemma.  1. Please arrive at the [facility] (Main Entrance A) at [facility]: [address] [place], [place] [address] at 7:00 AM (This time is two hours before your procedure to ensure your preparation). Free valet parking service is available. You will check in at ADMITTING. The support person will be asked to wait in the waiting room.  It is OK to have someone drop you off and come back when you are ready to be discharged.    Special note: Every effort is made to have your procedure done on time. Please understand that emergencies sometimes delay scheduled procedures.  2. Diet: Do not eat solid foods after midnight.  The patient may have clear liquids until 5am upon the day of the procedure.  3. Labs: today in office  4. Medication  instructions in preparation for your procedure:   Contrast Allergy: No  Hold Eliquis for 2 days prior to procedure (No Eliquis on Sunday or Monday)  Hold HCTZ the morning of procedure  On the morning of your procedure, take your Aspirin  81 mg and any morning medicines NOT listed above.  You may use sips of water.  5. Plan to go home the same day, you will only stay overnight if medically necessary. 6. Bring a current list of your medications and current insurance cards. 7. You MUST have a responsible person to drive you home. 8. Someone MUST be with you the first 24 hours after you arrive home or your discharge will be delayed. 9. Please wear clothes that are easy to get on and off and wear slip-on shoes.  Thank you for allowing [place] to care for you!   -- [facility]    Follow up Monday 4/29 at 9:20 AM with Dr. Bjorn Pippin   Signed, Little Ishikawa, MD  01/01/2023 5:51 PM    [facility]

## 2023-01-01 ENCOUNTER — Other Ambulatory Visit: Payer: Self-pay | Admitting: Student

## 2023-01-01 ENCOUNTER — Ambulatory Visit: Payer: PPO | Admitting: Student

## 2023-01-01 ENCOUNTER — Ambulatory Visit: Payer: PPO | Attending: Cardiology | Admitting: Cardiology

## 2023-01-01 VITALS — BP 126/72 | HR 65 | Ht 72.0 in | Wt 129.6 lb

## 2023-01-01 DIAGNOSIS — E785 Hyperlipidemia, unspecified: Secondary | ICD-10-CM

## 2023-01-01 DIAGNOSIS — I4892 Unspecified atrial flutter: Secondary | ICD-10-CM | POA: Diagnosis not present

## 2023-01-01 DIAGNOSIS — Z01812 Encounter for preprocedural laboratory examination: Secondary | ICD-10-CM

## 2023-01-01 DIAGNOSIS — R079 Chest pain, unspecified: Secondary | ICD-10-CM | POA: Diagnosis not present

## 2023-01-01 DIAGNOSIS — I10 Essential (primary) hypertension: Secondary | ICD-10-CM

## 2023-01-01 DIAGNOSIS — I471 Supraventricular tachycardia, unspecified: Secondary | ICD-10-CM

## 2023-01-01 DIAGNOSIS — I5022 Chronic systolic (congestive) heart failure: Secondary | ICD-10-CM | POA: Diagnosis not present

## 2023-01-01 DIAGNOSIS — G2581 Restless legs syndrome: Secondary | ICD-10-CM

## 2023-01-01 NOTE — Patient Instructions (Signed)
[facility] A DEPT OF [facility] AT [facility] [id number] [place] [address] Dept: [phone] Loc: [phone]  Curtis Henry  01/01/2023  You are scheduled for a Cardiac Catheterization on Tuesday, April 23 with Dr. Bryan Lemma.  1. Please arrive at the [facility] (Main Entrance A) at [facility]: [address] [place], [place] [address] at 7:00 AM (This time is two hours before your procedure to ensure your preparation). Free valet parking service is available. You will check in at ADMITTING. The support person will be asked to wait in the waiting room.  It is OK to have someone drop you off and come back when you are ready to be discharged.    Special note: Every effort is made to have your procedure done on time. Please understand that emergencies sometimes delay scheduled procedures.  2. Diet: Do not eat solid foods after midnight.  The patient may have clear liquids until 5am upon the day of the procedure.  3. Labs: today in office  4. Medication instructions in preparation for your procedure:   Contrast Allergy: No  Hold Eliquis for 2 days prior to procedure (No Eliquis on Sunday or Monday)  Hold HCTZ the morning of procedure  On the morning of your procedure, take your Aspirin 81 mg and any morning medicines NOT listed above.  You may use sips of water.  5. Plan to go home the same day, you will only stay overnight if medically necessary. 6. Bring a current list of your medications and current insurance cards. 7. You MUST have a responsible person to drive you home. 8. Someone MUST be with you the first 24 hours after you arrive home or your discharge will be delayed. 9. Please wear clothes that are easy to get on and off and wear slip-on shoes.  Thank you for allowing [place] to care for you!   -- [facility]    Follow up Monday 4/29  at 9:20 AM with Dr. Bjorn Pippin

## 2023-01-02 ENCOUNTER — Other Ambulatory Visit: Payer: Self-pay | Admitting: *Deleted

## 2023-01-02 DIAGNOSIS — I10 Essential (primary) hypertension: Secondary | ICD-10-CM

## 2023-01-02 DIAGNOSIS — Z79899 Other long term (current) drug therapy: Secondary | ICD-10-CM

## 2023-01-02 DIAGNOSIS — I5022 Chronic systolic (congestive) heart failure: Secondary | ICD-10-CM

## 2023-01-02 LAB — CBC
Hematocrit: 42.7 % (ref 37.5–51.0)
Hemoglobin: 14.2 g/dL (ref 13.0–17.7)
MCH: 28.6 pg (ref 26.6–33.0)
MCHC: 33.3 g/dL (ref 31.5–35.7)
MCV: 86 fL (ref 79–97)
Platelets: 225 10*3/uL (ref 150–450)
RBC: 4.96 x10E6/uL (ref 4.14–5.80)
RDW: 13.4 % (ref 11.6–15.4)
WBC: 7.3 10*3/uL (ref 3.4–10.8)

## 2023-01-02 LAB — BASIC METABOLIC PANEL
BUN/Creatinine Ratio: 14 (ref 10–24)
BUN: 26 mg/dL (ref 8–27)
CO2: 24 mmol/L (ref 20–29)
Calcium: 9.8 mg/dL (ref 8.6–10.2)
Chloride: 99 mmol/L (ref 96–106)
Creatinine, Ser: 1.85 mg/dL — ABNORMAL HIGH (ref 0.76–1.27)
Glucose: 90 mg/dL (ref 70–99)
Potassium: 4.9 mmol/L (ref 3.5–5.2)
Sodium: 140 mmol/L (ref 134–144)
eGFR: 39 mL/min/{1.73_m2} — ABNORMAL LOW (ref >59)

## 2023-01-02 LAB — MAGNESIUM: Magnesium: 2.3 mg/dL (ref 1.6–2.3)

## 2023-01-02 MED ORDER — AMLODIPINE BESYLATE 10 MG PO TABS: 10.0000 mg | ORAL_TABLET | Freq: Every day | ORAL | 3 refills | Status: DC

## 2023-01-02 MED ORDER — ISOSORBIDE MONONITRATE ER 30 MG PO TB24: 30.0000 mg | ORAL_TABLET | Freq: Every day | ORAL | 3 refills | Status: DC

## 2023-01-02 MED ORDER — HYDRALAZINE HCL 25 MG PO TABS: 25.0000 mg | ORAL_TABLET | Freq: Three times a day (TID) | ORAL | 3 refills | Status: DC

## 2023-01-08 ENCOUNTER — Encounter (HOSPITAL_COMMUNITY): Payer: Self-pay

## 2023-01-08 ENCOUNTER — Ambulatory Visit (HOSPITAL_COMMUNITY): Admit: 2023-01-08 | Payer: PPO | Admitting: Cardiology

## 2023-01-08 SURGERY — RIGHT/LEFT HEART CATH AND CORONARY ANGIOGRAPHY
Anesthesia: LOCAL

## 2023-01-13 NOTE — H&P (View-Only) (Signed)
Cardiology Office Note:    Date:  01/15/2023   ID:  TAIQUAN CAMPANARO, DOB November 12, 1955, MRN [id number]  PCP:  Erick Alley, DO  Cardiologist:  Little Ishikawa, MD  Electrophysiologist:  None   Referring MD: Erick Alley, DO   Chief Complaint  Patient presents with   Congestive Heart Failure    History of Present Illness:    Curtis Henry is a 67 y.o. male with a hx of chronic systolic heart failure, atrial fibrillation/flutter, hypertension, hyperlipidemia, squamous cell lung cancer who presents for follow-up.  He was referred by Dr. Yetta Barre for evaluation of chest pain, initially seen 01/01/2023.  He reports that he has been having chest pain over the last 2 to 3 months.  Reports 2 types of chest pain.  First is in center of his chest and feels like burning, seems related to eating certain foods.  Also reports pain on right side of chest when he is stressed.  No clear relationship with exertion.  Does report that he gets short of breath and has some lightheadedness with exertion.  Reports palpitations where he feels like heart is racing.  Had syncopal episode 5 years ago, none since.  Reports no lower extremity edema.  Reports compliance with Eliquis, denies any bleeding issues.  He smoked for 45 years, about 1 pack/day, quit in 2022.  Quit marijuana use recently.  Occasional alcohol use, less than 1 drink per week.  No family history of heart disease  Zio patch x 14 days 10/2022 showed 1 episode of NSVT lasting 5 beats, 01/22/2020 episodes of SVT with longest lasting 1 minute 37 seconds with average rate 173 bpm, atrial flutter with 2% burden with average rate 140 bpm, longest episode lasting 35 minutes 52 seconds with average rate 149 bpm, frequent PACs (7.7% of beats).  Echocardiogram 12/21/2022 showed EF 40 to 45%, global hypokinesis, indeterminate diastolic function, mildly reduced RV function.  At initial clinic visit, cardiac cath was ordered for workup of chest pain and systolic heart  failure.  However his creatinine was 1.85 on 01/01/2023 (increased from 1.07 on 11/28/2022), so catheterization was canceled.  Since last clinic visit, he reports that he is doing okay.  He denies any chest pain recently.  No lower extremity edema.  Does report he is having dyspnea with exertion.  Also reports some lightheadedness but denies any syncope.  Continues to have frequent palpitations.  Wt Readings from Last 3 Encounters:  01/14/23 134 lb (60.8 kg)  01/01/23 129 lb 9.6 oz (58.8 kg)  12/12/22 131 lb (59.4 kg)     Past Medical History:  Diagnosis Date   Anxiety state 03/08/2018   Bursitis of left shoulder    Essential hypertension 01/31/2018   In 2019, has not been on medication since this time.   GERD (gastroesophageal reflux disease)    Hyperlipidemia    Restless leg syndrome    Squamous cell lung cancer [facility])     Past Surgical History:  Procedure Laterality Date   CIRCUMCISION  12/13/2003   COLONOSCOPY     VIDEO ASSISTED THORACOSCOPY (VATS)/ LOBECTOMY Left 01/17/2018   Procedure: left VIDEO ASSISTED THORACOSCOPY (VATS) with left lower lobe wedge resection and LOBECTOMY;  Surgeon: Kerin Perna, MD;  Location: [facility] OR;  Service: Thoracic;  Laterality: Left;    Current Medications: Current Meds  Medication Sig   amLODipine (NORVASC) 10 MG tablet Take 1 tablet (10 mg total) by mouth daily.   apixaban (ELIQUIS) 5 MG TABS tablet Take  1 tablet (5 mg total) by mouth 2 (two) times daily.   atorvastatin (LIPITOR) 40 MG tablet Take 1 tablet (40 mg total) by mouth daily.   betamethasone valerate ointment (VALISONE) 0.1 % Apply 1 application topically 2 (two) times daily.   gabapentin (NEURONTIN) 600 MG tablet TAKE 1 TABLET BY MOUTH AT BEDTIME   hydrALAZINE (APRESOLINE) 25 MG tablet Take 1 tablet (25 mg total) by mouth 3 (three) times daily.   isosorbide mononitrate (IMDUR) 30 MG 24 hr tablet Take 1 tablet (30 mg total) by mouth daily.   Multiple Vitamin (MULTIVITAMIN) tablet Take 1  tablet by mouth daily.   Omega-3 Fatty Acids (FISH OIL) 360 MG CAPS Take by mouth.   [DISCONTINUED] metoprolol succinate (TOPROL-XL) 50 MG 24 hr tablet Take 1 tablet (50 mg total) by mouth at bedtime. Take with or immediately following a meal.   [DISCONTINUED] omega-3 acid ethyl esters (LOVAZA) 1 g capsule Take by mouth 2 (two) times daily.     Allergies:   Lactose intolerance (gi)   Social History   Socioeconomic History   Marital status: Widowed    Spouse name: Not on file   Number of children: 5   Years of education: Not on file   Highest education level: Not on file  Occupational History   Occupation: [employer]  Tobacco Use   Smoking status: Former    Packs/day: 0.10    Years: 42.00    Additional pack years: 0.00    Total pack years: 4.20    Types: Cigarettes    Start date: 09/17/1974    Quit date: 2022    Years since quitting: 2.3   Smokeless tobacco: Never  Vaping Use   Vaping Use: Some days   Substances: Nicotine, Flavoring  Substance and Sexual Activity   Alcohol use: Not Currently   Drug use: Yes    Frequency: 7.0 times per week    Types: Marijuana    Comment: smokes marijuana; crack use distant past >30 yrs ago   Sexual activity: Not Currently  Other Topics Concern   Not on file  Social History Narrative   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    Social Determinants of Health   Financial Resource Strain: Medium Risk (12/25/2022)   Overall Financial Resource Strain (CARDIA)    Difficulty of Paying Living Expenses: Somewhat hard  Food Insecurity: Food Insecurity Present (12/25/2022)   Hunger Vital Sign    Worried About Running Out of Food in the Last Year: Sometimes true    Ran Out of Food in the Last Year: Sometimes true  Transportation Needs: No Transportation Needs (12/25/2022)   PRAPARE - Administrator, Civil Service (Medical): No    Lack of Transportation (Non-Medical): No  Physical Activity: Inactive (12/25/2022)   Exercise Vital Sign     Days of Exercise per Week: 0 days    Minutes of Exercise per Session: 0 min  Stress: Stress Concern Present (12/25/2022)   [employer] of Occupational Health - Occupational Stress Questionnaire    Feeling of Stress : To some extent  Social Connections: Socially Isolated (12/25/2022)   Social Connection and Isolation Panel [NHANES]    Frequency of Communication with Friends and Family: More than three times a week    Frequency of Social Gatherings with Friends and Family: More than three times a week    Attends Religious Services: Never    Database administrator or Organizations: No    Attends [employer]  or Organization Meetings: Never    Marital Status: Widowed     Family History: The patient's family history includes Arthritis in an other family member; Colon cancer (age of onset: 68) in his father; Esophageal cancer in his brother.  ROS:   Please see the history of present illness.     All other systems reviewed and are negative.  EKGs/Labs/Other Studies Reviewed:    The following studies were reviewed today:   EKG:   01/01/2023: Normal sinus rhythm, rate 65, no ST abnormalities  Recent Labs: 09/14/2022: TSH 0.887 11/28/2022: ALT 18 01/01/2023: Hemoglobin 14.2; Magnesium 2.3; Platelets 225 01/14/2023: BUN 9; Creatinine, Ser 1.07; Potassium 4.4; Sodium 140  Recent Lipid Panel    Component Value Date/Time   CHOL 187 03/22/2020 1708   TRIG 119 03/22/2020 1708   HDL 50 03/22/2020 1708   CHOLHDL 3.7 03/22/2020 1708   CHOLHDL 3.9 04/18/2010 0545   VLDL 12 04/18/2010 0545   LDLCALC 116 (H) 03/22/2020 1708    Physical Exam:    VS:  BP (!) 148/68   Pulse 83   Ht 6' (1.829 m)   Wt 134 lb (60.8 kg)   BMI 18.17 kg/m     Wt Readings from Last 3 Encounters:  01/14/23 134 lb (60.8 kg)  01/01/23 129 lb 9.6 oz (58.8 kg)  12/12/22 131 lb (59.4 kg)     GEN:  Well nourished, well developed in no acute distress HEENT: Normal NECK: No JVD; No carotid bruits LYMPHATICS: No  lymphadenopathy CARDIAC: RRR, no murmurs, rubs, gallops RESPIRATORY:  Clear to auscultation without rales, wheezing or rhonchi  ABDOMEN: Soft, non-tender, non-distended MUSCULOSKELETAL:  No edema; No deformity  SKIN: Warm and dry NEUROLOGIC:  Alert and oriented x 3 PSYCHIATRIC:  Normal affect   ASSESSMENT:    1. Chronic systolic heart failure (HCC)   2. Medication management   3. Essential hypertension     PLAN:    Chronic systolic heart failure:Echocardiogram 12/21/2022 showed EF 40 to 45%, global hypokinesis, indeterminate diastolic function, mildly reduced RV function -He is reporting atypical chest pain but does occur when he is under stress, suggesting possible angina.  Has multiple CAD risk factors (age, hypertension, hyperlipidemia, tobacco use).  Recommend LHC/RHC to evaluate for ischemic cardiomyopathy.  This was canceled at last clinic visit due to AKI, with creatinine 1.85 on 01/01/2023 -Continue Toprol-XL, will increase to 75 mg daily -ACE/ARB/ARNI on hold given AKI -Continue hydralazine 25 mg 3 times daily and Imdur 30 mg daily -Recheck BMET.  If renal function back to baseline, plan for LHC/RHC  ADDENDUM: Renal function has normalized, Cr 1.07.  Will plan for LHC/RHC.  Risks and benefits of cardiac catheterization have been discussed with the patient.  These include bleeding, infection, kidney damage, stroke, heart attack, death.  The patient understands these risks and is willing to proceed.   AKI: creatinine was 1.85 on 01/01/2023 (increased from 1.07 on 11/28/2022).  HCTZ and olmesartan were discontinued.  Recheck BMET  Atrial flutter: Zio patch x 14 days 10/2022 showed 1 episode of NSVT lasting 5 beats, 01/22/2020 episodes of SVT with longest lasting 1 minute 37 seconds with average rate 173 bpm, atrial flutter with 2% burden with average rate 140 bpm, longest episode lasting 35 minutes 52 seconds with average rate 149 bpm, frequent PACs (7.7% of beats).   -CHA2DS2-VASc 3  (CHF, hypertension, age).  Continue Eliquis 5 mg twice daily -Continue Toprol-XL, will increase to 75 mg daily  SVT/frequent PACs: ZIO monitor results  10/2022 as above.  Started Toprol-XL 50 mg daily.  Continues to have frequent palpitations, will increase to 75 mg daily  Hypertension: On amlodipine 10 mg daily, hydralazine 25 mg 3 times daily, Imdur 30 mg daily, Toprol-XL 50 mg daily.  BP mildly elevated, will increase metoprolol as above  Hyperlipidemia: On atorvastatin 40 mg daily  RTC in 1 month  Medication Adjustments/Labs and Tests Ordered: Current medicines are reviewed at length with the patient today.  Concerns regarding medicines are outlined above.  No orders of the defined types were placed in this encounter.  Meds ordered this encounter  Medications   metoprolol succinate (TOPROL-XL) 50 MG 24 hr tablet    Sig: Take 1.5 tablets (75 mg total) by mouth at bedtime. Take with or immediately following a meal.    Dispense:  135 tablet    Refill:  3    Patient Instructions  Medication Instructions:  INCREASE metoprolol succinate (Toprol XL) to 75 mg daily  *If you need a refill on your cardiac medications before your next appointment, please call your pharmacy*   Lab Work: BMET today  If you have labs (blood work) drawn today and your tests are completely normal, you will receive your results only by: MyChart Message (if you have MyChart) OR A paper copy in the mail If you have any lab test that is abnormal or we need to change your treatment, we will call you to review the results.  Follow-Up: At [facility], you and your health needs are our priority.  As part of our continuing mission to provide you with exceptional heart care, we have created designated Provider Care Teams.  These Care Teams include your primary Cardiologist (physician) and Advanced Practice Providers (APPs -  Physician Assistants and Nurse Practitioners) who all work together to provide you  with the care you need, when you need it.  We recommend signing up for the patient portal called "MyChart".  Sign up information is provided on this After Visit Summary.  MyChart is used to connect with patients for Virtual Visits (Telemedicine).  Patients are able to view lab/test results, encounter notes, upcoming appointments, etc.  Non-urgent messages can be sent to your provider as well.   To learn more about what you can do with MyChart, go to [email or web address].    Your next appointment:   1 month(s)  Provider:   Dr. Bjorn Pippin   Signed, Little Ishikawa, MD  01/15/2023 11:01 AM    [facility]

## 2023-01-13 NOTE — Progress Notes (Unsigned)
Cardiology Office Note:    Date:  01/15/2023   ID:  Curtis Henry, DOB May 07, 1956, MRN [id number]  PCP:  Erick Alley, DO  Cardiologist:  Little Ishikawa, MD  Electrophysiologist:  None   Referring MD: Erick Alley, DO   Chief Complaint  Patient presents with   Congestive Heart Failure    History of Present Illness:    Curtis Henry is a 67 y.o. male with a hx of chronic systolic heart failure, atrial fibrillation/flutter, hypertension, hyperlipidemia, squamous cell lung cancer who presents for follow-up.  He was referred by Dr. Yetta Barre for evaluation of chest pain, initially seen 01/01/2023.  He reports that he has been having chest pain over the last 2 to 3 months.  Reports 2 types of chest pain.  First is in center of his chest and feels like burning, seems related to eating certain foods.  Also reports pain on right side of chest when he is stressed.  No clear relationship with exertion.  Does report that he gets short of breath and has some lightheadedness with exertion.  Reports palpitations where he feels like heart is racing.  Had syncopal episode 5 years ago, none since.  Reports no lower extremity edema.  Reports compliance with Eliquis, denies any bleeding issues.  He smoked for 45 years, about 1 pack/day, quit in 2022.  Quit marijuana use recently.  Occasional alcohol use, less than 1 drink per week.  No family history of heart disease  Zio patch x 14 days 10/2022 showed 1 episode of NSVT lasting 5 beats, 01/22/2020 episodes of SVT with longest lasting 1 minute 37 seconds with average rate 173 bpm, atrial flutter with 2% burden with average rate 140 bpm, longest episode lasting 35 minutes 52 seconds with average rate 149 bpm, frequent PACs (7.7% of beats).  Echocardiogram 12/21/2022 showed EF 40 to 45%, global hypokinesis, indeterminate diastolic function, mildly reduced RV function.  At initial clinic visit, cardiac cath was ordered for workup of chest pain and systolic heart  failure.  However his creatinine was 1.85 on 01/01/2023 (increased from 1.07 on 11/28/2022), so catheterization was canceled.  Since last clinic visit, he reports that he is doing okay.  He denies any chest pain recently.  No lower extremity edema.  Does report he is having dyspnea with exertion.  Also reports some lightheadedness but denies any syncope.  Continues to have frequent palpitations.  Wt Readings from Last 3 Encounters:  01/14/23 134 lb (60.8 kg)  01/01/23 129 lb 9.6 oz (58.8 kg)  12/12/22 131 lb (59.4 kg)     Past Medical History:  Diagnosis Date   Anxiety state 03/08/2018   Bursitis of left shoulder    Essential hypertension 01/31/2018   In 2019, has not been on medication since this time.   GERD (gastroesophageal reflux disease)    Hyperlipidemia    Restless leg syndrome    Squamous cell lung cancer [facility])     Past Surgical History:  Procedure Laterality Date   CIRCUMCISION  12/13/2003   COLONOSCOPY     VIDEO ASSISTED THORACOSCOPY (VATS)/ LOBECTOMY Left 01/17/2018   Procedure: left VIDEO ASSISTED THORACOSCOPY (VATS) with left lower lobe wedge resection and LOBECTOMY;  Surgeon: Kerin Perna, MD;  Location: [facility] OR;  Service: Thoracic;  Laterality: Left;    Current Medications: Current Meds  Medication Sig   amLODipine (NORVASC) 10 MG tablet Take 1 tablet (10 mg total) by mouth daily.   apixaban (ELIQUIS) 5 MG TABS tablet Take  1 tablet (5 mg total) by mouth 2 (two) times daily.   atorvastatin (LIPITOR) 40 MG tablet Take 1 tablet (40 mg total) by mouth daily.   betamethasone valerate ointment (VALISONE) 0.1 % Apply 1 application topically 2 (two) times daily.   gabapentin (NEURONTIN) 600 MG tablet TAKE 1 TABLET BY MOUTH AT BEDTIME   hydrALAZINE (APRESOLINE) 25 MG tablet Take 1 tablet (25 mg total) by mouth 3 (three) times daily.   isosorbide mononitrate (IMDUR) 30 MG 24 hr tablet Take 1 tablet (30 mg total) by mouth daily.   Multiple Vitamin (MULTIVITAMIN) tablet Take 1  tablet by mouth daily.   Omega-3 Fatty Acids (FISH OIL) 360 MG CAPS Take by mouth.   [DISCONTINUED] metoprolol succinate (TOPROL-XL) 50 MG 24 hr tablet Take 1 tablet (50 mg total) by mouth at bedtime. Take with or immediately following a meal.   [DISCONTINUED] omega-3 acid ethyl esters (LOVAZA) 1 g capsule Take by mouth 2 (two) times daily.     Allergies:   Lactose intolerance (gi)   Social History   Socioeconomic History   Marital status: Widowed    Spouse name: Not on file   Number of children: 5   Years of education: Not on file   Highest education level: Not on file  Occupational History   Occupation: [employer]  Tobacco Use   Smoking status: Former    Packs/day: 0.10    Years: 42.00    Additional pack years: 0.00    Total pack years: 4.20    Types: Cigarettes    Start date: 09/17/1974    Quit date: 2022    Years since quitting: 2.3   Smokeless tobacco: Never  Vaping Use   Vaping Use: Some days   Substances: Nicotine, Flavoring  Substance and Sexual Activity   Alcohol use: Not Currently   Drug use: Yes    Frequency: 7.0 times per week    Types: Marijuana    Comment: smokes marijuana; crack use distant past >30 yrs ago   Sexual activity: Not Currently  Other Topics Concern   Not on file  Social History Narrative   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    Social Determinants of Health   Financial Resource Strain: Medium Risk (12/25/2022)   Overall Financial Resource Strain (CARDIA)    Difficulty of Paying Living Expenses: Somewhat hard  Food Insecurity: Food Insecurity Present (12/25/2022)   Hunger Vital Sign    Worried About Running Out of Food in the Last Year: Sometimes true    Ran Out of Food in the Last Year: Sometimes true  Transportation Needs: No Transportation Needs (12/25/2022)   PRAPARE - Administrator, Civil Service (Medical): No    Lack of Transportation (Non-Medical): No  Physical Activity: Inactive (12/25/2022)   Exercise Vital Sign     Days of Exercise per Week: 0 days    Minutes of Exercise per Session: 0 min  Stress: Stress Concern Present (12/25/2022)   [employer] of Occupational Health - Occupational Stress Questionnaire    Feeling of Stress : To some extent  Social Connections: Socially Isolated (12/25/2022)   Social Connection and Isolation Panel [NHANES]    Frequency of Communication with Friends and Family: More than three times a week    Frequency of Social Gatherings with Friends and Family: More than three times a week    Attends Religious Services: Never    Database administrator or Organizations: No    Attends [employer]  or Organization Meetings: Never    Marital Status: Widowed     Family History: The patient's family history includes Arthritis in an other family member; Colon cancer (age of onset: 101) in his father; Esophageal cancer in his brother.  ROS:   Please see the history of present illness.     All other systems reviewed and are negative.  EKGs/Labs/Other Studies Reviewed:    The following studies were reviewed today:   EKG:   01/01/2023: Normal sinus rhythm, rate 65, no ST abnormalities  Recent Labs: 09/14/2022: TSH 0.887 11/28/2022: ALT 18 01/01/2023: Hemoglobin 14.2; Magnesium 2.3; Platelets 225 01/14/2023: BUN 9; Creatinine, Ser 1.07; Potassium 4.4; Sodium 140  Recent Lipid Panel    Component Value Date/Time   CHOL 187 03/22/2020 1708   TRIG 119 03/22/2020 1708   HDL 50 03/22/2020 1708   CHOLHDL 3.7 03/22/2020 1708   CHOLHDL 3.9 04/18/2010 0545   VLDL 12 04/18/2010 0545   LDLCALC 116 (H) 03/22/2020 1708    Physical Exam:    VS:  BP (!) 148/68   Pulse 83   Ht 6' (1.829 m)   Wt 134 lb (60.8 kg)   BMI 18.17 kg/m     Wt Readings from Last 3 Encounters:  01/14/23 134 lb (60.8 kg)  01/01/23 129 lb 9.6 oz (58.8 kg)  12/12/22 131 lb (59.4 kg)     GEN:  Well nourished, well developed in no acute distress HEENT: Normal NECK: No JVD; No carotid bruits LYMPHATICS: No  lymphadenopathy CARDIAC: RRR, no murmurs, rubs, gallops RESPIRATORY:  Clear to auscultation without rales, wheezing or rhonchi  ABDOMEN: Soft, non-tender, non-distended MUSCULOSKELETAL:  No edema; No deformity  SKIN: Warm and dry NEUROLOGIC:  Alert and oriented x 3 PSYCHIATRIC:  Normal affect   ASSESSMENT:    1. Chronic systolic heart failure (HCC)   2. Medication management   3. Essential hypertension     PLAN:    Chronic systolic heart failure:Echocardiogram 12/21/2022 showed EF 40 to 45%, global hypokinesis, indeterminate diastolic function, mildly reduced RV function -He is reporting atypical chest pain but does occur when he is under stress, suggesting possible angina.  Has multiple CAD risk factors (age, hypertension, hyperlipidemia, tobacco use).  Recommend LHC/RHC to evaluate for ischemic cardiomyopathy.  This was canceled at last clinic visit due to AKI, with creatinine 1.85 on 01/01/2023 -Continue Toprol-XL, will increase to 75 mg daily -ACE/ARB/ARNI on hold given AKI -Continue hydralazine 25 mg 3 times daily and Imdur 30 mg daily -Recheck BMET.  If renal function back to baseline, plan for LHC/RHC  ADDENDUM: Renal function has normalized, Cr 1.07.  Will plan for LHC/RHC.  Risks and benefits of cardiac catheterization have been discussed with the patient.  These include bleeding, infection, kidney damage, stroke, heart attack, death.  The patient understands these risks and is willing to proceed.   AKI: creatinine was 1.85 on 01/01/2023 (increased from 1.07 on 11/28/2022).  HCTZ and olmesartan were discontinued.  Recheck BMET  Atrial flutter: Zio patch x 14 days 10/2022 showed 1 episode of NSVT lasting 5 beats, 01/22/2020 episodes of SVT with longest lasting 1 minute 37 seconds with average rate 173 bpm, atrial flutter with 2% burden with average rate 140 bpm, longest episode lasting 35 minutes 52 seconds with average rate 149 bpm, frequent PACs (7.7% of beats).   -CHA2DS2-VASc 3  (CHF, hypertension, age).  Continue Eliquis 5 mg twice daily -Continue Toprol-XL, will increase to 75 mg daily  SVT/frequent PACs: ZIO monitor results  10/2022 as above.  Started Toprol-XL 50 mg daily.  Continues to have frequent palpitations, will increase to 75 mg daily  Hypertension: On amlodipine 10 mg daily, hydralazine 25 mg 3 times daily, Imdur 30 mg daily, Toprol-XL 50 mg daily.  BP mildly elevated, will increase metoprolol as above  Hyperlipidemia: On atorvastatin 40 mg daily  RTC in 1 month  Medication Adjustments/Labs and Tests Ordered: Current medicines are reviewed at length with the patient today.  Concerns regarding medicines are outlined above.  No orders of the defined types were placed in this encounter.  Meds ordered this encounter  Medications   metoprolol succinate (TOPROL-XL) 50 MG 24 hr tablet    Sig: Take 1.5 tablets (75 mg total) by mouth at bedtime. Take with or immediately following a meal.    Dispense:  135 tablet    Refill:  3    Patient Instructions  Medication Instructions:  INCREASE metoprolol succinate (Toprol XL) to 75 mg daily  *If you need a refill on your cardiac medications before your next appointment, please call your pharmacy*   Lab Work: BMET today  If you have labs (blood work) drawn today and your tests are completely normal, you will receive your results only by: MyChart Message (if you have MyChart) OR A paper copy in the mail If you have any lab test that is abnormal or we need to change your treatment, we will call you to review the results.  Follow-Up: At [facility], you and your health needs are our priority.  As part of our continuing mission to provide you with exceptional heart care, we have created designated Provider Care Teams.  These Care Teams include your primary Cardiologist (physician) and Advanced Practice Providers (APPs -  Physician Assistants and Nurse Practitioners) who all work together to provide you  with the care you need, when you need it.  We recommend signing up for the patient portal called "MyChart".  Sign up information is provided on this After Visit Summary.  MyChart is used to connect with patients for Virtual Visits (Telemedicine).  Patients are able to view lab/test results, encounter notes, upcoming appointments, etc.  Non-urgent messages can be sent to your provider as well.   To learn more about what you can do with MyChart, go to [email or web address].    Your next appointment:   1 month(s)  Provider:   Dr. Bjorn Pippin   Signed, Little Ishikawa, MD  01/15/2023 11:01 AM    [facility]

## 2023-01-14 ENCOUNTER — Encounter: Payer: Self-pay | Admitting: Cardiology

## 2023-01-14 ENCOUNTER — Ambulatory Visit: Payer: PPO | Attending: Cardiology | Admitting: Cardiology

## 2023-01-14 VITALS — BP 148/68 | HR 83 | Ht 72.0 in | Wt 134.0 lb

## 2023-01-14 DIAGNOSIS — I10 Essential (primary) hypertension: Secondary | ICD-10-CM | POA: Diagnosis not present

## 2023-01-14 DIAGNOSIS — N179 Acute kidney failure, unspecified: Secondary | ICD-10-CM

## 2023-01-14 DIAGNOSIS — Z79899 Other long term (current) drug therapy: Secondary | ICD-10-CM

## 2023-01-14 DIAGNOSIS — I5022 Chronic systolic (congestive) heart failure: Secondary | ICD-10-CM | POA: Diagnosis not present

## 2023-01-14 DIAGNOSIS — I4892 Unspecified atrial flutter: Secondary | ICD-10-CM

## 2023-01-14 MED ORDER — METOPROLOL SUCCINATE ER 50 MG PO TB24: 75.0000 mg | ORAL_TABLET | Freq: Every day | ORAL | 3 refills | Status: DC

## 2023-01-14 NOTE — Patient Instructions (Addendum)
Medication Instructions:  INCREASE metoprolol succinate (Toprol XL) to 75 mg daily  *If you need a refill on your cardiac medications before your next appointment, please call your pharmacy*   Lab Work: BMET today  If you have labs (blood work) drawn today and your tests are completely normal, you will receive your results only by: MyChart Message (if you have MyChart) OR A paper copy in the mail If you have any lab test that is abnormal or we need to change your treatment, we will call you to review the results.  Follow-Up: At [facility], you and your health needs are our priority.  As part of our continuing mission to provide you with exceptional heart care, we have created designated Provider Care Teams.  These Care Teams include your primary Cardiologist (physician) and Advanced Practice Providers (APPs -  Physician Assistants and Nurse Practitioners) who all work together to provide you with the care you need, when you need it.  We recommend signing up for the patient portal called "MyChart".  Sign up information is provided on this After Visit Summary.  MyChart is used to connect with patients for Virtual Visits (Telemedicine).  Patients are able to view lab/test results, encounter notes, upcoming appointments, etc.  Non-urgent messages can be sent to your provider as well.   To learn more about what you can do with MyChart, go to [email or web address].    Your next appointment:   1 month(s)  Provider:   Dr. Bjorn Pippin

## 2023-01-15 ENCOUNTER — Encounter: Payer: Self-pay | Admitting: *Deleted

## 2023-01-15 ENCOUNTER — Telehealth: Payer: Self-pay | Admitting: *Deleted

## 2023-01-15 LAB — BASIC METABOLIC PANEL
BUN/Creatinine Ratio: 8 — ABNORMAL LOW (ref 10–24)
BUN: 9 mg/dL (ref 8–27)
CO2: 25 mmol/L (ref 20–29)
Calcium: 9.4 mg/dL (ref 8.6–10.2)
Chloride: 102 mmol/L (ref 96–106)
Creatinine, Ser: 1.07 mg/dL (ref 0.76–1.27)
Glucose: 58 mg/dL — ABNORMAL LOW (ref 70–99)
Potassium: 4.4 mmol/L (ref 3.5–5.2)
Sodium: 140 mmol/L (ref 134–144)
eGFR: 76 mL/min/{1.73_m2} (ref >59)

## 2023-01-15 NOTE — Telephone Encounter (Signed)
Little Ishikawa, MD 01/15/2023  6:05 AM EDT Back to Top    Kidney function is back to normal.  Can we schedule RHC/LHC?  I will call patient to consent    Procedure scheduled, instructions reviewed with patient.  Patient verbalized understanding.           Cardiac/Peripheral Catheterization   You are scheduled for a Cardiac Catheterization on Thursday, May 2 with Dr. Nicki Guadalajara.  1. Please arrive at the [facility] (Main Entrance A) at [facility]: [address] [place], [place] [address] at 7:00 AM (This time is 2 hour(s) before your procedure to ensure your preparation). Free valet parking service is available. You will check in at ADMITTING. The support person will be asked to wait in the waiting room.  It is OK to have someone drop you off and come back when you are ready to be discharged.        Special note: Every effort is made to have your procedure done on time. Please understand that emergencies sometimes delay scheduled procedures.  2. Diet: Do not eat solid foods after midnight.  You may have clear liquids until 5 AM the day of the procedure.  3. Labs: completed  4. Medication instructions in preparation for your procedure:   Contrast Allergy: No  Stop taking Eliquis (Apixiban) on Tuesday, April 30.  On the morning of your procedure, take Aspirin 81 mg and any morning medicines NOT listed above.  You may use sips of water.  5. Plan to go home the same day, you will only stay overnight if medically necessary. 6. You MUST have a responsible adult to drive you home. 7. An adult MUST be with you the first 24 hours after you arrive home. 8. Bring a current list of your medications, and the last time and date medication taken. 9. Bring ID and current insurance cards. 10.Please wear clothes that are easy to get on and off and wear slip-on shoes.  Thank you for allowing [place] to care for you!   -- [facility]   Follow  up scheduled 5/31 at 9:30 AM with Dr. Bjorn Pippin

## 2023-01-16 ENCOUNTER — Telehealth: Payer: Self-pay | Admitting: *Deleted

## 2023-01-16 NOTE — Telephone Encounter (Signed)
Cardiac Catheterization scheduled at [facility] for: Thursday Jan 17, 2023 9:30 AM Arrival time [facility] Main Entrance A at: 7:30 AM  Nothing to eat after midnight prior to procedure, clear liquids until 5 AM day of procedure.  Medication instructions: -Hold:  Eliquis-last dose AM 01/15/23 until post procedure -Other usual morning medications can be taken with sips of water including aspirin 81 mg.  Confirmed patient has responsible adult to drive home post procedure and be with patient first 24 hours after arriving home.  Plan to go home the same day, you will only stay overnight if medically necessary.  Reviewed procedure instructions with patient.

## 2023-01-17 ENCOUNTER — Other Ambulatory Visit: Payer: Self-pay

## 2023-01-17 ENCOUNTER — Ambulatory Visit (HOSPITAL_COMMUNITY)
Admission: RE | Admit: 2023-01-17 | Discharge: 2023-01-17 | Disposition: A | Discharge status: Home / Self Care | Payer: PPO | Attending: Cardiovascular Disease | Admitting: Cardiovascular Disease

## 2023-01-17 ENCOUNTER — Encounter (HOSPITAL_COMMUNITY)
Admission: RE | Disposition: A | Discharge status: Home / Self Care | Payer: Self-pay | Source: Home / Self Care | Attending: Cardiovascular Disease

## 2023-01-17 DIAGNOSIS — Z87891 Personal history of nicotine dependence: Secondary | ICD-10-CM | POA: Insufficient documentation

## 2023-01-17 DIAGNOSIS — I4892 Unspecified atrial flutter: Secondary | ICD-10-CM | POA: Insufficient documentation

## 2023-01-17 DIAGNOSIS — I2510 Atherosclerotic heart disease of native coronary artery without angina pectoris: Secondary | ICD-10-CM | POA: Diagnosis not present

## 2023-01-17 DIAGNOSIS — I5042 Chronic combined systolic (congestive) and diastolic (congestive) heart failure: Secondary | ICD-10-CM | POA: Diagnosis not present

## 2023-01-17 DIAGNOSIS — Z79899 Other long term (current) drug therapy: Secondary | ICD-10-CM | POA: Diagnosis not present

## 2023-01-17 DIAGNOSIS — I471 Supraventricular tachycardia, unspecified: Secondary | ICD-10-CM | POA: Insufficient documentation

## 2023-01-17 DIAGNOSIS — Z7901 Long term (current) use of anticoagulants: Secondary | ICD-10-CM | POA: Diagnosis not present

## 2023-01-17 DIAGNOSIS — E785 Hyperlipidemia, unspecified: Secondary | ICD-10-CM | POA: Diagnosis not present

## 2023-01-17 DIAGNOSIS — I4891 Unspecified atrial fibrillation: Secondary | ICD-10-CM | POA: Insufficient documentation

## 2023-01-17 DIAGNOSIS — I110 Hypertensive heart disease with heart failure: Secondary | ICD-10-CM | POA: Diagnosis not present

## 2023-01-17 HISTORY — PX: RIGHT/LEFT HEART CATH AND CORONARY ANGIOGRAPHY: CATH118266

## 2023-01-17 LAB — POCT I-STAT 7, (LYTES, BLD GAS, ICA,H+H)
Acid-Base Excess: 0 mmol/L (ref 0.0–2.0)
Bicarbonate: 25.2 mmol/L (ref 20.0–28.0)
Calcium, Ion: 1.29 mmol/L (ref 1.15–1.40)
HCT: 41 % (ref 39.0–52.0)
Hemoglobin: 13.9 g/dL (ref 13.0–17.0)
O2 Saturation: 97 %
Potassium: 3.9 mmol/L (ref 3.5–5.1)
Sodium: 141 mmol/L (ref 135–145)
TCO2: 27 mmol/L (ref 22–32)
pCO2 arterial: 42.7 mmHg (ref 32–48)
pH, Arterial: 7.379 (ref 7.35–7.45)
pO2, Arterial: 90 mmHg (ref 83–108)

## 2023-01-17 LAB — POCT I-STAT EG7
Acid-Base Excess: 0 mmol/L (ref 0.0–2.0)
Acid-Base Excess: 0 mmol/L (ref 0.0–2.0)
Bicarbonate: 26.2 mmol/L (ref 20.0–28.0)
Bicarbonate: 26.6 mmol/L (ref 20.0–28.0)
Calcium, Ion: 1.24 mmol/L (ref 1.15–1.40)
Calcium, Ion: 1.29 mmol/L (ref 1.15–1.40)
HCT: 40 % (ref 39.0–52.0)
HCT: 41 % (ref 39.0–52.0)
Hemoglobin: 13.6 g/dL (ref 13.0–17.0)
Hemoglobin: 13.9 g/dL (ref 13.0–17.0)
O2 Saturation: 65 %
O2 Saturation: 67 %
Potassium: 3.8 mmol/L (ref 3.5–5.1)
Potassium: 3.9 mmol/L (ref 3.5–5.1)
Sodium: 140 mmol/L (ref 135–145)
Sodium: 141 mmol/L (ref 135–145)
TCO2: 28 mmol/L (ref 22–32)
TCO2: 28 mmol/L (ref 22–32)
pCO2, Ven: 47.9 mmHg (ref 44–60)
pCO2, Ven: 48.2 mmHg (ref 44–60)
pH, Ven: 7.347 (ref 7.25–7.43)
pH, Ven: 7.349 (ref 7.25–7.43)
pO2, Ven: 36 mmHg (ref 32–45)
pO2, Ven: 37 mmHg (ref 32–45)

## 2023-01-17 SURGERY — RIGHT/LEFT HEART CATH AND CORONARY ANGIOGRAPHY
Anesthesia: LOCAL

## 2023-01-17 MED ORDER — VERAPAMIL HCL 2.5 MG/ML IV SOLN
INTRAVENOUS | Status: DC | PRN
  Administered 2023-01-17: 10 mL via INTRA_ARTERIAL

## 2023-01-17 MED ORDER — HEPARIN SODIUM (PORCINE) 1000 UNIT/ML IJ SOLN
INTRAMUSCULAR | Status: DC | PRN
  Administered 2023-01-17: 3000 [IU] via INTRAVENOUS

## 2023-01-17 MED ORDER — SODIUM CHLORIDE 0.9% FLUSH: 3.0000 mL | INTRAVENOUS | Status: DC | PRN

## 2023-01-17 MED ORDER — ONDANSETRON HCL 4 MG/2ML IJ SOLN: 4.0000 mg | Freq: Four times a day (QID) | INTRAMUSCULAR | Status: DC | PRN

## 2023-01-17 MED ORDER — MIDAZOLAM HCL 2 MG/2ML IJ SOLN
INTRAMUSCULAR | Status: DC | PRN
  Administered 2023-01-17: 2 mg via INTRAVENOUS

## 2023-01-17 MED ORDER — HYDRALAZINE HCL 20 MG/ML IJ SOLN: 10.0000 mg | INTRAMUSCULAR | Status: DC | PRN

## 2023-01-17 MED ORDER — FENTANYL CITRATE (PF) 100 MCG/2ML IJ SOLN
INTRAMUSCULAR | Status: DC | PRN
  Administered 2023-01-17: 25 ug via INTRAVENOUS

## 2023-01-17 MED ORDER — LIDOCAINE HCL (PF) 1 % IJ SOLN
INTRAMUSCULAR | Status: DC | PRN
  Administered 2023-01-17 (×2): 2 mL

## 2023-01-17 MED ORDER — SODIUM CHLORIDE 0.9 % IV SOLN: INTRAVENOUS | Status: DC

## 2023-01-17 MED ORDER — MIDAZOLAM HCL 2 MG/2ML IJ SOLN
INTRAMUSCULAR | Status: AC
  Filled 2023-01-17: qty 2

## 2023-01-17 MED ORDER — SODIUM CHLORIDE 0.9% FLUSH: 3.0000 mL | Freq: Two times a day (BID) | INTRAVENOUS | Status: DC

## 2023-01-17 MED ORDER — IOHEXOL 350 MG/ML SOLN
INTRAVENOUS | Status: DC | PRN
  Administered 2023-01-17: 70 mL

## 2023-01-17 MED ORDER — HEPARIN SODIUM (PORCINE) 1000 UNIT/ML IJ SOLN
INTRAMUSCULAR | Status: AC
  Filled 2023-01-17: qty 10

## 2023-01-17 MED ORDER — HEPARIN (PORCINE) IN NACL 1000-0.9 UT/500ML-% IV SOLN
INTRAVENOUS | Status: DC | PRN
  Administered 2023-01-17 (×2): 500 mL

## 2023-01-17 MED ORDER — SODIUM CHLORIDE 0.9 % IV SOLN: 250.0000 mL | INTRAVENOUS | Status: DC | PRN

## 2023-01-17 MED ORDER — ACETAMINOPHEN 325 MG PO TABS: 650.0000 mg | ORAL_TABLET | ORAL | Status: DC | PRN

## 2023-01-17 MED ORDER — LIDOCAINE HCL (PF) 1 % IJ SOLN
INTRAMUSCULAR | Status: AC
  Filled 2023-01-17: qty 30

## 2023-01-17 MED ORDER — SODIUM CHLORIDE 0.9 % WEIGHT BASED INFUSION
3.0000 mL/kg/h | INTRAVENOUS | Status: AC
  Administered 2023-01-17: 3 mL/kg/h via INTRAVENOUS

## 2023-01-17 MED ORDER — SODIUM CHLORIDE 0.9 % WEIGHT BASED INFUSION: 1.0000 mL/kg/h | INTRAVENOUS | Status: DC

## 2023-01-17 MED ORDER — LABETALOL HCL 5 MG/ML IV SOLN: 10.0000 mg | INTRAVENOUS | Status: DC | PRN

## 2023-01-17 MED ORDER — FENTANYL CITRATE (PF) 100 MCG/2ML IJ SOLN
INTRAMUSCULAR | Status: AC
  Filled 2023-01-17: qty 2

## 2023-01-17 MED ORDER — VERAPAMIL HCL 2.5 MG/ML IV SOLN
INTRAVENOUS | Status: AC
  Filled 2023-01-17: qty 2

## 2023-01-17 MED ORDER — ASPIRIN 81 MG PO CHEW: 81.0000 mg | CHEWABLE_TABLET | ORAL | Status: DC

## 2023-01-17 SURGICAL SUPPLY — 14 items
CATH BALLN WEDGE 5F 110CM (CATHETERS) IMPLANT
CATH INFINITI JR4 5F (CATHETERS) IMPLANT
CATH OPTITORQUE TIG 4.0 5F (CATHETERS) IMPLANT
DEVICE RAD TR BAND REGULAR (VASCULAR PRODUCTS) IMPLANT
ELECT DEFIB PAD ADLT CADENCE (PAD) IMPLANT
GLIDESHEATH SLEND SS 6F .021 (SHEATH) IMPLANT
GUIDEWIRE INQWIRE 1.5J.035X260 (WIRE) IMPLANT
INQWIRE 1.5J .035X260CM (WIRE) ×1
KIT HEART LEFT (KITS) ×1 IMPLANT
PACK CARDIAC CATHETERIZATION (CUSTOM PROCEDURE TRAY) ×1 IMPLANT
SHEATH GLIDE SLENDER 4/5FR (SHEATH) IMPLANT
SHEATH PROBE COVER 6X72 (BAG) IMPLANT
TRANSDUCER W/STOPCOCK (MISCELLANEOUS) ×1 IMPLANT
TUBING CIL FLEX 10 FLL-RA (TUBING) ×1 IMPLANT

## 2023-01-17 NOTE — Discharge Instructions (Signed)

## 2023-01-17 NOTE — Interval H&P Note (Signed)
Cath Lab Visit (complete for each Cath Lab visit)  Clinical Evaluation Leading to the Procedure:   ACS: No.  Non-ACS:    Anginal Classification: CCS II  Anti-ischemic medical therapy: Maximal Therapy (2 or more classes of medications)  Non-Invasive Test Results: No non-invasive testing performed  Prior CABG: No previous CABG      History and Physical Interval Note:  01/17/2023 9:36 AM  Curtis Henry  has presented today for surgery, with the diagnosis of heart ailure.  The various methods of treatment have been discussed with the patient and family. After consideration of risks, benefits and other options for treatment, the patient has consented to  Procedure(s): RIGHT/LEFT HEART CATH AND CORONARY ANGIOGRAPHY (N/A) as a surgical intervention.  The patient's history has been reviewed, patient examined, no change in status, stable for surgery.  I have reviewed the patient's chart and labs.  Questions were answered to the patient's satisfaction.     Nicki Guadalajara

## 2023-01-18 ENCOUNTER — Encounter (HOSPITAL_COMMUNITY): Payer: Self-pay | Admitting: Cardiovascular Disease

## 2023-01-25 ENCOUNTER — Other Ambulatory Visit: Payer: Self-pay | Admitting: Student

## 2023-01-25 DIAGNOSIS — E785 Hyperlipidemia, unspecified: Secondary | ICD-10-CM

## 2023-02-14 NOTE — Progress Notes (Signed)
Cardiology Office Note:    Date:  02/15/2023   ID:  HOLSTON GARTMAN, DOB December 11, 1955, MRN [id number]  PCP:  Erick Alley, DO  Cardiologist:  Little Ishikawa, MD  Electrophysiologist:  None   Referring MD: Erick Alley, DO   Chief Complaint  Patient presents with   Congestive Heart Failure    History of Present Illness:    Curtis Henry is a 67 y.o. male with a hx of chronic systolic heart failure, atrial fibrillation/flutter, hypertension, hyperlipidemia, squamous cell lung cancer who presents for follow-up.  He was referred by Dr. Yetta Barre for evaluation of chest pain, initially seen 01/01/2023.  He reports that he has been having chest pain over the last 2 to 3 months.  Reports 2 types of chest pain.  First is in center of his chest and feels like burning, seems related to eating certain foods.  Also reports pain on right side of chest when he is stressed.  No clear relationship with exertion.  Does report that he gets short of breath and has some lightheadedness with exertion.  Reports palpitations where he feels like heart is racing.  Had syncopal episode 5 years ago, none since.  Reports no lower extremity edema.  Reports compliance with Eliquis, denies any bleeding issues.  He smoked for 45 years, about 1 pack/day, quit in 2022.  Quit marijuana use recently.  Occasional alcohol use, less than 1 drink per week.  No family history of heart disease  Zio patch x 14 days 10/2022 showed 1 episode of NSVT lasting 5 beats, 01/22/2020 episodes of SVT with longest lasting 1 minute 37 seconds with average rate 173 bpm, atrial flutter with 2% burden with average rate 140 bpm, longest episode lasting 35 minutes 52 seconds with average rate 149 bpm, frequent PACs (7.7% of beats).  Echocardiogram 12/21/2022 showed EF 40 to 45%, global hypokinesis, indeterminate diastolic function, mildly reduced RV function.  At initial clinic visit, cardiac cath was ordered for workup of chest pain and systolic heart  failure.  However his creatinine was 1.85 on 01/01/2023 (increased from 1.07 on 11/28/2022), so catheterization was canceled.  HCTZ and olmesartan were discontinued.  Recheck BMET on 01/14/2023 showed creatinine down to 1.07.  Underwent RHC/LHC on 01/17/2023, which showed mild nonobstructive CAD (20% ostial left main stenosis, 30% ramus), low normal right heart pressures (RA 1, RV 22/1, PA 23/6/16, PCWP 5, CI 2.2).  Since last clinic visit, he reports he is doing okay.  States that lightheadedness and palpitations have improved.  Continues to have some shortness of breath.  Denies any syncope.  Reports some swelling in his feet.   Wt Readings from Last 3 Encounters:  02/15/23 128 lb 12.8 oz (58.4 kg)  01/17/23 134 lb (60.8 kg)  01/14/23 134 lb (60.8 kg)     Past Medical History:  Diagnosis Date   Anxiety state 03/08/2018   Bursitis of left shoulder    Essential hypertension 01/31/2018   In 2019, has not been on medication since this time.   GERD (gastroesophageal reflux disease)    Hyperlipidemia    Restless leg syndrome    Squamous cell lung cancer [facility])     Past Surgical History:  Procedure Laterality Date   CIRCUMCISION  12/13/2003   COLONOSCOPY     RIGHT/LEFT HEART CATH AND CORONARY ANGIOGRAPHY N/A 01/17/2023   Procedure: RIGHT/LEFT HEART CATH AND CORONARY ANGIOGRAPHY;  Surgeon: Lennette Bihari, MD;  Location: [facility];  Service: Cardiovascular;  Laterality: N/A;  VIDEO ASSISTED THORACOSCOPY (VATS)/ LOBECTOMY Left 01/17/2018   Procedure: left VIDEO ASSISTED THORACOSCOPY (VATS) with left lower lobe wedge resection and LOBECTOMY;  Surgeon: Kerin Perna, MD;  Location: [facility] OR;  Service: Thoracic;  Laterality: Left;    Current Medications: Current Meds  Medication Sig   acetaminophen (TYLENOL) 500 MG tablet Take 1,000 mg by mouth every 6 (six) hours as needed for moderate pain.   apixaban (ELIQUIS) 5 MG TABS tablet Take 1 tablet (5 mg total) by mouth 2 (two) times daily.    atorvastatin (LIPITOR) 40 MG tablet Take 1 tablet by mouth once daily   betamethasone valerate ointment (VALISONE) 0.1 % Apply 1 application topically 2 (two) times daily. (Patient taking differently: Apply 1 application  topically daily.)   gabapentin (NEURONTIN) 600 MG tablet TAKE 1 TABLET BY MOUTH AT BEDTIME   hydrALAZINE (APRESOLINE) 25 MG tablet Take 1 tablet (25 mg total) by mouth 3 (three) times daily.   isosorbide mononitrate (IMDUR) 30 MG 24 hr tablet Take 1 tablet (30 mg total) by mouth daily.   losartan (COZAAR) 25 MG tablet Take 1 tablet (25 mg total) by mouth daily.   metoprolol succinate (TOPROL-XL) 50 MG 24 hr tablet Take 1.5 tablets (75 mg total) by mouth at bedtime. Take with or immediately following a meal.   Multiple Vitamin (MULTIVITAMIN) tablet Take 1 tablet by mouth daily.   Omega-3 Fatty Acids (FISH OIL) 1200 MG CAPS Take 1,200 mg by mouth daily.   [DISCONTINUED] amLODipine (NORVASC) 10 MG tablet Take 1 tablet (10 mg total) by mouth daily. (Patient taking differently: Take 10 mg by mouth at bedtime.)     Allergies:   Lactose intolerance (gi)   Social History   Socioeconomic History   Marital status: Widowed    Spouse name: Not on file   Number of children: 5   Years of education: Not on file   Highest education level: Not on file  Occupational History   Occupation: [employer]  Tobacco Use   Smoking status: Former    Packs/day: 0.10    Years: 42.00    Additional pack years: 0.00    Total pack years: 4.20    Types: Cigarettes    Start date: 09/17/1974    Quit date: 2022    Years since quitting: 2.4   Smokeless tobacco: Never  Vaping Use   Vaping Use: Some days   Substances: Nicotine, Flavoring  Substance and Sexual Activity   Alcohol use: Not Currently   Drug use: Yes    Frequency: 7.0 times per week    Types: Marijuana    Comment: smokes marijuana; crack use distant past >30 yrs ago   Sexual activity: Not Currently  Other Topics Concern   Not on file   Social History Narrative   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    Social Determinants of Health   Financial Resource Strain: Medium Risk (12/25/2022)   Overall Financial Resource Strain (CARDIA)    Difficulty of Paying Living Expenses: Somewhat hard  Food Insecurity: Food Insecurity Present (12/25/2022)   Hunger Vital Sign    Worried About Running Out of Food in the Last Year: Sometimes true    Ran Out of Food in the Last Year: Sometimes true  Transportation Needs: No Transportation Needs (12/25/2022)   PRAPARE - Administrator, Civil Service (Medical): No    Lack of Transportation (Non-Medical): No  Physical Activity: Inactive (12/25/2022)   Exercise Vital Sign  Days of Exercise per Week: 0 days    Minutes of Exercise per Session: 0 min  Stress: Stress Concern Present (12/25/2022)   [employer] of Occupational Health - Occupational Stress Questionnaire    Feeling of Stress : To some extent  Social Connections: Socially Isolated (12/25/2022)   Social Connection and Isolation Panel [NHANES]    Frequency of Communication with Friends and Family: More than three times a week    Frequency of Social Gatherings with Friends and Family: More than three times a week    Attends Religious Services: Never    Database administrator or Organizations: No    Attends Banker Meetings: Never    Marital Status: Widowed     Family History: The patient's family history includes Arthritis in an other family member; Colon cancer (age of onset: 51) in his father; Esophageal cancer in his brother.  ROS:   Please see the history of present illness.     All other systems reviewed and are negative.  EKGs/Labs/Other Studies Reviewed:    The following studies were reviewed today:   EKG:   01/01/2023: Normal sinus rhythm, rate 65, no ST abnormalities 02/15/2023: Normal sinus rhythm, rate 61, no ST abnormalities  Recent Labs: 09/14/2022: TSH 0.887 11/28/2022: ALT  18 01/01/2023: Magnesium 2.3; Platelets 225 01/14/2023: BUN 9; Creatinine, Ser 1.07 01/17/2023: Hemoglobin 13.9; Potassium 3.9; Sodium 141  Recent Lipid Panel    Component Value Date/Time   CHOL 187 03/22/2020 1708   TRIG 119 03/22/2020 1708   HDL 50 03/22/2020 1708   CHOLHDL 3.7 03/22/2020 1708   CHOLHDL 3.9 04/18/2010 0545   VLDL 12 04/18/2010 0545   LDLCALC 116 (H) 03/22/2020 1708    Physical Exam:    VS:  BP 120/64 (BP Location: Left Arm, Patient Position: Sitting, Cuff Size: Small)   Pulse 61   Ht 6' (1.829 m)   Wt 128 lb 12.8 oz (58.4 kg)   SpO2 98%   BMI 17.47 kg/m     Wt Readings from Last 3 Encounters:  02/15/23 128 lb 12.8 oz (58.4 kg)  01/17/23 134 lb (60.8 kg)  01/14/23 134 lb (60.8 kg)     GEN:  Well nourished, well developed in no acute distress HEENT: Normal NECK: No JVD; No carotid bruits LYMPHATICS: No lymphadenopathy CARDIAC: RRR, no murmurs, rubs, gallops RESPIRATORY:  Clear to auscultation without rales, wheezing or rhonchi  ABDOMEN: Soft, non-tender, non-distended MUSCULOSKELETAL:  No edema; No deformity  SKIN: Warm and dry NEUROLOGIC:  Alert and oriented x 3 PSYCHIATRIC:  Normal affect   ASSESSMENT:    1. Chronic systolic heart failure (HCC)   2. Coronary artery disease involving native coronary artery of native heart without angina pectoris   3. AKI (acute kidney injury) (HCC)   4. Atrial flutter, unspecified type (HCC)   5. SVT (supraventricular tachycardia)   6. Essential hypertension   7. Hyperlipidemia, unspecified hyperlipidemia type     PLAN:    Chronic systolic heart failure:Echocardiogram 12/21/2022 showed EF 40 to 45%, global hypokinesis, indeterminate diastolic function, mildly reduced RV function.  Reported atypical chest pain.  Underwent RHC/LHC on 01/17/2023, which showed mild nonobstructive CAD (20% ostial left main stenosis, 30% ramus), low normal right heart pressures (RA 1, RV 22/1, PA 23/6/16, PCWP 5, CI 2.2). -Continue  Toprol-XL 75 mg daily -Continue hydralazine 25 mg 3 times daily and Imdur 30 mg daily -Add losartan 25 mg daily.  Check BMET -Cardiac MRI to evaluate etiology of nonischemic  cardiomyopathy  CAD: mild nonobstructive CAD (20% ostial left main stenosis, 30% ramus) on cath 01/17/2023 -Continue Eliquis -On atorvastatin 40 mg daily.  Check lipid panel  AKI: creatinine was 1.85 on 01/01/2023 (increased from 1.07 on 11/28/2022).  HCTZ and olmesartan were discontinued.  Recheck BMET on 01/11/2023 showed improvement in creatinine to 1.07.  Check BMET  Atrial flutter: Zio patch x 14 days 10/2022 showed 1 episode of NSVT lasting 5 beats, 01/22/2020 episodes of SVT with longest lasting 1 minute 37 seconds with average rate 173 bpm, atrial flutter with 2% burden with average rate 140 bpm, longest episode lasting 35 minutes 52 seconds with average rate 149 bpm, frequent PACs (7.7% of beats).   -CHA2DS2-VASc 3 (CHF, hypertension, age).  Continue Eliquis 5 mg twice daily -Continue Toprol-XL 75 mg daily  SVT/frequent PACs: ZIO monitor results 10/2022 as above.  Continue Toprol-XL 75 mg daily  Hypertension: On amlodipine 10 mg daily, hydralazine 25 mg 3 times daily, Imdur 30 mg daily, Toprol-XL 75mg  daily.  Will plan to wean off amlodipine to titrate GDMT.  Will decrease amlodipine to 5 mg daily and add losartan 25 mg daily  Hyperlipidemia: On atorvastatin 40 mg daily.  Check lipid panel   RTC in 3 weeks  Medication Adjustments/Labs and Tests Ordered: Current medicines are reviewed at length with the patient today.  Concerns regarding medicines are outlined above.  Orders Placed This Encounter  Procedures   MR CARDIAC MORPHOLOGY W WO CONTRAST   Basic metabolic panel   Lipid panel   EKG 12-Lead   Meds ordered this encounter  Medications   amLODipine (NORVASC) 5 MG tablet    Sig: Take 1 tablet (5 mg total) by mouth daily.    Dispense:  90 tablet    Refill:  3    Dose decrease   losartan (COZAAR) 25 MG  tablet    Sig: Take 1 tablet (25 mg total) by mouth daily.    Dispense:  90 tablet    Refill:  3    Patient Instructions  Medication Instructions:  DECREASE amlodipine to 5 mg daily  START Losartan 25 mg daily  *If you need a refill on your cardiac medications before your next appointment, please call your pharmacy*  Lab Work: BMET, Lipid today  If you have labs (blood work) drawn today and your tests are completely normal, you will receive your results only by: MyChart Message (if you have MyChart) OR A paper copy in the mail If you have any lab test that is abnormal or we need to change your treatment, we will call you to review the results.  Testing/Procedures: Your physician has requested that you have a cardiac MRI. Cardiac MRI uses a computer to create images of your heart as its beating, producing both still and moving pictures of your heart and major blood vessels. For further information please visit [email or web address]. Please follow the instruction sheet given to you today for more information.  Follow-Up: At [facility], you and your health needs are our priority.  As part of our continuing mission to provide you with exceptional heart care, we have created designated Provider Care Teams.  These Care Teams include your primary Cardiologist (physician) and Advanced Practice Providers (APPs -  Physician Assistants and Nurse Practitioners) who all work together to provide you with the care you need, when you need it.  We recommend signing up for the patient portal called "MyChart".  Sign up information is provided on this After Visit Summary.  MyChart is used to connect with patients for Virtual Visits (Telemedicine).  Patients are able to view lab/test results, encounter notes, upcoming appointments, etc.  Non-urgent messages can be sent to your provider as well.   To learn more about what you can do with MyChart, go to [email or web address].    Your next  appointment:   6/17 at 10 AM with Dr. Bjorn Pippin  Other Instructions   You are scheduled for Cardiac MRI on ______________. Please arrive for your appointment at ______________ ( arrive 30-45 minutes prior to test start time). ?  [facility] [address] [place], [place] [address] [phone] Please take advantage of the free valet parking available at the MAIN entrance (A entrance).  Proceed to the [facility] Radiology Department (First Floor) for check-in.   OR   [facility] [address] [place], [place] [address] [phone] Please take advantage of the free valet parking available at the MAIN entrance. Proceed to [facility] registration for check-in (first floor).  Magnetic resonance imaging (MRI) is a painless test that produces images of the inside of the body without using Xrays.  During an MRI, strong magnets and radio waves work together in a Data processing manager to form detailed images.   MRI images may provide more details about a medical condition than X-rays, CT scans, and ultrasounds can provide.  You may be given earphones to listen for instructions.  You may eat a light breakfast and take medications as ordered with the exception of furosemide, hydrochlorothiazide, or spironolactone(fluid pill, other). Please avoid stimulants for 12 hr prior to test. (Ie. Caffeine, nicotine, chocolate, or antihistamine medications)  If a contrast material will be used, an IV will be inserted into one of your veins. Contrast material will be injected into your IV. It will leave your body through your urine within a day. You may be told to drink plenty of fluids to help flush the contrast material out of your system.  You will be asked to remove all metal, including: Watch, jewelry, and other metal objects including hearing aids, hair pieces and dentures. Also wearable glucose monitoring systems (ie. Freestyle Libre and Omnipods) (Braces and fillings  normally are not a problem.)   TEST WILL TAKE APPROXIMATELY 1 HOUR  PLEASE NOTIFY SCHEDULING AT LEAST 24 HOURS IN ADVANCE IF YOU ARE UNABLE TO KEEP YOUR APPOINTMENT. [phone]  Please call Rockwell Alexandria, cardiac imaging nurse navigator with any questions/concerns. Rockwell Alexandria RN Navigator Cardiac Imaging Larey Brick RN Navigator Cardiac Imaging [facility] Heart and Vascular Services [phone] Office      Signed, Little Ishikawa, MD  02/15/2023 10:09 AM    [facility]

## 2023-02-15 ENCOUNTER — Encounter: Payer: Self-pay | Admitting: Cardiology

## 2023-02-15 ENCOUNTER — Ambulatory Visit: Payer: PPO | Attending: Cardiology | Admitting: Cardiology

## 2023-02-15 VITALS — BP 120/64 | HR 61 | Ht 72.0 in | Wt 128.8 lb

## 2023-02-15 DIAGNOSIS — I10 Essential (primary) hypertension: Secondary | ICD-10-CM

## 2023-02-15 DIAGNOSIS — I5022 Chronic systolic (congestive) heart failure: Secondary | ICD-10-CM | POA: Diagnosis not present

## 2023-02-15 DIAGNOSIS — I2510 Atherosclerotic heart disease of native coronary artery without angina pectoris: Secondary | ICD-10-CM

## 2023-02-15 DIAGNOSIS — E785 Hyperlipidemia, unspecified: Secondary | ICD-10-CM

## 2023-02-15 DIAGNOSIS — I4892 Unspecified atrial flutter: Secondary | ICD-10-CM | POA: Diagnosis not present

## 2023-02-15 DIAGNOSIS — N179 Acute kidney failure, unspecified: Secondary | ICD-10-CM

## 2023-02-15 DIAGNOSIS — I471 Supraventricular tachycardia, unspecified: Secondary | ICD-10-CM

## 2023-02-15 MED ORDER — AMLODIPINE BESYLATE 5 MG PO TABS: 5.0000 mg | ORAL_TABLET | Freq: Every day | ORAL | 3 refills | Status: DC

## 2023-02-15 MED ORDER — LOSARTAN POTASSIUM 25 MG PO TABS: 25.0000 mg | ORAL_TABLET | Freq: Every day | ORAL | 3 refills | Status: DC

## 2023-02-15 NOTE — Patient Instructions (Signed)
Medication Instructions:  DECREASE amlodipine to 5 mg daily  START Losartan 25 mg daily  *If you need a refill on your cardiac medications before your next appointment, please call your pharmacy*  Lab Work: BMET, Lipid today  If you have labs (blood work) drawn today and your tests are completely normal, you will receive your results only by: MyChart Message (if you have MyChart) OR A paper copy in the mail If you have any lab test that is abnormal or we need to change your treatment, we will call you to review the results.  Testing/Procedures: Your physician has requested that you have a cardiac MRI. Cardiac MRI uses a computer to create images of your heart as its beating, producing both still and moving pictures of your heart and major blood vessels. For further information please visit [email or web address]. Please follow the instruction sheet given to you today for more information.  Follow-Up: At [facility], you and your health needs are our priority.  As part of our continuing mission to provide you with exceptional heart care, we have created designated Provider Care Teams.  These Care Teams include your primary Cardiologist (physician) and Advanced Practice Providers (APPs -  Physician Assistants and Nurse Practitioners) who all work together to provide you with the care you need, when you need it.  We recommend signing up for the patient portal called "MyChart".  Sign up information is provided on this After Visit Summary.  MyChart is used to connect with patients for Virtual Visits (Telemedicine).  Patients are able to view lab/test results, encounter notes, upcoming appointments, etc.  Non-urgent messages can be sent to your provider as well.   To learn more about what you can do with MyChart, go to [email or web address].    Your next appointment:   6/17 at 10 AM with Dr. Bjorn Pippin  Other Instructions   You are scheduled for Cardiac MRI on ______________. Please  arrive for your appointment at ______________ ( arrive 30-45 minutes prior to test start time). ?  [facility] [address] [place], [place] [address] [phone] Please take advantage of the free valet parking available at the MAIN entrance (A entrance).  Proceed to the [facility] Radiology Department (First Floor) for check-in.   OR   [facility] [address] [place], [place] [address] [phone] Please take advantage of the free valet parking available at the MAIN entrance. Proceed to [facility] registration for check-in (first floor).  Magnetic resonance imaging (MRI) is a painless test that produces images of the inside of the body without using Xrays.  During an MRI, strong magnets and radio waves work together in a Data processing manager to form detailed images.   MRI images may provide more details about a medical condition than X-rays, CT scans, and ultrasounds can provide.  You may be given earphones to listen for instructions.  You may eat a light breakfast and take medications as ordered with the exception of furosemide, hydrochlorothiazide, or spironolactone(fluid pill, other). Please avoid stimulants for 12 hr prior to test. (Ie. Caffeine, nicotine, chocolate, or antihistamine medications)  If a contrast material will be used, an IV will be inserted into one of your veins. Contrast material will be injected into your IV. It will leave your body through your urine within a day. You may be told to drink plenty of fluids to help flush the contrast material out of your system.  You will be asked to remove all metal, including: Watch,  jewelry, and other metal objects including hearing aids, hair pieces and dentures. Also wearable glucose monitoring systems (ie. Freestyle Libre and Omnipods) (Braces and fillings normally are not a problem.)   TEST WILL TAKE APPROXIMATELY 1 HOUR  PLEASE NOTIFY SCHEDULING AT LEAST 24 HOURS IN ADVANCE IF YOU ARE  UNABLE TO KEEP YOUR APPOINTMENT. [phone]  Please call Rockwell Alexandria, cardiac imaging nurse navigator with any questions/concerns. Rockwell Alexandria RN Navigator Cardiac Imaging Larey Brick RN Navigator Cardiac Imaging [facility] Heart and Vascular Services [phone] Office

## 2023-02-16 LAB — BASIC METABOLIC PANEL
BUN/Creatinine Ratio: 11 (ref 10–24)
BUN: 13 mg/dL (ref 8–27)
CO2: 27 mmol/L (ref 20–29)
Calcium: 9.8 mg/dL (ref 8.6–10.2)
Chloride: 101 mmol/L (ref 96–106)
Creatinine, Ser: 1.22 mg/dL (ref 0.76–1.27)
Glucose: 86 mg/dL (ref 70–99)
Potassium: 4.1 mmol/L (ref 3.5–5.2)
Sodium: 141 mmol/L (ref 134–144)
eGFR: 65 mL/min/{1.73_m2} (ref >59)

## 2023-02-16 LAB — LIPID PANEL
Chol/HDL Ratio: 2.6 ratio (ref 0.0–5.0)
Cholesterol, Total: 163 mg/dL (ref 100–199)
HDL: 62 mg/dL (ref >39)
LDL Chol Calc (NIH): 89 mg/dL (ref 0–99)
Triglycerides: 61 mg/dL (ref 0–149)
VLDL Cholesterol Cal: 12 mg/dL (ref 5–40)

## 2023-02-19 ENCOUNTER — Other Ambulatory Visit: Payer: Self-pay

## 2023-02-19 DIAGNOSIS — E785 Hyperlipidemia, unspecified: Secondary | ICD-10-CM

## 2023-02-19 DIAGNOSIS — Z79899 Other long term (current) drug therapy: Secondary | ICD-10-CM

## 2023-02-19 DIAGNOSIS — I10 Essential (primary) hypertension: Secondary | ICD-10-CM

## 2023-02-19 MED ORDER — ATORVASTATIN CALCIUM 80 MG PO TABS
80.0000 mg | ORAL_TABLET | Freq: Every day | ORAL | 1 refills | Status: DC
Start: 2023-02-19 — End: 2023-12-26

## 2023-02-20 ENCOUNTER — Other Ambulatory Visit: Payer: Self-pay

## 2023-02-20 DIAGNOSIS — Z79899 Other long term (current) drug therapy: Secondary | ICD-10-CM

## 2023-02-20 DIAGNOSIS — I10 Essential (primary) hypertension: Secondary | ICD-10-CM

## 2023-03-03 NOTE — Progress Notes (Unsigned)
Cardiology Office Note:    Date:  03/04/2023   ID:  Curtis Henry, DOB 09/25/1955, MRN [id number]  PCP:  Erick Alley, DO  Cardiologist:  Little Ishikawa, MD  Electrophysiologist:  None   Referring MD: Erick Alley, DO   Chief Complaint  Patient presents with   Congestive Heart Failure    History of Present Illness:    Curtis Henry is a 67 y.o. male with a hx of chronic systolic heart failure, atrial fibrillation/flutter, hypertension, hyperlipidemia, squamous cell lung cancer who presents for follow-up.  He was referred by Dr. Yetta Barre for evaluation of chest pain, initially seen 01/01/2023.  He reports that he has been having chest pain over the last 2 to 3 months.  Reports 2 types of chest pain.  First is in center of his chest and feels like burning, seems related to eating certain foods.  Also reports pain on right side of chest when he is stressed.  No clear relationship with exertion.  Does report that he gets short of breath and has some lightheadedness with exertion.  Reports palpitations where he feels like heart is racing.  Had syncopal episode 5 years ago, none since.  Reports no lower extremity edema.  Reports compliance with Eliquis, denies any bleeding issues.  He smoked for 45 years, about 1 pack/day, quit in 2022.  Quit marijuana use recently.  Occasional alcohol use, less than 1 drink per week.  No family history of heart disease  Zio patch x 14 days 10/2022 showed 1 episode of NSVT lasting 5 beats, 01/22/2020 episodes of SVT with longest lasting 1 minute 37 seconds with average rate 173 bpm, atrial flutter with 2% burden with average rate 140 bpm, longest episode lasting 35 minutes 52 seconds with average rate 149 bpm, frequent PACs (7.7% of beats).  Echocardiogram 12/21/2022 showed EF 40 to 45%, global hypokinesis, indeterminate diastolic function, mildly reduced RV function.  At initial clinic visit, cardiac cath was ordered for workup of chest pain and systolic heart  failure.  However his creatinine was 1.85 on 01/01/2023 (increased from 1.07 on 11/28/2022), so catheterization was canceled.  HCTZ and olmesartan were discontinued.  Recheck BMET on 01/14/2023 showed creatinine down to 1.07.  Underwent RHC/LHC on 01/17/2023, which showed mild nonobstructive CAD (20% ostial left main stenosis, 30% ramus), low normal right heart pressures (RA 1, RV 22/1, PA 23/6/16, PCWP 5, CI 2.2).  Since last clinic visit, he reports that he is doing well.  Reports lightheadedness and shortness of breath have improved.  States that palpitations have improved.  He denies any chest pain.  Denies any lower extremity edema.  Wt Readings from Last 3 Encounters:  03/04/23 128 lb 6.4 oz (58.2 kg)  02/15/23 128 lb 12.8 oz (58.4 kg)  01/17/23 134 lb (60.8 kg)     Past Medical History:  Diagnosis Date   Anxiety state 03/08/2018   Bursitis of left shoulder    Essential hypertension 01/31/2018   In 2019, has not been on medication since this time.   GERD (gastroesophageal reflux disease)    Hyperlipidemia    Restless leg syndrome    Squamous cell lung cancer [facility])     Past Surgical History:  Procedure Laterality Date   CIRCUMCISION  12/13/2003   COLONOSCOPY     RIGHT/LEFT HEART CATH AND CORONARY ANGIOGRAPHY N/A 01/17/2023   Procedure: RIGHT/LEFT HEART CATH AND CORONARY ANGIOGRAPHY;  Surgeon: Lennette Bihari, MD;  Location: [facility];  Service: Cardiovascular;  Laterality: N/A;   VIDEO ASSISTED THORACOSCOPY (VATS)/ LOBECTOMY Left 01/17/2018   Procedure: left VIDEO ASSISTED THORACOSCOPY (VATS) with left lower lobe wedge resection and LOBECTOMY;  Surgeon: Kerin Perna, MD;  Location: [facility] OR;  Service: Thoracic;  Laterality: Left;    Current Medications: Current Meds  Medication Sig   acetaminophen (TYLENOL) 500 MG tablet Take 1,000 mg by mouth every 6 (six) hours as needed for moderate pain.   apixaban (ELIQUIS) 5 MG TABS tablet Take 1 tablet (5 mg total) by mouth 2 (two)  times daily.   atorvastatin (LIPITOR) 80 MG tablet Take 1 tablet (80 mg total) by mouth daily.   betamethasone valerate ointment (VALISONE) 0.1 % Apply 1 application topically 2 (two) times daily. (Patient taking differently: Apply 1 application  topically daily.)   gabapentin (NEURONTIN) 600 MG tablet TAKE 1 TABLET BY MOUTH AT BEDTIME   hydrALAZINE (APRESOLINE) 25 MG tablet Take 1 tablet (25 mg total) by mouth 3 (three) times daily.   isosorbide mononitrate (IMDUR) 30 MG 24 hr tablet Take 1 tablet (30 mg total) by mouth daily.   losartan (COZAAR) 50 MG tablet Take 1 tablet (50 mg total) by mouth daily.   metoprolol succinate (TOPROL-XL) 50 MG 24 hr tablet Take 1.5 tablets (75 mg total) by mouth at bedtime. Take with or immediately following a meal.   Multiple Vitamin (MULTIVITAMIN) tablet Take 1 tablet by mouth daily.   Omega-3 Fatty Acids (FISH OIL) 1200 MG CAPS Take 1,200 mg by mouth daily.   [DISCONTINUED] amLODipine (NORVASC) 5 MG tablet Take 1 tablet (5 mg total) by mouth daily.   [DISCONTINUED] losartan (COZAAR) 25 MG tablet Take 1 tablet (25 mg total) by mouth daily.     Allergies:   Lactose intolerance (gi)   Social History   Socioeconomic History   Marital status: Widowed    Spouse name: Not on file   Number of children: 5   Years of education: Not on file   Highest education level: Not on file  Occupational History   Occupation: [employer]  Tobacco Use   Smoking status: Former    Packs/day: 0.10    Years: 42.00    Additional pack years: 0.00    Total pack years: 4.20    Types: Cigarettes    Start date: 09/17/1974    Quit date: 2022    Years since quitting: 2.4   Smokeless tobacco: Never  Vaping Use   Vaping Use: Some days   Substances: Nicotine, Flavoring  Substance and Sexual Activity   Alcohol use: Not Currently   Drug use: Yes    Frequency: 7.0 times per week    Types: Marijuana    Comment: smokes marijuana; crack use distant past >30 yrs ago   Sexual  activity: Not Currently  Other Topics Concern   Not on file  Social History Narrative   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    Social Determinants of Health   Financial Resource Strain: Medium Risk (12/25/2022)   Overall Financial Resource Strain (CARDIA)    Difficulty of Paying Living Expenses: Somewhat hard  Food Insecurity: Food Insecurity Present (12/25/2022)   Hunger Vital Sign    Worried About Running Out of Food in the Last Year: Sometimes true    Ran Out of Food in the Last Year: Sometimes true  Transportation Needs: No Transportation Needs (12/25/2022)   PRAPARE - Administrator, Civil Service (Medical): No    Lack of Transportation (Non-Medical): No  Physical Activity: Inactive (12/25/2022)   Exercise Vital Sign    Days of Exercise per Week: 0 days    Minutes of Exercise per Session: 0 min  Stress: Stress Concern Present (12/25/2022)   [employer] of Occupational Health - Occupational Stress Questionnaire    Feeling of Stress : To some extent  Social Connections: Socially Isolated (12/25/2022)   Social Connection and Isolation Panel [NHANES]    Frequency of Communication with Friends and Family: More than three times a week    Frequency of Social Gatherings with Friends and Family: More than three times a week    Attends Religious Services: Never    Database administrator or Organizations: No    Attends Banker Meetings: Never    Marital Status: Widowed     Family History: The patient's family history includes Arthritis in an other family member; Colon cancer (age of onset: 35) in his father; Esophageal cancer in his brother.  ROS:   Please see the history of present illness.     All other systems reviewed and are negative.  EKGs/Labs/Other Studies Reviewed:    The following studies were reviewed today:   EKG:   01/01/2023: Normal sinus rhythm, rate 65, no ST abnormalities 02/15/2023: Normal sinus rhythm, rate 61, no ST  abnormalities  Recent Labs: 09/14/2022: TSH 0.887 11/28/2022: ALT 18 01/01/2023: Magnesium 2.3; Platelets 225 01/17/2023: Hemoglobin 13.9 02/15/2023: BUN 13; Creatinine, Ser 1.22; Potassium 4.1; Sodium 141  Recent Lipid Panel    Component Value Date/Time   CHOL 163 02/15/2023 1017   TRIG 61 02/15/2023 1017   HDL 62 02/15/2023 1017   CHOLHDL 2.6 02/15/2023 1017   CHOLHDL 3.9 04/18/2010 0545   VLDL 12 04/18/2010 0545   LDLCALC 89 02/15/2023 1017    Physical Exam:    VS:  BP (!) 144/76 (BP Location: Left Arm, Patient Position: Sitting, Cuff Size: Normal)   Pulse (!) 52   Ht 6' (1.829 m)   Wt 128 lb 6.4 oz (58.2 kg)   SpO2 98%   BMI 17.41 kg/m     Wt Readings from Last 3 Encounters:  03/04/23 128 lb 6.4 oz (58.2 kg)  02/15/23 128 lb 12.8 oz (58.4 kg)  01/17/23 134 lb (60.8 kg)     GEN:  Well nourished, well developed in no acute distress HEENT: Normal NECK: No JVD; No carotid bruits LYMPHATICS: No lymphadenopathy CARDIAC: RRR, no murmurs, rubs, gallops RESPIRATORY:  Clear to auscultation without rales, wheezing or rhonchi  ABDOMEN: Soft, non-tender, non-distended MUSCULOSKELETAL:  No edema; No deformity  SKIN: Warm and dry NEUROLOGIC:  Alert and oriented x 3 PSYCHIATRIC:  Normal affect   ASSESSMENT:    1. Chronic systolic heart failure (HCC)   2. Coronary artery disease involving native coronary artery of native heart without angina pectoris   3. Atrial flutter, unspecified type (HCC)   4. Essential hypertension   5. Hyperlipidemia, unspecified hyperlipidemia type     PLAN:    Chronic systolic heart failure:Echocardiogram 12/21/2022 showed EF 40 to 45%, global hypokinesis, indeterminate diastolic function, mildly reduced RV function.  Reported atypical chest pain.  Underwent RHC/LHC on 01/17/2023, which showed mild nonobstructive CAD (20% ostial left main stenosis, 30% ramus), low normal right heart pressures (RA 1, RV 22/1, PA 23/6/16, PCWP 5, CI 2.2). -Continue  Toprol-XL 75 mg daily -Continue hydralazine 25 mg 3 times daily and Imdur 30 mg daily -Increase losartan to 50 mg daily.  Check BMET -Plan follow-up in pharmacy heart  failure clinic in 2 weeks.  Next step if stable renal function would be to add SGLT2 inhibitor or spironolactone, but will need to watch renal function given prior AKI on diuretic -Cardiac MRI to evaluate etiology of nonischemic cardiomyopathy  CAD: mild nonobstructive CAD (20% ostial left main stenosis, 30% ramus) on cath 01/17/2023 -Continue Eliquis -On atorvastatin 80 mg daily  AKI: creatinine was 1.85 on 01/01/2023 (increased from 1.07 on 11/28/2022).  HCTZ and olmesartan were discontinued.  Recheck BMET on 01/11/2023 showed improvement in creatinine to 1.07.  Check BMET  Atrial flutter: Zio patch x 14 days 10/2022 showed 1 episode of NSVT lasting 5 beats, 01/22/2020 episodes of SVT with longest lasting 1 minute 37 seconds with average rate 173 bpm, atrial flutter with 2% burden with average rate 140 bpm, longest episode lasting 35 minutes 52 seconds with average rate 149 bpm, frequent PACs (7.7% of beats).   -CHA2DS2-VASc 3 (CHF, hypertension, age).  Continue Eliquis 5 mg twice daily -Continue Toprol-XL 75 mg daily  SVT/frequent PACs: ZIO monitor results 10/2022 as above.  Continue Toprol-XL 75 mg daily  Hypertension: On amlodipine 5 mg daily, hydralazine 25 mg 3 times daily, Imdur 30 mg daily, Toprol-XL 75mg  daily.  Will plan to wean off amlodipine to titrate GDMT.  Will stop amlodipine and increase losartan to 50 mg daily  Hyperlipidemia: On atorvastatin 40 mg daily, LDL 89 on 02/15/2023.  Atorvastatin increased to 80 mg daily for goal LDL less than 70 given CAD as above   RTC in 2 months  Medication Adjustments/Labs and Tests Ordered: Current medicines are reviewed at length with the patient today.  Concerns regarding medicines are outlined above.  Orders Placed This Encounter  Procedures   Basic metabolic panel   AMB  Referral to Heartcare Pharm-D   EKG 12-Lead   Meds ordered this encounter  Medications   losartan (COZAAR) 50 MG tablet    Sig: Take 1 tablet (50 mg total) by mouth daily.    Dispense:  90 tablet    Refill:  3    Patient Instructions  Medication Instructions:  Stop Amlodipine Increase Losartan to 50 mg daily Continue all other medications *If you need a refill on your cardiac medications before your next appointment, please call your pharmacy*   Lab Work: Bmet today   Testing/Procedures: None ordered   Follow-Up: At [facility], you and your health needs are our priority.  As part of our continuing mission to provide you with exceptional heart care, we have created designated Provider Care Teams.  These Care Teams include your primary Cardiologist (physician) and Advanced Practice Providers (APPs -  Physician Assistants and Nurse Practitioners) who all work together to provide you with the care you need, when you need it.  We recommend signing up for the patient portal called "MyChart".  Sign up information is provided on this After Visit Summary.  MyChart is used to connect with patients for Virtual Visits (Telemedicine).  Patients are able to view lab/test results, encounter notes, upcoming appointments, etc.  Non-urgent messages can be sent to your provider as well.   To learn more about what you can do with MyChart, go to [email or web address].    Your next appointment:  2 months  Wed 8/21 at 10:20 am    Provider: Dr.Maytal Mijangos   Schedule appointment with Pharmacist in Heart Failure Clinic in 2 weeks   Signed, Little Ishikawa, MD  03/04/2023 1:22 PM    [facility]

## 2023-03-04 ENCOUNTER — Encounter: Payer: Self-pay | Admitting: Cardiology

## 2023-03-04 ENCOUNTER — Ambulatory Visit: Payer: PPO | Attending: Cardiology | Admitting: Cardiology

## 2023-03-04 VITALS — BP 144/76 | HR 52 | Ht 72.0 in | Wt 128.4 lb

## 2023-03-04 DIAGNOSIS — I4892 Unspecified atrial flutter: Secondary | ICD-10-CM

## 2023-03-04 DIAGNOSIS — I5022 Chronic systolic (congestive) heart failure: Secondary | ICD-10-CM | POA: Diagnosis not present

## 2023-03-04 DIAGNOSIS — I10 Essential (primary) hypertension: Secondary | ICD-10-CM

## 2023-03-04 DIAGNOSIS — I2510 Atherosclerotic heart disease of native coronary artery without angina pectoris: Secondary | ICD-10-CM | POA: Diagnosis not present

## 2023-03-04 DIAGNOSIS — E785 Hyperlipidemia, unspecified: Secondary | ICD-10-CM

## 2023-03-04 MED ORDER — LOSARTAN POTASSIUM 50 MG PO TABS: 50.0000 mg | ORAL_TABLET | Freq: Every day | ORAL | 3 refills | Status: DC

## 2023-03-04 NOTE — Patient Instructions (Addendum)
Medication Instructions:  Stop Amlodipine Increase Losartan to 50 mg daily Continue all other medications *If you need a refill on your cardiac medications before your next appointment, please call your pharmacy*   Lab Work: Bmet today   Testing/Procedures: None ordered   Follow-Up: At [facility], you and your health needs are our priority.  As part of our continuing mission to provide you with exceptional heart care, we have created designated Provider Care Teams.  These Care Teams include your primary Cardiologist (physician) and Advanced Practice Providers (APPs -  Physician Assistants and Nurse Practitioners) who all work together to provide you with the care you need, when you need it.  We recommend signing up for the patient portal called "MyChart".  Sign up information is provided on this After Visit Summary.  MyChart is used to connect with patients for Virtual Visits (Telemedicine).  Patients are able to view lab/test results, encounter notes, upcoming appointments, etc.  Non-urgent messages can be sent to your provider as well.   To learn more about what you can do with MyChart, go to [email or web address].    Your next appointment:  2 months  Wed 8/21 at 10:20 am    Provider: Dr.Schumann   Schedule appointment with Pharmacist in Heart Failure Clinic in 2 weeks

## 2023-03-05 ENCOUNTER — Other Ambulatory Visit: Payer: Self-pay

## 2023-03-05 DIAGNOSIS — I10 Essential (primary) hypertension: Secondary | ICD-10-CM

## 2023-03-05 DIAGNOSIS — I4892 Unspecified atrial flutter: Secondary | ICD-10-CM

## 2023-03-05 LAB — BASIC METABOLIC PANEL
BUN/Creatinine Ratio: 12 (ref 10–24)
BUN: 12 mg/dL (ref 8–27)
CO2: 23 mmol/L (ref 20–29)
Calcium: 9.7 mg/dL (ref 8.6–10.2)
Chloride: 102 mmol/L (ref 96–106)
Creatinine, Ser: 1.04 mg/dL (ref 0.76–1.27)
Glucose: 98 mg/dL (ref 70–99)
Potassium: 4.8 mmol/L (ref 3.5–5.2)
Sodium: 142 mmol/L (ref 134–144)
eGFR: 79 mL/min/{1.73_m2} (ref >59)

## 2023-03-13 ENCOUNTER — Emergency Department (HOSPITAL_COMMUNITY)
Admission: EM | Admit: 2023-03-13 | Discharge: 2023-03-13 | Disposition: A | Discharge status: Home / Self Care | Payer: PPO | Attending: Emergency Medicine | Admitting: Emergency Medicine

## 2023-03-13 ENCOUNTER — Encounter: Payer: Self-pay | Admitting: Cardiology

## 2023-03-13 ENCOUNTER — Emergency Department (HOSPITAL_COMMUNITY): Payer: PPO

## 2023-03-13 ENCOUNTER — Other Ambulatory Visit: Payer: Self-pay

## 2023-03-13 ENCOUNTER — Telehealth: Payer: Self-pay

## 2023-03-13 DIAGNOSIS — I951 Orthostatic hypotension: Secondary | ICD-10-CM

## 2023-03-13 DIAGNOSIS — R55 Syncope and collapse: Secondary | ICD-10-CM | POA: Insufficient documentation

## 2023-03-13 DIAGNOSIS — R002 Palpitations: Secondary | ICD-10-CM | POA: Insufficient documentation

## 2023-03-13 DIAGNOSIS — Z7901 Long term (current) use of anticoagulants: Secondary | ICD-10-CM | POA: Insufficient documentation

## 2023-03-13 DIAGNOSIS — Z79899 Other long term (current) drug therapy: Secondary | ICD-10-CM | POA: Insufficient documentation

## 2023-03-13 LAB — CBC WITH DIFFERENTIAL/PLATELET
Abs Immature Granulocytes: 0.01 10*3/uL (ref 0.00–0.07)
Basophils Absolute: 0.1 10*3/uL (ref 0.0–0.1)
Basophils Relative: 1 %
Eosinophils Absolute: 0.1 10*3/uL (ref 0.0–0.5)
Eosinophils Relative: 2 %
HCT: 41.1 % (ref 39.0–52.0)
Hemoglobin: 13.1 g/dL (ref 13.0–17.0)
Immature Granulocytes: 0 %
Lymphocytes Relative: 23 %
Lymphs Abs: 1.5 10*3/uL (ref 0.7–4.0)
MCH: 28.1 pg (ref 26.0–34.0)
MCHC: 31.9 g/dL (ref 30.0–36.0)
MCV: 88.2 fL (ref 80.0–100.0)
Monocytes Absolute: 0.7 10*3/uL (ref 0.1–1.0)
Monocytes Relative: 12 %
Neutro Abs: 3.9 10*3/uL (ref 1.7–7.7)
Neutrophils Relative %: 62 %
Platelets: 224 10*3/uL (ref 150–400)
RBC: 4.66 MIL/uL (ref 4.22–5.81)
RDW: 14.7 % (ref 11.5–15.5)
WBC: 6.3 10*3/uL (ref 4.0–10.5)
nRBC: 0 % (ref 0.0–0.2)

## 2023-03-13 LAB — COMPREHENSIVE METABOLIC PANEL
ALT: 19 U/L (ref 0–44)
AST: 32 U/L (ref 15–41)
Albumin: 3.5 g/dL (ref 3.5–5.0)
Alkaline Phosphatase: 45 U/L (ref 38–126)
Anion gap: 8 (ref 5–15)
BUN: 9 mg/dL (ref 8–23)
CO2: 25 mmol/L (ref 22–32)
Calcium: 9.2 mg/dL (ref 8.9–10.3)
Chloride: 107 mmol/L (ref 98–111)
Creatinine, Ser: 1.1 mg/dL (ref 0.61–1.24)
GFR, Estimated: >60 mL/min (ref >60)
Glucose, Bld: 88 mg/dL (ref 70–99)
Potassium: 4.4 mmol/L (ref 3.5–5.1)
Sodium: 140 mmol/L (ref 135–145)
Total Bilirubin: 0.4 mg/dL (ref 0.3–1.2)
Total Protein: 6.5 g/dL (ref 6.5–8.1)

## 2023-03-13 LAB — MAGNESIUM: Magnesium: 2.1 mg/dL (ref 1.7–2.4)

## 2023-03-13 LAB — CBG MONITORING, ED: Glucose-Capillary: 86 mg/dL (ref 70–99)

## 2023-03-13 MED ORDER — SODIUM CHLORIDE 0.9 % IV SOLN: INTRAVENOUS | Status: DC

## 2023-03-13 NOTE — Progress Notes (Signed)
Patient presented for blood work today.  Prior to labs being drawn he was noted to be visibly shaking in waiting room.  Reports he had syncopal episode yesterday while at work.  States that he passed out suddenly.  EMS was called and he was advised to go to ED but declined.  States that this morning he feels like he is going to pass out again.  Vitals show pulse 103, SBP 156/71, SpO2 100%, glucose 104.  EKG unremarkable.  Recommended he go to the ED, he is agreeable.  He drove himself here, would not recommend driving at this time.  Will call EMS.  Little Ishikawa, MD

## 2023-03-13 NOTE — Discharge Instructions (Signed)
Please be sure to follow-up with your physician.  Return here for concerning changes in your condition. 

## 2023-03-13 NOTE — ED Triage Notes (Signed)
PT BIB [facility] for synocpe.  PT had syncope yesterday, eval. By EMS, not transported.  Had near syncope today in elevator while going to PCP for bloodwork.  Pt has recent dx of afib and EMS endorses irregular rhythm today.  Pt is on Eliquis.  VSS.

## 2023-03-13 NOTE — Consult Note (Signed)
Cardiology Consultation   Patient ID: Curtis Henry MRN: [id number]; DOB: 07-Aug-1956  Admit date: 03/13/2023 Date of Consult: 03/13/2023  PCP:  Erick Alley, DO   [facility] Providers Cardiologist:  Little Ishikawa, MD   {   Patient Profile:   Curtis Henry is a 67 y.o. male with a hx of chronic HFrEF, atrial fibrillation/flutter, hypertension, hyperlipidemia, squamous cell lung cancer who is being seen 03/13/2023 for the evaluation of syncope at the request of Dr. Jeraldine Loots.  History of Present Illness:   Curtis Henry is currently followed by Dr. Gaynelle Arabian and last seen in office on 03/04/2023.  Initially was referred in April 2024 with complaints of chest pain, palpitations.  In February 2024 he had a Zio patch that showed 1 episode of NSVT lasting 5 beats, and the longest lasting 1 minute 37 seconds, atrial flutter with 2% burden, frequent PACs 7.7%.  Currently managed with Eliquis and Toprol-XL 75 mg daily.  Follow-up echocardiogram in April 2024 showed LVEF of 40 to 45% with global hypokinesis and mildly reduced RV function.  Patient also underwent right and left heart catheterization on 01/17/2023 that showed mild nonobstructive CAD (20% ostial left main stenosis, 30% ramus) with low to normal right heart pressures (RA 1, RV 22/1, PA 23/6/16, PCWP 5, CI 2.2).  Due to negative findings, plans were for cardiac MRI to evaluate for nonischemic cardiomyopathy. Overall reported to be doing well with improvement of palpitations and shortness of breath.  Now patient presents to the emergency room on 03/13/2023 due to reported episode of syncope yesterday and seen visibly shaking at today's office visit while getting blood drawn.  Yesterday, patient states that he was sitting in his truck and abruptly stood up and started to feel dizzy with some visual disturbances including blurry vision and some light fluttering.  Although unwitnessed, he did say he passed out however quickly popped  back up and went into his truck and turn on the fan.  States that event lasted 30 seconds to 1 minute.  Patient did not have any prodrome or postictal phase.  EMS was called and he was advised to go to the emergency room but declined.  Patient states that he has had similar episodes however has never lost consciousness before.  States that a similar episode occurred 8 months ago and was from a sitting to standing position.  Patient denies any loss of bowel function, denies any urinary continence, chest pain, palpitations, muscle weakness.  Denied decreased oral intake, glucose was normal.  States he drank about a case of water yesterday. He did admit to some burning in his chest during the event.  No significant findings on imaging or labs.  Past Medical History:  Diagnosis Date   Anxiety state 03/08/2018   Bursitis of left shoulder    Essential hypertension 01/31/2018   In 2019, has not been on medication since this time.   GERD (gastroesophageal reflux disease)    Hyperlipidemia    Restless leg syndrome    Squamous cell lung cancer [facility])     Past Surgical History:  Procedure Laterality Date   CIRCUMCISION  12/13/2003   COLONOSCOPY     RIGHT/LEFT HEART CATH AND CORONARY ANGIOGRAPHY N/A 01/17/2023   Procedure: RIGHT/LEFT HEART CATH AND CORONARY ANGIOGRAPHY;  Surgeon: Lennette Bihari, MD;  Location: [facility] INVASIVE CV LAB;  Service: Cardiovascular;  Laterality: N/A;   VIDEO ASSISTED THORACOSCOPY (VATS)/ LOBECTOMY Left 01/17/2018   Procedure: left VIDEO ASSISTED THORACOSCOPY (VATS) with  left lower lobe wedge resection and LOBECTOMY;  Surgeon: Kerin Perna, MD;  Location: [facility] OR;  Service: Thoracic;  Laterality: Left;       Inpatient Medications: Scheduled Meds:  Continuous Infusions:  sodium chloride 125 mL/hr at 03/13/23 1008   PRN Meds:   Allergies:    Allergies  Allergen Reactions   Lactose Intolerance (Gi) Nausea Only and Other (See Comments)    Unsettled bowel.     Social  History:   Social History   Socioeconomic History   Marital status: Widowed    Spouse name: Not on file   Number of children: 5   Years of education: Not on file   Highest education level: Not on file  Occupational History   Occupation: [employer]  Tobacco Use   Smoking status: Former    Packs/day: 0.10    Years: 42.00    Additional pack years: 0.00    Total pack years: 4.20    Types: Cigarettes    Start date: 09/17/1974    Quit date: 2022    Years since quitting: 2.4   Smokeless tobacco: Never  Vaping Use   Vaping Use: Some days   Substances: Nicotine, Flavoring  Substance and Sexual Activity   Alcohol use: Not Currently   Drug use: Yes    Frequency: 7.0 times per week    Types: Marijuana    Comment: smokes marijuana; crack use distant past >30 yrs ago   Sexual activity: Not Currently  Other Topics Concern   Not on file  Social History Narrative   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    Social Determinants of Health   Financial Resource Strain: Medium Risk (12/25/2022)   Overall Financial Resource Strain (CARDIA)    Difficulty of Paying Living Expenses: Somewhat hard  Food Insecurity: Food Insecurity Present (12/25/2022)   Hunger Vital Sign    Worried About Running Out of Food in the Last Year: Sometimes true    Ran Out of Food in the Last Year: Sometimes true  Transportation Needs: No Transportation Needs (12/25/2022)   PRAPARE - Administrator, Civil Service (Medical): No    Lack of Transportation (Non-Medical): No  Physical Activity: Inactive (12/25/2022)   Exercise Vital Sign    Days of Exercise per Week: 0 days    Minutes of Exercise per Session: 0 min  Stress: Stress Concern Present (12/25/2022)   [employer] of Occupational Health - Occupational Stress Questionnaire    Feeling of Stress : To some extent  Social Connections: Socially Isolated (12/25/2022)   Social Connection and Isolation Panel [NHANES]    Frequency of Communication with  Friends and Family: More than three times a week    Frequency of Social Gatherings with Friends and Family: More than three times a week    Attends Religious Services: Never    Database administrator or Organizations: No    Attends Banker Meetings: Never    Marital Status: Widowed  Intimate Partner Violence: Not At Risk (12/25/2022)   Humiliation, Afraid, Rape, and Kick questionnaire    Fear of Current or Ex-Partner: No    Emotionally Abused: No    Physically Abused: No    Sexually Abused: No    Family History:   Family History  Problem Relation Age of Onset   Esophageal cancer Brother    Colon cancer Father 78   Arthritis Other      ROS:  Please see the history of  present illness.  All other ROS reviewed and negative.     Physical Exam/Data:   Vitals:   03/13/23 1315 03/13/23 1330 03/13/23 1345 03/13/23 1401  BP: (!) 148/79 (!) 151/71 (!) 158/85 (!) 155/79  Pulse: (!) 57 (!) 53 (!) 53 72  Resp: 15 11 18    Temp:    97.9 F (36.6 C)  TempSrc:    Oral  SpO2: 100% 100% 100% 99%   No intake or output data in the 24 hours ending 03/13/23 1506    03/04/2023   10:09 AM 02/15/2023    9:37 AM 01/17/2023    7:21 AM  Last 3 Weights  Weight (lbs) 128 lb 6.4 oz 128 lb 12.8 oz 134 lb  Weight (kg) 58.242 kg 58.423 kg 60.782 kg     There is no height or weight on file to calculate BMI.  General:  Well nourished, well developed, in no acute distress HEENT: normal Neck: no JVD Vascular: No carotid bruits; Distal pulses 2+ bilaterally Cardiac:  normal S1, S2; RRR; no murmur  Lungs:  clear to auscultation bilaterally, no wheezing, rhonchi or rales  Abd: soft, nontender, no hepatomegaly  Ext: no edema Musculoskeletal:  No deformities, BUE and BLE strength normal and equal Skin: warm and dry  Neuro:  CNs 2-12 intact, no focal abnormalities noted Psych:  Normal affect   EKG:  The EKG was personally reviewed and demonstrates:  EKG shows sinus bradycardia, heart rate 59.   Some variation in lateral T waves.  Unchanged from prior reading. Telemetry:  Telemetry was personally reviewed and demonstrates: Has variable rhythms noted on telemetry.  Fluctuates between atrial fibrillation/flutter with heart rates that jump up to 120.  At times when he is maintaining normal sinus rhythm are in the 50s and 60s.  He has frequent PACs.   Relevant CV Studies: Right left heart catheterization  Ost LM lesion is 20% stenosed.   Ramus lesion is 30% stenosed.   Very mild nonobstructive CAD with smooth ostial 20% narrowing of the left main, and 30% very proximal narrowing in a small ramus intermediate vessel.  Normal large LAD, dominant RCA, and small left circumflex vessel.   Low normal right heart pressures.  Mean PA pressure 16 mmHg.   PVR: 2.8 WU   RECOMMENDATION: Findings are compatible with nonischemic cardiomyopathy.  During the procedure the patient was going in and out of paroxysmal SVT.  Recommend resumption of Eliquis tomorrow.  Medical therapy.  Echocardiogram 12/21/2022 1. Left ventricular ejection fraction, by estimation, is 40 to 45%, with  significant variability beat to beat. The left ventricle has mildly  decreased function. The left ventricle demonstrates global hypokinesis.  There is mild left ventricular  hypertrophy. Left ventricular diastolic parameters are indeterminate.   2. Right ventricular systolic function is mildly reduced. The right  ventricular size is normal. Tricuspid regurgitation signal is inadequate  for assessing PA pressure.   3. Borderline bileaflet mitral valve prolapse.. The mitral valve is  grossly normal. Trivial mitral valve regurgitation.   4. The aortic valve is tricuspid. Aortic valve regurgitation is not  visualized.   5. The inferior vena cava is normal in size with greater than 50%  respiratory variability, suggesting right atrial pressure of 3 mmHg.   Cardiac monitor 13 days 11/27/2022 Sinus rhythm. (min HR of 44 bpm and  avg HR of 78 bpm).  1 run of Ventricular Tachycardia occurred lasting 5 beats.  5221 Supraventricular Tachycardia runs occurred, the run with the fastest interval  lasting 5.8 secs with a max rate of 250 bpm, the longest lasting 1 min 37 secs with an avg rate of 173 bpm.  Atrial Flutter occurred (2% burden), ranging from 68-207 bpm (avg of 140 bpm), the longest  lasting 35 mins 52 secs with an avg rate of 149 bpm.  Supraventricular Tachycardia was detected within +/- 45 seconds of symptomatic patient event(s).  Frequent premature atrial contractions (7.7%, [id number]), SVE Couplets were rare (<1.0%, 4894), and SVE Triplets were  occasional (1.5%, 7596).  Rare premature ventricular contractions (<1.0%, 5855), VE Triplets were rare (<1.0%, 1), and no VE Couplets were present. Ventricular Trigeminy was present.    Recommend EP referral   Laboratory Data:  High Sensitivity Troponin:  No results for input(s): "TROPONINIHS" in the last 720 hours.   Chemistry Recent Labs  Lab 03/13/23 0949  NA 140  K 4.4  CL 107  CO2 25  GLUCOSE 88  BUN 9  CREATININE 1.10  CALCIUM 9.2  MG 2.1  GFRNONAA >60  ANIONGAP 8    Recent Labs  Lab 03/13/23 0949  PROT 6.5  ALBUMIN 3.5  AST 32  ALT 19  ALKPHOS 45  BILITOT 0.4   Lipids No results for input(s): "CHOL", "TRIG", "HDL", "LABVLDL", "LDLCALC", "CHOLHDL" in the last 168 hours.  Hematology Recent Labs  Lab 03/13/23 0949  WBC 6.3  RBC 4.66  HGB 13.1  HCT 41.1  MCV 88.2  MCH 28.1  MCHC 31.9  RDW 14.7  PLT 224    Cardiac monitor 13 days Thyroid No results for input(s): "TSH", "FREET4" in the last 168 hours.  BNPNo results for input(s): "BNP", "PROBNP" in the last 168 hours.  DDimer No results for input(s): "DDIMER" in the last 168 hours.   Radiology/Studies:  DG Chest Port 1 View  Result Date: 03/13/2023 CLINICAL DATA:  Question LH.  No other history provided. EXAM: PORTABLE CHEST 1 VIEW COMPARISON:  09/02/2022 FINDINGS: emphysematous  change of the lungs. No evidence of pneumothorax, infiltrate, collapse or effusion. Surgical clips project over the central chest. IMPRESSION: Emphysema. No acute or focal finding. Electronically Signed   By: Paulina Fusi M.D.   On: 03/13/2023 10:04     Assessment and Plan:   Reported episode of unwitnessed syncope vs orthostatic hypotension  Given HPI I think there is a high suspicion of orthostatic hypotension. Patient states that he felt dizzy and had passed out yesterday after abruptly sitting in his truck to standing and states that entire event lasted 30 seconds to 1 minute.  Borderline + Orthostatic vital signs listed below.  Given hot weather and positive orthostatics, no evidence of prodromal or postictal phase do not think that there is any reason for further workup. Patient has already had extensive cardiac workup without significant findings that would contribute to patients current complaints. It is possible that his varying heart rates along with orthostatic hypotension can manifest into a possible episode of syncope.  He has had other vague complaints of shaking that its not totally clearly of that etiology.  No significant findings on lab testing. No obvious culprit of medication induced syncope.  Will stop imdur.  CV results: 13 day Holter monitor 11/2022 that showed 1 episode of NSVT lasting 5 beats, frequent PACs 7.7%, 2% atrial flutter burden.  Recent cardiac catheterization that showed nonobstructive disease and low to normal right heart pressures.  Has reduced LVEF of 40 to 45% that is thought to be nonischemic given results as above.  Plan for cardiac  MRI.   Most Recent 03/15/18 - 03/13/23 03/13/23 14:35  BP- Lying 153/78 03/13/23 14:35 153/78  Pulse- Lying 52 03/13/23 14:35 52  BP- Sitting 147/91 (H) 03/13/23 14:35 147/91 (H)  Pulse- Sitting 53 03/13/23 14:35 53  BP- Standing at 0 minutes 130/84 03/13/23 14:35 130/84  Pulse- Standing at 0 minutes 60 03/13/23 14:35 60  BP-  Standing at 3 minutes 145/90 03/13/23 14:35 145/90  Pulse- Standing at 3 minutes 60 03/13/23 14:35 60   Chronic HFrEF Echo on 12/21/2022 shows LVEF of 40 to 45%.  Thought to be nonischemic due to nonobstructive CAD noted on catheterization on 01/2023.  Euvolemic on exam today Continue Toprol-XL 75 mg daily, hydralazine 25 mg twice daily, losartan 50 mg daily. Has plans to add SGLT2 inhibitor spironolactone, can address outpatient Plans for cardiac MRI  Nonobstructive CAD Nonobstructive CAD (20% ostial left main stenosis, 30% ramus).  No anginal complaints Continue 80 mg atorvastatin, Eliquis  Paroxysmal atrial flutter/fibrillation Telemetry shows intermittent bouts of atrial flutter/fibrillation with heart rates jumping to the 120s.  However, baseline heart rhythm is normal sinus rhythm with heart rates in the 60s 70s. Continue Eliquis and metoprolol XL as above     Risk Assessment/Risk Scores:  New York Heart Association (NYHA) Functional Class NYHA Class II  CHA2DS2-VASc Score = 3  This indicates a 3.2% annual risk of stroke. The patient's score is based upon: CHF History: 1 HTN History: 1 Diabetes History: 0 Stroke History: 0 Vascular Disease History: 0 Age Score: 1 Gender Score: 0     For questions or updates, please contact [facility] Please consult [email or web address] for contact info under    Signed, Abagail Kitchens, PA-C  03/13/2023 3:06 PM

## 2023-03-13 NOTE — Telephone Encounter (Signed)
Patient came to office for Lab draw. Front desk called and stated patient feeling dizzy and shaking.  Assessment of patient finds, Alert and oriented X4.  Shaky, chest tightness. BP 156/71  HR 103 O2 100%  R 24-26.  Unable to obtain BG at this time.  Patient given Soft drink and graham crackers. He has not eaten today, just water.  Patient answers questions stating he had a similar incident yesterday and seen by EMS.  He states BG was in the 80's as best as he remembers but no other information regarding V/S.  While in waiting room, patient shakes lessen and he is able to get in Wheelchair to move to patient room.  Retake V/A BP 154/74, HR  62,  O2 100%,  R 20.  Patient states yesterday at work had this episode and passed out completely.  He states EMS came to evaluate and asked to take him to ED. He declined.  They asked him to take himself to ED. Instead he drove himself home. DOD assessed patient . Patient shaking has resolved , though a little unsteady on his feet.  He is stable, however refusing to go  to ED.  Doctor did state need and patient wants to drive himself.  Doctor advised that since he had been unsteady, he should go by EMS.  Also patient would most likely not go if not via EMS.  EMS called and patient transported to [facility]

## 2023-03-13 NOTE — ED Provider Notes (Signed)
[place] EMERGENCY DEPARTMENT AT [facility] Provider Note   CSN: [id number] Arrival date & time: 03/13/23  9528     History  No chief complaint on file.   Curtis Henry is a 67 y.o. male.  HPI Patient presents on transfer from the cardiology clinic.  Patient currently states that he feels "great".  However, patient had an episode of syncope yesterday, no prodrome, no postictal phase.  Today, she went to the cardiology clinic, and while there was shaking, lightheaded, felt palpitations, did not have syncope, but with concern for unstable studies to center for evaluation. Patient notes recent change in his blood pressure medication regimen.  He has a history of atrial arrhythmia, is taking his Eliquis as directed.     Home Medications Prior to Admission medications   Medication Sig Start Date End Date Taking? Authorizing Provider  acetaminophen (TYLENOL) 500 MG tablet Take 1,000 mg by mouth every 6 (six) hours as needed for moderate pain.    [provider]  apixaban (ELIQUIS) 5 MG TABS tablet Take 1 tablet (5 mg total) by mouth 2 (two) times daily. 11/28/22   Erick Alley, DO  atorvastatin (LIPITOR) 80 MG tablet Take 1 tablet (80 mg total) by mouth daily. 02/19/23   Little Ishikawa, MD  betamethasone valerate ointment (VALISONE) 0.1 % Apply 1 application topically 2 (two) times daily. Patient taking differently: Apply 1 application  topically daily. 10/10/21   Alicia Amel, MD  gabapentin (NEURONTIN) 600 MG tablet TAKE 1 TABLET BY MOUTH AT BEDTIME 01/01/23   Erick Alley, DO  hydrALAZINE (APRESOLINE) 25 MG tablet Take 1 tablet (25 mg total) by mouth 3 (three) times daily. 01/02/23 12/28/23  Little Ishikawa, MD  isosorbide mononitrate (IMDUR) 30 MG 24 hr tablet Take 1 tablet (30 mg total) by mouth daily. 01/02/23 12/28/23  Little Ishikawa, MD  losartan (COZAAR) 50 MG tablet Take 1 tablet (50 mg total) by mouth daily. 03/04/23 06/02/23  Little Ishikawa, MD  metoprolol succinate (TOPROL-XL) 50 MG 24 hr tablet Take 1.5 tablets (75 mg total) by mouth at bedtime. Take with or immediately following a meal. 01/14/23   Little Ishikawa, MD  Multiple Vitamin (MULTIVITAMIN) tablet Take 1 tablet by mouth daily.    [provider]  Omega-3 Fatty Acids (FISH OIL) 1200 MG CAPS Take 1,200 mg by mouth daily.    [provider]      Allergies    Lactose intolerance (gi)    Review of Systems   Review of Systems  All other systems reviewed and are negative.   Physical Exam Updated Vital Signs BP (!) 166/83   Pulse (!) 58   Temp 97.9 F (36.6 C) (Oral)   Resp 13   SpO2 100%  Physical Exam Vitals and nursing note reviewed.  Constitutional:      General: He is not in acute distress.    Appearance: He is well-developed.  HENT:     Head: Normocephalic and atraumatic.  Eyes:     Conjunctiva/sclera: Conjunctivae normal.  Cardiovascular:     Rate and Rhythm: Normal rate and regular rhythm.  Pulmonary:     Effort: Pulmonary effort is normal. No respiratory distress.     Breath sounds: No stridor.  Abdominal:     General: There is no distension.  Skin:    General: Skin is warm and dry.  Neurological:     Mental Status: He is alert and oriented to person, place,  and time.     ED Results / Procedures / Treatments   Labs (all labs ordered are listed, but only abnormal results are displayed) Labs Reviewed  COMPREHENSIVE METABOLIC PANEL  CBC WITH DIFFERENTIAL/PLATELET  MAGNESIUM  CBG MONITORING, ED    EKG EKG Interpretation  Date/Time:  Wednesday March 13 2023 09:56:45 EDT Ventricular Rate:  59 PR Interval:  138 QRS Duration: 91 QT Interval:  410 QTC Calculation: 407 R Axis:   65 Text Interpretation: Sinus rhythm Probable left atrial enlargement T wave abnormality Artifact Confirmed by Gerhard Munch [id number]) on 03/13/2023 10:54:56 AM  Radiology DG Chest Port 1 View  Result Date:  03/13/2023 CLINICAL DATA:  Question LH.  No other history provided. EXAM: PORTABLE CHEST 1 VIEW COMPARISON:  09/02/2022 FINDINGS: emphysematous change of the lungs. No evidence of pneumothorax, infiltrate, collapse or effusion. Surgical clips project over the central chest. IMPRESSION: Emphysema. No acute or focal finding. Electronically Signed   By: Paulina Fusi M.D.   On: 03/13/2023 10:04    Procedures Procedures    Medications Ordered in ED Medications  0.9 %  sodium chloride infusion ( Intravenous New Bag/Given 03/13/23 1008)    ED Course/ Medical Decision Making/ A&P                             Medical Decision Making Adult male with history of hypercholesterolemia, hypertension, COPD, A-fib presents with recurrent syncope/near syncope. Concern for ongoing unstable rhythm, less likely ACS, pneumonia, other infection. Labs x-ray ordered. Cardiac 90 sinus normal Pulse ox 100% room air normal EMS rhythm strip with sinus rhythm, PAC, rate 66  Amount and/or Complexity of Data Reviewed Independent Historian: EMS    Details: As above, no hemodynamic instability in transport. External Data Reviewed: notes. Labs: ordered. Decision-making details documented in ED Course. Radiology: ordered and independent interpretation performed. Decision-making details documented in ED Course. ECG/medicine tests: ordered and independent interpretation performed. Decision-making details documented in ED Course.  Risk Prescription drug management.   4:10 PM Patient seen and evaluated by cardiology, in no distress, awake, alert, aware of all findings thus far, he has had no episodes, and I discussed patient's case with our cardiology colleagues. No evidence for bacteremia, sepsis, MI, sustained arrhythmia, heart failure. Patient will change medication, will follow-up in the cardiology clinic.        Final Clinical Impression(s) / ED Diagnoses Final diagnoses:  Syncope and collapse    Rx /  DC Orders ED Discharge Orders     None         Gerhard Munch, MD 03/13/23 1610

## 2023-03-15 ENCOUNTER — Other Ambulatory Visit (INDEPENDENT_AMBULATORY_CARE_PROVIDER_SITE_OTHER): Payer: PPO | Admitting: *Deleted

## 2023-03-15 ENCOUNTER — Other Ambulatory Visit: Payer: Self-pay | Admitting: *Deleted

## 2023-03-15 DIAGNOSIS — I4892 Unspecified atrial flutter: Secondary | ICD-10-CM

## 2023-03-15 DIAGNOSIS — R42 Dizziness and giddiness: Secondary | ICD-10-CM

## 2023-03-15 NOTE — Progress Notes (Signed)
EKG ordered by Dr. Bjorn Pippin and performed in clinic on 03/13/23.

## 2023-03-26 ENCOUNTER — Other Ambulatory Visit: Payer: Self-pay | Admitting: Thoracic Surgery (Cardiothoracic Vascular Surgery)

## 2023-03-26 DIAGNOSIS — Z85118 Personal history of other malignant neoplasm of bronchus and lung: Secondary | ICD-10-CM

## 2023-04-01 NOTE — Progress Notes (Unsigned)
Office Visit    Patient Name: Curtis Henry Date of Encounter: 04/02/2023  Primary Care Provider:  Erick Alley, DO Primary Cardiologist:  Little Ishikawa, MD  Chief Complaint    Heart Failure Medication Titration - EF 40-45%% (by echo 12/2022)  Significant Past Medical History   CAD Mild non-obstructive  HTN Controlled with HF medication regimen  Lung cancer Squamous cell - 2019  preDM 1/23 A1c 6    Allergies  Allergen Reactions   Lactose Intolerance (Gi) Nausea Only and Other (See Comments)    Unsettled bowel.     History of Present Illness    Curtis Henry is a 67 y.o. male patient of Dr Bjorn Pippin, in the office today for heart failure medication titration.   Currently on losartan and metoprolol.   He had an episode of AKI earlier this year, SCr increased from 1.07 to 1.85.  Olmesartan hctz was discontinued and a re-check showed a return to baseline.     To prevent issues with medication costs, signed patient up with [facility] for cardiomyopathy.  (ID [id number], BIN  [id number], PCN  [id number],   GRP [id number])  Blood Pressure Goal:  130/80  GDMT: ACEI/ARB/ARNI [x] Yes [] No Losartan 50 mg every day (increased 03/04/23)  Beta blocker [x] Yes [] No Metoprolol succ 75  MRA [] Yes [x] No   SGLT2 inhibitor [] Yes [x] No     Family Hx:   mother died in 83 - pregnancy complication;  father died around 9; 6 siblings, no heart disease; 5 children - 2 daughters with high cholesterol  Social Hx:      Tobacco: vape, marijuana  Alcohol:  rare occasions  Caffeine: no coffee, occasional tea, 1 can of Coke few times per week  Diet:    mix of home and eating out, does use some salt with cooking, occasionally at table; no regular vegetables, does drink V8 juice daily; snacks regularly - chips, cookies, candy  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 [] Yes [x] No  Do you ever skip doses due to side effects? [] Yes [x] No  Do you have trouble affording your medicines? [x] Yes - Eliquis [] No  Are you ever unable to pick up your medication due to transportation difficulties? [] Yes [x] No    Accessory Clinical Findings    Lab Results  Component Value Date   CREATININE 1.10 03/13/2023   BUN 9 03/13/2023   NA 140 03/13/2023   K 4.4 03/13/2023   CL 107 03/13/2023   CO2 25 03/13/2023   Lab Results  Component Value Date   ALT 19 03/13/2023   AST 32 03/13/2023   ALKPHOS 45 03/13/2023   BILITOT 0.4 03/13/2023   Lab Results  Component Value Date   HGBA1C 6.0 (A) 10/10/2021    Home Medications/Allergies    Current Outpatient Medications  Medication Sig Dispense Refill   apixaban (ELIQUIS) 5 MG TABS tablet Take 1 tablet (5 mg total) by mouth 2 (two) times daily. 180 tablet 3   atorvastatin (LIPITOR) 80 MG tablet Take 1 tablet (80 mg total) by mouth daily. 90 tablet 1   betamethasone valerate ointment (VALISONE) 0.1 % Apply 1 application topically 2 (two) times daily. (Patient taking differently: Apply 1 application  topically daily.) 30 g 0   gabapentin (NEURONTIN) 600 MG tablet TAKE 1 TABLET BY MOUTH AT BEDTIME 90 tablet  0   hydrALAZINE (APRESOLINE) 25 MG tablet Take 1 tablet (25 mg total) by mouth 3 (three) times daily. 270 tablet 3   losartan (COZAAR) 50 MG tablet Take 1 tablet (50 mg total) by mouth daily. 90 tablet 3   metoprolol succinate (TOPROL-XL) 50 MG 24 hr tablet Take 1.5 tablets (75 mg total) by mouth at bedtime. Take with or immediately following a meal. 135 tablet 3   Multiple Vitamin (MULTIVITAMIN) tablet Take 1 tablet by mouth daily.     Omega-3 Fatty Acids (FISH OIL) 1200 MG CAPS Take 1,200 mg by mouth daily.     No current facility-administered medications for this visit.     Allergies  Allergen Reactions   Lactose Intolerance (Gi) Nausea Only and Other (See Comments)     Unsettled bowel.        Assessment & Plan      Chronic systolic heart failure (HCC) Assessment: BP in office today is 128/64 Patient with HFmrEF   Tolerates current medications without any side effects Denies SOB, palpitation, chest pain, headaches,or edema Eliquis is cost prohibitive, Entresto not started yet, perhaps due to added cost burden. Reiterated the importance of regular exercise and low salt diet  Plan: GDMT ACEI/ARB/ARNI Losartan 50 mg Consider switch to Entresto -[employer] grant  Beta blocker Metoprolol succ 75 mg   MRA  Consider adding next if renal function stable  SGLT2 Dapagliflozin 10 [employer] grant approved   Labs orderd:  BMET in 10 days - watch closely for signs of AKI Follow up with PharmD in 3 weeks   Phillips Hay PharmD CPP [facility]  [address] [place], [place] [address] [phone]

## 2023-04-02 ENCOUNTER — Ambulatory Visit: Payer: PPO | Attending: Cardiology | Admitting: Pharmacist Clinician (PhC)/ Clinical Pharmacy Specialist

## 2023-04-02 ENCOUNTER — Encounter: Payer: Self-pay | Admitting: Pharmacist Clinician (PhC)/ Clinical Pharmacy Specialist

## 2023-04-02 VITALS — BP 128/64 | HR 54

## 2023-04-02 DIAGNOSIS — I5022 Chronic systolic (congestive) heart failure: Secondary | ICD-10-CM

## 2023-04-02 NOTE — Assessment & Plan Note (Addendum)
Assessment: BP in office today is 128/64 Patient with HFmrEF   Tolerates current medications without any side effects Denies SOB, palpitation, chest pain, headaches,or edema Eliquis is cost prohibitive, Entresto not started yet, perhaps due to added cost burden. Reiterated the importance of regular exercise and low salt diet  Plan: GDMT ACEI/ARB/ARNI Losartan 50 mg Consider switch to Entresto -[employer] grant  Beta blocker Metoprolol succ 75 mg   MRA  Consider adding next if renal function stable  SGLT2 Dapagliflozin 10 [employer] grant approved   Labs orderd:  BMET in 10 days - watch closely for signs of AKI Follow up with PharmD in 3 weeks Registered patient with [facility] for cardiomyopathy.

## 2023-04-02 NOTE — Patient Instructions (Addendum)
Return for a follow-up appointment on August 8 at 8:30 am  Your blood pressure today is 128/64 (your goal is < 130/80)   Your heart rate today is 54  Medication changes made during today's appointment: Start Farxiga 10 mg once daily   Continue with all of your other medications  Go to the lab in 10 days (July 25 or 26) to check kidney function  Please record and bring your home blood pressure and heart rate readings to your next appointment. It is important to notify your doctor if you notice your heart rate is below 50 or if you start to feel symptoms associated with low blood pressure such as dizziness, fatigue, or light-headedness. We will not increase the dose further if you start to experience these symptoms.   How to take blood pressure at home:  Rest for 5 minutes before measuring   Sit up straight with both feet on the floor (don't cross your legs). Use the appropriate size cuff; it should fit smoothly and snugly around your bare upper arm. Make sure the bottom edge of the cuff is about an inch above the crease of your elbow.  Make sure the arm that the cuff is on is relaxed and elevated to about heart level. This can usually be accomplished by resting your arm on a table or arm rest of a chair.  Remember: do not smoke, drink caffeine, eat a meal, or exercise for at least 30 minutes before taking your blood pressure.  Other steps to take specific to your condition:  Limit salt intake: try salt substitutes such as Mrs. Dash, onion powder, or black pepper.  Daily weights: weigh yourself each morning before eating/drinking but after urinating. Physical activity: try to increase physical activity. Even walking 30 minutes daily helps.   Call your doctor if these symptoms occur:  If you notice weight gain exceeding 3 pounds in a day or 5 pounds in a week  If you notice swelling around your legs, calves, ankles, and/or feet  If you become short of breath or find that it is more difficult  than usual to move around  If you have to use pillows or sit up at night due to not being able to breathe while laying down    [facility]  - will pay for Farxiga, losartan and metoprolol  ID   [id number] BIN    [id number] PCN   PXXPDMI GRP [id number]

## 2023-04-18 ENCOUNTER — Other Ambulatory Visit: Payer: Self-pay | Admitting: Student

## 2023-04-18 DIAGNOSIS — G2581 Restless legs syndrome: Secondary | ICD-10-CM

## 2023-04-25 ENCOUNTER — Ambulatory Visit: Payer: PPO

## 2023-04-25 NOTE — Progress Notes (Deleted)
Office Visit    Patient Name: Curtis Henry Date of Encounter: 04/25/2023  Primary Care Provider:  Erick Alley, DO Primary Cardiologist:  Little Ishikawa, MD  Chief Complaint    Heart Failure Medication Titration - EF 40-45%% (by echo 12/2022)  Significant Past Medical History   CAD Mild non-obstructive  HTN Controlled with HF medication regimen  Lung cancer Squamous cell - 2019  preDM 1/23 A1c 6    Allergies  Allergen Reactions   Lactose Intolerance (Gi) Nausea Only and Other (See Comments)    Unsettled bowel.     History of Present Illness    Curtis Henry is a 67 y.o. male patient of Dr Bjorn Pippin, in the office today for heart failure medication titration.   Currently on losartan and metoprolol.   He had an episode of AKI earlier this year, SCr increased from 1.07 to 1.85.  Olmesartan hctz was discontinued and a re-check showed a return to baseline.     To prevent issues with medication costs, signed patient up with [facility] for cardiomyopathy.  (ID [id number], BIN  [id number], PCN  [id number],   GRP [id number])  Blood Pressure Goal:  130/80  GDMT: ACEI/ARB/ARNI [x] Yes [] No Losartan 50 mg every day (increased 03/04/23)  Beta blocker [x] Yes [] No Metoprolol succ 75  MRA [] Yes [x] No   SGLT2 inhibitor [] Yes [x] No     Family Hx:   mother died in 15 - pregnancy complication;  father died around 72; 6 siblings, no heart disease; 5 children - 2 daughters with high cholesterol  Social Hx:      Tobacco: vape, marijuana  Alcohol:  rare occasions  Caffeine: no coffee, occasional tea, 1 can of Coke few times per week  Diet:    mix of home and eating out, does use some salt with cooking, occasionally at table; no regular vegetables, does drink V8 juice daily; snacks regularly - chips, cookies, candy  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 [] Yes [x] No  Do you ever skip doses due to side effects? [] Yes [x] No  Do you have trouble affording your medicines? [x] Yes - Eliquis [] No  Are you ever unable to pick up your medication due to transportation difficulties? [] Yes [x] No    Accessory Clinical Findings    Lab Results  Component Value Date   CREATININE 1.10 03/13/2023   BUN 9 03/13/2023   NA 140 03/13/2023   K 4.4 03/13/2023   CL 107 03/13/2023   CO2 25 03/13/2023   Lab Results  Component Value Date   ALT 19 03/13/2023   AST 32 03/13/2023   ALKPHOS 45 03/13/2023   BILITOT 0.4 03/13/2023   Lab Results  Component Value Date   HGBA1C 6.0 (A) 10/10/2021    Home Medications/Allergies    Current Outpatient Medications  Medication Sig Dispense Refill   apixaban (ELIQUIS) 5 MG TABS tablet Take 1 tablet (5 mg total) by mouth 2 (two) times daily. 180 tablet 3   atorvastatin (LIPITOR) 80 MG tablet Take 1 tablet (80 mg total) by mouth daily. 90 tablet 1   betamethasone valerate ointment (VALISONE) 0.1 % Apply 1 application topically 2 (two) times daily. (Patient taking differently: Apply 1 application  topically daily.) 30 g 0   gabapentin (NEURONTIN) 600 MG tablet TAKE 1 TABLET BY MOUTH AT BEDTIME 90 tablet  0   hydrALAZINE (APRESOLINE) 25 MG tablet Take 1 tablet (25 mg total) by mouth 3 (three) times daily. 270 tablet 3   losartan (COZAAR) 50 MG tablet Take 1 tablet (50 mg total) by mouth daily. 90 tablet 3   metoprolol succinate (TOPROL-XL) 50 MG 24 hr tablet Take 1.5 tablets (75 mg total) by mouth at bedtime. Take with or immediately following a meal. 135 tablet 3   Multiple Vitamin (MULTIVITAMIN) tablet Take 1 tablet by mouth daily.     Omega-3 Fatty Acids (FISH OIL) 1200 MG CAPS Take 1,200 mg by mouth daily.     No current facility-administered medications for this visit.     Allergies  Allergen Reactions   Lactose Intolerance (Gi) Nausea Only and Other (See Comments)     Unsettled bowel.        Assessment & Plan   No BP recorded.  {Refresh Note OR Click here to enter BP  :1}***  No problem-specific Assessment & Plan notes found for this encounter.   Phillips Hay PharmD CPP [facility]  [address] [place], [place] [address] [phone]

## 2023-05-01 ENCOUNTER — Encounter: Payer: Self-pay | Admitting: Pharmacist Clinician (PhC)/ Clinical Pharmacy Specialist

## 2023-05-01 ENCOUNTER — Ambulatory Visit: Payer: PPO | Attending: Cardiovascular Disease | Admitting: Pharmacist Clinician (PhC)/ Clinical Pharmacy Specialist

## 2023-05-01 VITALS — BP 131/73 | HR 60

## 2023-05-01 DIAGNOSIS — I5022 Chronic systolic (congestive) heart failure: Secondary | ICD-10-CM

## 2023-05-01 MED ORDER — SACUBITRIL-VALSARTAN 24-26 MG PO TABS: 1.0000 | ORAL_TABLET | Freq: Two times a day (BID) | ORAL | Status: DC

## 2023-05-01 MED ORDER — DAPAGLIFLOZIN PROPANEDIOL 10 MG PO TABS: 10.0000 mg | ORAL_TABLET | Freq: Every day | ORAL | 3 refills | Status: DC

## 2023-05-01 NOTE — Assessment & Plan Note (Signed)
Assessment: BP in office today is 131/73 Patient with  HFmrEF Tolerates current medications without any side effects Denies SOB, palpitation, chest pain, headaches,or edema No concerns with regards to cost of medications, compliance, ability to pick up at pharmacy (has [employer]) Reiterated the importance of low salt diet  Plan: GDMT ACEI/ARB/ARNI Entresto 24/26 Start today, needs BMET 1-2 weeks, [employer] grant to cover  Beta blocker Metoprolol succ 75 mg   MRA  Consider if renal function stable with Entresto  SGLT2 Farxiga 10 mg Has [employer] grant to cover   Labs orderd:   Follow up with PharmD  MD

## 2023-05-01 NOTE — Progress Notes (Signed)
Office Visit    Patient Name: Curtis Henry Date of Encounter: 05/01/2023  Primary Care Provider:  Erick Alley, DO Primary Cardiologist:  Little Ishikawa, MD  Chief Complaint    Heart Failure Medication Titration - EF 40-45%% (by echo 12/2022)  Significant Past Medical History   CAD Mild non-obstructive  HTN Controlled with HF medication regimen  Lung cancer Squamous cell - 2019  preDM 1/23 A1c 6    Allergies  Allergen Reactions   Lactose Intolerance (Gi) Nausea Only and Other (See Comments)    Unsettled bowel.     History of Present Illness    Curtis Henry is a 67 y.o. male patient of Dr Bjorn Pippin, in the office today for heart failure medication titration.   Currently on losartan and metoprolol.   He had an episode of AKI earlier this year, SCr increased from 1.07 to 1.85.  Olmesartan hctz was discontinued and a re-check showed a return to baseline.   When I saw him last month we started dapagliflozin, and signed him up for a [employer] grant to help cover medication costs.  (ID [id number], BIN  [id number], PCN  [id number], GRP [id number]).    Today he returns for follow up.  Has not had any concerns since starting the Farxiga samples, and has only checked home BP a few times, but all have looked "good"  Blood Pressure Goal:  130/80  GDMT: ACEI/ARB/ARNI [x] Yes [] No Losartan 50 mg every day (increased 03/04/23)  Beta blocker [x] Yes [] No Metoprolol succ 75  MRA [] Yes [x] No   SGLT2 inhibitor [x] Yes [] No Dapagliflozin 10 mg     Family Hx:   mother died in 67 - pregnancy complication;  father died around 60; 6 siblings, no heart disease; 5 children - 2 daughters with high cholesterol  Social Hx:      Tobacco: vape, marijuana  Alcohol:  rare occasions  Caffeine: no coffee, occasional tea, 1 can of Coke few times per week  Diet:    mix of home and eating out, does use some salt with cooking, occasionally at table; no regular vegetables, does drink V8 juice daily;  snacks regularly - chips, cookies, candy  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 [] Yes [x] No  Do you ever skip doses due to side effects? [] Yes [x] No  Do you have trouble affording your medicines? [x] Yes - Eliquis [] No  Are you ever unable to pick up your medication due to transportation difficulties? [] Yes [x] No    Accessory Clinical Findings    Lab Results  Component Value Date   CREATININE 1.10 03/13/2023   BUN 9 03/13/2023   NA 140 03/13/2023   K 4.4 03/13/2023   CL 107 03/13/2023   CO2 25 03/13/2023   Lab Results  Component Value Date   ALT 19 03/13/2023   AST 32 03/13/2023   ALKPHOS 45 03/13/2023   BILITOT 0.4 03/13/2023   Lab Results  Component Value Date   HGBA1C 6.0 (A) 10/10/2021    Home Medications/Allergies    Current Outpatient Medications  Medication Sig Dispense Refill   dapagliflozin propanediol (FARXIGA) 10 MG TABS tablet Take 1 tablet (10 mg total) by mouth daily before breakfast. 90 tablet 3   sacubitril-valsartan (ENTRESTO) 24-26 MG Take 1 tablet by mouth 2 (two) times daily.  apixaban (ELIQUIS) 5 MG TABS tablet Take 1 tablet (5 mg total) by mouth 2 (two) times daily. 180 tablet 3   atorvastatin (LIPITOR) 80 MG tablet Take 1 tablet (80 mg total) by mouth daily. 90 tablet 1   betamethasone valerate ointment (VALISONE) 0.1 % Apply 1 application topically 2 (two) times daily. (Patient taking differently: Apply 1 application  topically daily.) 30 g 0   gabapentin (NEURONTIN) 600 MG tablet TAKE 1 TABLET BY MOUTH AT BEDTIME 90 tablet 0   hydrALAZINE (APRESOLINE) 25 MG tablet Take 1 tablet (25 mg total) by mouth 3 (three) times daily. 270 tablet 3   metoprolol succinate (TOPROL-XL) 50 MG 24 hr tablet Take 1.5 tablets (75 mg total) by mouth at bedtime. Take with or  immediately following a meal. 135 tablet 3   Multiple Vitamin (MULTIVITAMIN) tablet Take 1 tablet by mouth daily.     Omega-3 Fatty Acids (FISH OIL) 1200 MG CAPS Take 1,200 mg by mouth daily.     No current facility-administered medications for this visit.     Allergies  Allergen Reactions   Lactose Intolerance (Gi) Nausea Only and Other (See Comments)    Unsettled bowel.        Assessment & Plan      Chronic systolic heart failure (HCC) Assessment: BP in office today is 131/73 Patient with  HFmrEF Tolerates current medications without any side effects Denies SOB, palpitation, chest pain, headaches,or edema No concerns with regards to cost of medications, compliance, ability to pick up at pharmacy (has [employer]) Reiterated the importance of low salt diet  Plan: GDMT ACEI/ARB/ARNI Entresto 24/26 Start today, needs BMET 1-2 weeks, [employer] grant to cover  Beta blocker Metoprolol succ 75 mg   MRA  Consider if renal function stable with Entresto  SGLT2 Farxiga 10 mg Has [employer] grant to cover   Labs orderd:   Follow up with PharmD  MD    Phillips Hay PharmD CPP [facility]  [address] [place], [place] [address] [phone]

## 2023-05-01 NOTE — Progress Notes (Deleted)
      [address]       [place] [address]             [phone]        Curtis Henry [facility] Medical Record #[id number] Date of Birth: 03-Dec-1955  Referring: Curtis Alley, DO Primary Care: Curtis Alley, DO Primary Cardiologist:Curtis Karlyne Greenspan, MD  Reason for visit:   follow-up  History of Present Illness:     67 year old male status post left thoracoscopy, and left lower lobectomy for squamous cell carcinoma in 2019 by Dr. Maren Henry presents for his 1 year follow-up.   Physical Exam: There were no vitals taken for this visit.  Alert NAD Abdomen, ND No peripheral edema   Diagnostic Studies & Laboratory data: CT chest: ***    Assessment / Plan:   67 year old male status post left VATS left lower lobectomy for a stage I squamous cell cancer in 2019.  He comes for his 1 year follow-up.  His CT chest is negative for recurrent or metastatic disease.  He is 25yrs out from his original surgery and will rollover to lung cancer screening.    Curtis Henry 05/01/2023 9:06 AM

## 2023-05-01 NOTE — Patient Instructions (Signed)
Follow up appointment: with Dr. Bjorn Pippin next Wednesday  Go to the lab when you come in to see Dr. Bjorn Pippin next Wednesday  Take your meds as follows:    Stop losartan 50 mg  Start Entresto 24/26 mg twice daily  Continue with all other medications  Check your blood pressure at home daily (if able) and keep record of the readings.  Hypertension "High blood pressure"  Hypertension is often called "The Silent Killer." It rarely causes symptoms until it is extremely  high or has done damage to other organs in the body. For this reason, you should have your  blood pressure checked regularly by your physician. We will check your blood pressure  every time you see a provider at one of our offices.   Your blood pressure reading consists of two numbers. Ideally, blood pressure should be  below 120/80. The first ("top") number is called the systolic pressure. It measures the  pressure in your arteries as your heart beats. The second ("bottom") number is called the diastolic pressure. It measures the pressure in your arteries as the heart relaxes between beats.  The benefits of getting your blood pressure under control are enormous. A 10-point  reduction in systolic blood pressure can reduce your risk of stroke by 27% and heart failure by 28%  Your blood pressure goal is < 130/80  To check your pressure at home you will need to:  1. Sit up in a chair, with feet flat on the floor and back supported. Do not cross your ankles or legs. 2. Rest your left arm so that the cuff is about heart level. If the cuff goes on your upper arm,  then just relax the arm on the table, arm of the chair or your lap. If you have a wrist cuff, we  suggest relaxing your wrist against your chest (think of it as Pledging the Flag with the  wrong arm).  3. Place the cuff snugly around your arm, about 1 inch above the crook of your elbow. The  cords should be inside the groove of your elbow.  4. Sit quietly, with  the cuff in place, for about 5 minutes. After that 5 minutes press the power  button to start a reading. 5. Do not talk or move while the reading is taking place.  6. Record your readings on a sheet of paper. Although most cuffs have a memory, it is often  easier to see a pattern developing when the numbers are all in front of you.  7. You can repeat the reading after 1-3 minutes if it is recommended  Make sure your bladder is empty and you have not had caffeine or tobacco within the last 30 min  Always bring your blood pressure log with you to your appointments. If you have not brought your monitor in to be double checked for accuracy, please bring it to your next appointment.  You can find a list of quality blood pressure cuffs at validatebp.org

## 2023-05-02 ENCOUNTER — Encounter (HOSPITAL_COMMUNITY): Payer: Self-pay

## 2023-05-03 ENCOUNTER — Ambulatory Visit: Payer: PPO | Admitting: Thoracic Surgery (Cardiothoracic Vascular Surgery)

## 2023-05-03 ENCOUNTER — Telehealth (HOSPITAL_COMMUNITY): Payer: Self-pay | Admitting: *Deleted

## 2023-05-03 ENCOUNTER — Ambulatory Visit (INDEPENDENT_AMBULATORY_CARE_PROVIDER_SITE_OTHER): Payer: PPO | Admitting: Thoracic Surgery (Cardiothoracic Vascular Surgery)

## 2023-05-03 ENCOUNTER — Ambulatory Visit
Admission: RE | Admit: 2023-05-03 | Discharge: 2023-05-03 | Disposition: A | Discharge status: Home / Self Care | Payer: PPO | Source: Ambulatory Visit | Attending: Thoracic Surgery (Cardiothoracic Vascular Surgery) | Admitting: Thoracic Surgery (Cardiothoracic Vascular Surgery)

## 2023-05-03 DIAGNOSIS — J439 Emphysema, unspecified: Secondary | ICD-10-CM

## 2023-05-03 DIAGNOSIS — Z85118 Personal history of other malignant neoplasm of bronchus and lung: Secondary | ICD-10-CM

## 2023-05-03 DIAGNOSIS — I700 Atherosclerosis of aorta: Secondary | ICD-10-CM

## 2023-05-03 DIAGNOSIS — R198 Other specified symptoms and signs involving the digestive system and abdomen: Secondary | ICD-10-CM | POA: Diagnosis not present

## 2023-05-03 DIAGNOSIS — Z87891 Personal history of nicotine dependence: Secondary | ICD-10-CM

## 2023-05-03 NOTE — Telephone Encounter (Signed)
Reaching out to patient to offer assistance regarding upcoming cardiac imaging study; pt verbalizes understanding of appt date/time, parking situation and where to check in, and verified current allergies; name and call back number provided for further questions should they arise ? ?Merle Prescott RN Navigator Cardiac Imaging ?[facility] ?[phone] office ?[phone] cell ? ?Patient denies metal or claustrophobia. ?

## 2023-05-03 NOTE — Progress Notes (Signed)
     [address]       [place] [address]             [phone]       Patient: Home Provider: Office Consent for Telemedicine visit obtained.  Today's visit was completed via a real-time telehealth (see specific modality noted below). The patient/authorized person provided oral consent at the time of the visit to engage in a telemedicine encounter with the present provider at [facility]. The patient/authorized person was informed of the potential benefits, limitations, and risks of telemedicine. The patient/authorized person expressed understanding that the laws that protect confidentiality also apply to telemedicine. The patient/authorized person acknowledged understanding that telemedicine does not provide emergency services and that he or she would need to call 911 or proceed to the nearest hospital for help if such a need arose.   Total time spent in the clinical discussion 10 minutes.  Telehealth Modality: Phone visit (audio only) IMPRESSION: 1. Stable postsurgical findings of left lower lobectomy. No evidence of recurrent or metastatic disease in the chest. 2. Stable solid and ground-glass pulmonary nodules. Recommend attention on follow-up. 3. Aortic Atherosclerosis (ICD10-I70.0) and Emphysema (ICD10-J43.9).  I had a telephone visit with [name].  He has no complaints.  Images were reviewed and there is no evidence of recurrence.  His other nodules are all stable.  He is 58yrs out from his original surgery for Stage I squamous cell cancer of the left lower lobe with Dr. Donata Clay.  He will no role over the lung cancer screening program for on going surveillance.  [name] [name]

## 2023-05-05 NOTE — Progress Notes (Deleted)
Cardiology Office Note:    Date:  05/05/2023   ID:  Curtis Henry, DOB 07-31-1956, MRN [id number]  PCP:  Erick Alley, DO  Cardiologist:  Little Ishikawa, MD  Electrophysiologist:  None   Referring MD: Erick Alley, DO   No chief complaint on file.   History of Present Illness:    Curtis Henry is a 67 y.o. male with a hx of chronic systolic heart failure, atrial fibrillation/flutter, hypertension, hyperlipidemia, squamous cell lung cancer who presents for follow-up.  He was referred by Dr. Yetta Barre for evaluation of chest pain, initially seen 01/01/2023.  He reports that he has been having chest pain over the last 2 to 3 months.  Reports 2 types of chest pain.  First is in center of his chest and feels like burning, seems related to eating certain foods.  Also reports pain on right side of chest when he is stressed.  No clear relationship with exertion.  Does report that he gets short of breath and has some lightheadedness with exertion.  Reports palpitations where he feels like heart is racing.  Had syncopal episode 5 years ago, none since.  Reports no lower extremity edema.  Reports compliance with Eliquis, denies any bleeding issues.  He smoked for 45 years, about 1 pack/day, quit in 2022.  Quit marijuana use recently.  Occasional alcohol use, less than 1 drink per week.  No family history of heart disease  Zio patch x 14 days 10/2022 showed 1 episode of NSVT lasting 5 beats, 01/22/2020 episodes of SVT with longest lasting 1 minute 37 seconds with average rate 173 bpm, atrial flutter with 2% burden with average rate 140 bpm, longest episode lasting 35 minutes 52 seconds with average rate 149 bpm, frequent PACs (7.7% of beats).  Echocardiogram 12/21/2022 showed EF 40 to 45%, global hypokinesis, indeterminate diastolic function, mildly reduced RV function.  At initial clinic visit, cardiac cath was ordered for workup of chest pain and systolic heart failure.  However his creatinine was 1.85 on  01/01/2023 (increased from 1.07 on 11/28/2022), so catheterization was canceled.  HCTZ and olmesartan were discontinued.  Recheck BMET on 01/14/2023 showed creatinine down to 1.07.  Underwent RHC/LHC on 01/17/2023, which showed mild nonobstructive CAD (20% ostial left main stenosis, 30% ramus), low normal right heart pressures (RA 1, RV 22/1, PA 23/6/16, PCWP 5, CI 2.2).  Cardiac MRI on 05/06/2023 showed***  Since last clinic visit,  he reports that he is doing well.  Reports lightheadedness and shortness of breath have improved.  States that palpitations have improved.  He denies any chest pain.  Denies any lower extremity edema.  Wt Readings from Last 3 Encounters:  03/04/23 128 lb 6.4 oz (58.2 kg)  02/15/23 128 lb 12.8 oz (58.4 kg)  01/17/23 134 lb (60.8 kg)     Past Medical History:  Diagnosis Date   Anxiety state 03/08/2018   Bursitis of left shoulder    Essential hypertension 01/31/2018   In 2019, has not been on medication since this time.   GERD (gastroesophageal reflux disease)    Hyperlipidemia    Restless leg syndrome    Squamous cell lung cancer [facility])     Past Surgical History:  Procedure Laterality Date   CIRCUMCISION  12/13/2003   COLONOSCOPY     RIGHT/LEFT HEART CATH AND CORONARY ANGIOGRAPHY N/A 01/17/2023   Procedure: RIGHT/LEFT HEART CATH AND CORONARY ANGIOGRAPHY;  Surgeon: Lennette Bihari, MD;  Location: [facility];  Service: Cardiovascular;  Laterality: N/A;   VIDEO ASSISTED THORACOSCOPY (VATS)/ LOBECTOMY Left 01/17/2018   Procedure: left VIDEO ASSISTED THORACOSCOPY (VATS) with left lower lobe wedge resection and LOBECTOMY;  Surgeon: Kerin Perna, MD;  Location: [facility] OR;  Service: Thoracic;  Laterality: Left;    Current Medications: No outpatient medications have been marked as taking for the 05/08/23 encounter (Appointment) with Little Ishikawa, MD.     Allergies:   Lactose intolerance (gi)   Social History   Socioeconomic History   Marital status:  Widowed    Spouse name: Not on file   Number of children: 5   Years of education: Not on file   Highest education level: Not on file  Occupational History   Occupation: [employer]  Tobacco Use   Smoking status: Former    Current packs/day: 0.00    Average packs/day: 0.1 packs/day for 46.0 years (4.6 ttl pk-yrs)    Types: Cigarettes    Start date: 09/17/1974    Quit date: 2022    Years since quitting: 2.6   Smokeless tobacco: Never  Vaping Use   Vaping status: Some Days   Substances: Nicotine, Flavoring  Substance and Sexual Activity   Alcohol use: Not Currently   Drug use: Yes    Frequency: 7.0 times per week    Types: Marijuana    Comment: smokes marijuana; crack use distant past >30 yrs ago   Sexual activity: Not Currently  Other Topics Concern   Not on file  Social History Narrative   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    Social Determinants of Health   Financial Resource Strain: Medium Risk (12/25/2022)   Overall Financial Resource Strain (CARDIA)    Difficulty of Paying Living Expenses: Somewhat hard  Food Insecurity: Food Insecurity Present (12/25/2022)   Hunger Vital Sign    Worried About Running Out of Food in the Last Year: Sometimes true    Ran Out of Food in the Last Year: Sometimes true  Transportation Needs: No Transportation Needs (12/25/2022)   PRAPARE - Administrator, Civil Service (Medical): No    Lack of Transportation (Non-Medical): No  Physical Activity: Inactive (12/25/2022)   Exercise Vital Sign    Days of Exercise per Week: 0 days    Minutes of Exercise per Session: 0 min  Stress: Stress Concern Present (12/25/2022)   [employer] of Occupational Health - Occupational Stress Questionnaire    Feeling of Stress : To some extent  Social Connections: Socially Isolated (12/25/2022)   Social Connection and Isolation Panel [NHANES]    Frequency of Communication with Friends and Family: More than three times a week    Frequency of Social  Gatherings with Friends and Family: More than three times a week    Attends Religious Services: Never    Database administrator or Organizations: No    Attends Banker Meetings: Never    Marital Status: Widowed     Family History: The patient's family history includes Arthritis in an other family member; Colon cancer (age of onset: 44) in his father; Esophageal cancer in his brother.  ROS:   Please see the history of present illness.     All other systems reviewed and are negative.  EKGs/Labs/Other Studies Reviewed:    The following studies were reviewed today:   EKG:   01/01/2023: Normal sinus rhythm, rate 65, no ST abnormalities 02/15/2023: Normal sinus rhythm, rate 61, no ST abnormalities  Recent Labs: 09/14/2022: TSH 0.887  03/13/2023: ALT 19; BUN 9; Creatinine, Ser 1.10; Hemoglobin 13.1; Magnesium 2.1; Platelets 224; Potassium 4.4; Sodium 140  Recent Lipid Panel    Component Value Date/Time   CHOL 163 02/15/2023 1017   TRIG 61 02/15/2023 1017   HDL 62 02/15/2023 1017   CHOLHDL 2.6 02/15/2023 1017   CHOLHDL 3.9 04/18/2010 0545   VLDL 12 04/18/2010 0545   LDLCALC 89 02/15/2023 1017    Physical Exam:    VS:  There were no vitals taken for this visit.    Wt Readings from Last 3 Encounters:  03/04/23 128 lb 6.4 oz (58.2 kg)  02/15/23 128 lb 12.8 oz (58.4 kg)  01/17/23 134 lb (60.8 kg)     GEN:  Well nourished, well developed in no acute distress HEENT: Normal NECK: No JVD; No carotid bruits LYMPHATICS: No lymphadenopathy CARDIAC: RRR, no murmurs, rubs, gallops RESPIRATORY:  Clear to auscultation without rales, wheezing or rhonchi  ABDOMEN: Soft, non-tender, non-distended MUSCULOSKELETAL:  No edema; No deformity  SKIN: Warm and dry NEUROLOGIC:  Alert and oriented x 3 PSYCHIATRIC:  Normal affect   ASSESSMENT:    No diagnosis found.   PLAN:    Chronic systolic heart failure:Echocardiogram 12/21/2022 showed EF 40 to 45%, global hypokinesis,  indeterminate diastolic function, mildly reduced RV function.  Reported atypical chest pain.  Underwent RHC/LHC on 01/17/2023, which showed mild nonobstructive CAD (20% ostial left main stenosis, 30% ramus), low normal right heart pressures (RA 1, RV 22/1, PA 23/6/16, PCWP 5, CI 2.2).  Cardiac MRI on 05/06/2023 showed*** -Continue Toprol-XL 75 mg daily -Continue hydralazine 25 mg 3 times daily and Imdur 30 mg daily -Continue Farxiga 10 mg daily -Continue Entresto 24-26 mg twice daily.  Check BMET -Add spironolactone 12.5 mg daily***  CAD: mild nonobstructive CAD (20% ostial left main stenosis, 30% ramus) on cath 01/17/2023 -Continue Eliquis -On atorvastatin 80 mg daily  AKI: creatinine was 1.85 on 01/01/2023 (increased from 1.07 on 11/28/2022).  HCTZ and olmesartan were discontinued.  Recheck BMET on 01/11/2023 showed improvement in creatinine to 1.07.  Check BMET  Atrial flutter: Zio patch x 14 days 10/2022 showed 1 episode of NSVT lasting 5 beats, 01/22/2020 episodes of SVT with longest lasting 1 minute 37 seconds with average rate 173 bpm, atrial flutter with 2% burden with average rate 140 bpm, longest episode lasting 35 minutes 52 seconds with average rate 149 bpm, frequent PACs (7.7% of beats).   -CHA2DS2-VASc 3 (CHF, hypertension, age).  Continue Eliquis 5 mg twice daily -Continue Toprol-XL 75 mg daily  SVT/frequent PACs: ZIO monitor results 10/2022 as above.  Continue Toprol-XL 75 mg daily  Hypertension: On Entresto 24-26 mg twice daily hydralazine 25 mg 3 times daily,Toprol-XL 75mg  daily.    Hyperlipidemia: On atorvastatin 40 mg daily, LDL 89 on 02/15/2023.  Atorvastatin increased to 80 mg daily for goal LDL less than 70 given CAD as above   RTC in 2 months***  Medication Adjustments/Labs and Tests Ordered: Current medicines are reviewed at length with the patient today.  Concerns regarding medicines are outlined above.  No orders of the defined types were placed in this encounter.  No  orders of the defined types were placed in this encounter.   There are no Patient Instructions on file for this visit.   Signed, Little Ishikawa, MD  05/05/2023 2:54 PM    [facility]

## 2023-05-06 ENCOUNTER — Other Ambulatory Visit: Payer: Self-pay | Admitting: Cardiology

## 2023-05-06 ENCOUNTER — Encounter: Payer: Self-pay | Admitting: Cardiology

## 2023-05-06 ENCOUNTER — Ambulatory Visit (HOSPITAL_COMMUNITY)
Admission: RE | Admit: 2023-05-06 | Discharge: 2023-05-06 | Disposition: A | Discharge status: Home / Self Care | Payer: PPO | Source: Ambulatory Visit | Attending: Cardiology | Admitting: Cardiology

## 2023-05-06 DIAGNOSIS — I5022 Chronic systolic (congestive) heart failure: Secondary | ICD-10-CM

## 2023-05-06 MED ORDER — GADOBUTROL 1 MMOL/ML IV SOLN
9.0000 mL | Freq: Once | INTRAVENOUS | Status: AC | PRN
  Administered 2023-05-06: 9 mL via INTRAVENOUS

## 2023-05-08 ENCOUNTER — Encounter: Payer: Self-pay | Admitting: Cardiology

## 2023-05-08 ENCOUNTER — Ambulatory Visit: Payer: PPO | Attending: Cardiology | Admitting: Cardiology

## 2023-05-12 NOTE — Progress Notes (Unsigned)
Cardiology Clinic Note   Patient Name: Curtis Henry Date of Encounter: 05/13/2023  Primary Care Provider:  Erick Alley, DO Primary Cardiologist:  Little Ishikawa, MD  Patient Profile    Curtis Henry 67 year old male presents to the clinic today for follow-up evaluation of his chronic systolic CHF.  Past Medical History    Past Medical History:  Diagnosis Date   Anxiety state 03/08/2018   Bursitis of left shoulder    Essential hypertension 01/31/2018   In 2019, has not been on medication since this time.   GERD (gastroesophageal reflux disease)    Hyperlipidemia    Restless leg syndrome    Squamous cell lung cancer [facility])    Past Surgical History:  Procedure Laterality Date   CIRCUMCISION  12/13/2003   COLONOSCOPY     RIGHT/LEFT HEART CATH AND CORONARY ANGIOGRAPHY N/A 01/17/2023   Procedure: RIGHT/LEFT HEART CATH AND CORONARY ANGIOGRAPHY;  Surgeon: Lennette Bihari, MD;  Location: [facility];  Service: Cardiovascular;  Laterality: N/A;   VIDEO ASSISTED THORACOSCOPY (VATS)/ LOBECTOMY Left 01/17/2018   Procedure: left VIDEO ASSISTED THORACOSCOPY (VATS) with left lower lobe wedge resection and LOBECTOMY;  Surgeon: Kerin Perna, MD;  Location: [facility] OR;  Service: Thoracic;  Laterality: Left;    Allergies  Allergies  Allergen Reactions   Lactose Intolerance (Gi) Nausea Only and Other (See Comments)    Unsettled bowel.     History of Present Illness    Curtis Henry has a PMH of chronic systolic CHF, atrial fibrillation/flutter, HLD, HTN, and squamous cell lung cancer.  He was referred for evaluation by his PCP.  He reported chest pain.  He reported 2 different types of chest discomfort.  1 was reported as burning type pain in the center of his chest.  He also reported right sided chest discomfort with increased stress.  He denied exertional chest pain.  5 years ago he had a syncopal episode.  He denied lower extremity swelling.  He quit smoking after 45 years  and 2022.  He also stopped smoking marijuana.  He wore a cardiac event monitor 2/24 which showed 1 episode of NSVT that lasted 5 beats, 5/21 he had an episode of SVT that lasted 1 minute and 37 seconds.  His echocardiogram 01/19/2023 showed an EF of 40-45%, global hypokinesis, intermediate diastolic parameters, and mildly reduced RV function.  During his initial clinic visit cardiac catheterization was ordered for chest pain and systolic heart failure.  His creatinine was 1.85 and his cardiac catheterization was canceled.  His HCTZ and olmesartan were discontinued.  Repeat be met 01/14/2023 showed creatinine down to 1.07.  He underwent right and left heart cath on 5/24 which showed mild nonobstructive CAD.  He was noted to have low normal right heart pressures.  He was last seen by Dr. Bjorn Henry 03/04/2023.  He was doing well at that time.  He reported lightheadedness and shortness of breath that had improved.  He had an improvement with his palpitations.  He denied chest pain and lower extremity swelling.  He has been following with pharmacy for up titration of his GDMT.  He was last seen by pharmacy on 05/01/2023.  His Marcelline Deist was continued.  His blood pressure was noted to be 137/76.  His heart rate was 60.  He was started on Entresto 24/26.  Plan for adding spironolactone was recommended if renal function continued to be stable.  He presents to the clinic today for follow-up evaluation and  states he feels he is about 70 to 80%.  We reviewed his echocardiogram and cardiac MRI.  He expressed understanding.  I am unable to uptitrate GDMT today due to blood pressure.  I will order BMP, encouraged heart healthy high-fiber low-sodium diet, and we will plan follow-up in 3 to 4 months.  Today he denies chest pain, shortness of breath, lower extremity edema, fatigue, palpitations, melena, hematuria, hemoptysis, diaphoresis, weakness, presyncope, syncope, orthopnea, and PND.    Home Medications    Prior to  Admission medications   Medication Sig Start Date End Date Taking? Authorizing Provider  apixaban (ELIQUIS) 5 MG TABS tablet Take 1 tablet (5 mg total) by mouth 2 (two) times daily. 11/28/22   Erick Alley, DO  atorvastatin (LIPITOR) 80 MG tablet Take 1 tablet (80 mg total) by mouth daily. 02/19/23   Little Ishikawa, MD  betamethasone valerate ointment (VALISONE) 0.1 % Apply 1 application topically 2 (two) times daily. Patient taking differently: Apply 1 application  topically daily. 10/10/21   Alicia Amel, MD  dapagliflozin propanediol (FARXIGA) 10 MG TABS tablet Take 1 tablet (10 mg total) by mouth daily before breakfast. 05/01/23   O'Neal, Ronnald Ramp, MD  gabapentin (NEURONTIN) 600 MG tablet TAKE 1 TABLET BY MOUTH AT BEDTIME 04/19/23   Erick Alley, DO  hydrALAZINE (APRESOLINE) 25 MG tablet Take 1 tablet (25 mg total) by mouth 3 (three) times daily. 01/02/23 12/28/23  Little Ishikawa, MD  metoprolol succinate (TOPROL-XL) 50 MG 24 hr tablet Take 1.5 tablets (75 mg total) by mouth at bedtime. Take with or immediately following a meal. 01/14/23   Little Ishikawa, MD  Multiple Vitamin (MULTIVITAMIN) tablet Take 1 tablet by mouth daily.    [provider]  Omega-3 Fatty Acids (FISH OIL) 1200 MG CAPS Take 1,200 mg by mouth daily.    [provider]  sacubitril-valsartan (ENTRESTO) 24-26 MG Take 1 tablet by mouth 2 (two) times daily. 05/01/23   Little Ishikawa, MD    Family History    Family History  Problem Relation Age of Onset   Esophageal cancer Brother    Colon cancer Father 61   Arthritis Other    He indicated that his mother is deceased. He indicated that his father is deceased. He indicated that the status of his brother is unknown. He indicated that his maternal grandmother is deceased. He indicated that his maternal grandfather is deceased. He indicated that his paternal grandmother is deceased. He indicated that his paternal grandfather is  deceased. He indicated that the status of his other is unknown.  Social History    Social History   Socioeconomic History   Marital status: Widowed    Spouse name: Not on file   Number of children: 5   Years of education: Not on file   Highest education level: Not on file  Occupational History   Occupation: [employer]  Tobacco Use   Smoking status: Former    Current packs/day: 0.00    Average packs/day: 0.1 packs/day for 46.0 years (4.6 ttl pk-yrs)    Types: Cigarettes    Start date: 09/17/1974    Quit date: 2022    Years since quitting: 2.6   Smokeless tobacco: Never  Vaping Use   Vaping status: Some Days   Substances: Nicotine, Flavoring  Substance and Sexual Activity   Alcohol use: Not Currently   Drug use: Yes    Frequency: 7.0 times per week    Types: Marijuana  Comment: smokes marijuana; crack use distant past >30 yrs ago   Sexual activity: Not Currently  Other Topics Concern   Not on file  Social History Narrative   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    Social Determinants of Health   Financial Resource Strain: Medium Risk (12/25/2022)   Overall Financial Resource Strain (CARDIA)    Difficulty of Paying Living Expenses: Somewhat hard  Food Insecurity: Food Insecurity Present (12/25/2022)   Hunger Vital Sign    Worried About Running Out of Food in the Last Year: Sometimes true    Ran Out of Food in the Last Year: Sometimes true  Transportation Needs: No Transportation Needs (12/25/2022)   PRAPARE - Administrator, Civil Service (Medical): No    Lack of Transportation (Non-Medical): No  Physical Activity: Inactive (12/25/2022)   Exercise Vital Sign    Days of Exercise per Week: 0 days    Minutes of Exercise per Session: 0 min  Stress: Stress Concern Present (12/25/2022)   [employer] of Occupational Health - Occupational Stress Questionnaire    Feeling of Stress : To some extent  Social Connections: Socially Isolated (12/25/2022)   Social  Connection and Isolation Panel [NHANES]    Frequency of Communication with Friends and Family: More than three times a week    Frequency of Social Gatherings with Friends and Family: More than three times a week    Attends Religious Services: Never    Database administrator or Organizations: No    Attends Banker Meetings: Never    Marital Status: Widowed  Intimate Partner Violence: Not At Risk (12/25/2022)   Humiliation, Afraid, Rape, and Kick questionnaire    Fear of Current or Ex-Partner: No    Emotionally Abused: No    Physically Abused: No    Sexually Abused: No     Review of Systems    General:  No chills, fever, night sweats or weight changes.  Cardiovascular:  No chest pain, dyspnea on exertion, edema, orthopnea, palpitations, paroxysmal nocturnal dyspnea. Dermatological: No rash, lesions/masses Respiratory: No cough, dyspnea Urologic: No hematuria, dysuria Abdominal:   No nausea, vomiting, diarrhea, bright red blood per rectum, melena, or hematemesis Neurologic:  No visual changes, wkns, changes in mental status. All other systems reviewed and are otherwise negative except as noted above.  Physical Exam    VS:  BP (!) 105/52 (BP Location: Left Arm, Patient Position: Sitting, Cuff Size: Normal)   Pulse 62   Ht 6' (1.829 m)   Wt 126 lb (57.2 kg)   SpO2 98%   BMI 17.09 kg/m  , BMI Body mass index is 17.09 kg/m. GEN: Well nourished, well developed, in no acute distress. HEENT: normal. Neck: Supple, no JVD, carotid bruits, or masses. Cardiac: RRR, no murmurs, rubs, or gallops. No clubbing, cyanosis, edema.  Radials/DP/PT 2+ and equal bilaterally.  Respiratory:  Respirations regular and unlabored, clear to auscultation bilaterally. GI: Soft, nontender, nondistended, BS + x 4. MS: no deformity or atrophy. Skin: warm and dry, no rash. Neuro:  Strength and sensation are intact. Psych: Normal affect.  Accessory Clinical Findings    Recent Labs: 09/14/2022:  TSH 0.887 03/13/2023: ALT 19; BUN 9; Creatinine, Ser 1.10; Hemoglobin 13.1; Magnesium 2.1; Platelets 224; Potassium 4.4; Sodium 140   Recent Lipid Panel    Component Value Date/Time   CHOL 163 02/15/2023 1017   TRIG 61 02/15/2023 1017   HDL 62 02/15/2023 1017   CHOLHDL 2.6 02/15/2023 1017  CHOLHDL 3.9 04/18/2010 0545   VLDL 12 04/18/2010 0545   LDLCALC 89 02/15/2023 1017         ECG personally reviewed by me today-none today.    Echocardiogram 12/21/2022  IMPRESSIONS     1. Left ventricular ejection fraction, by estimation, is 40 to 45%, with  significant variability beat to beat. The left ventricle has mildly  decreased function. The left ventricle demonstrates global hypokinesis.  There is mild left ventricular  hypertrophy. Left ventricular diastolic parameters are indeterminate.   2. Right ventricular systolic function is mildly reduced. The right  ventricular size is normal. Tricuspid regurgitation signal is inadequate  for assessing PA pressure.   3. Borderline bileaflet mitral valve prolapse.. The mitral valve is  grossly normal. Trivial mitral valve regurgitation.   4. The aortic valve is tricuspid. Aortic valve regurgitation is not  visualized.   5. The inferior vena cava is normal in size with greater than 50%  respiratory variability, suggesting right atrial pressure of 3 mmHg.   FINDINGS   Left Ventricle: Left ventricular ejection fraction, by estimation, is 40  to 45%. The left ventricle has mildly decreased function. The left  ventricle demonstrates global hypokinesis. The left ventricular internal  cavity size was normal in size. There is   mild left ventricular hypertrophy. Left ventricular diastolic parameters  are indeterminate.   Right Ventricle: The right ventricular size is normal. No increase in  right ventricular wall thickness. Right ventricular systolic function is  mildly reduced. Tricuspid regurgitation signal is inadequate for assessing  PA  pressure. The tricuspid  regurgitant velocity is 1.85 m/s, and with an assumed right atrial  pressure of 3 mmHg, the estimated right ventricular systolic pressure is  16.7 mmHg.   Left Atrium: Left atrial size was normal in size.   Right Atrium: Right atrial size was normal in size.   Pericardium: There is no evidence of pericardial effusion.   Mitral Valve: Borderline bileaflet mitral valve prolapse. The mitral valve  is grossly normal. Trivial mitral valve regurgitation.   Tricuspid Valve: The tricuspid valve is normal in structure. Tricuspid  valve regurgitation is trivial. No evidence of tricuspid stenosis.   Aortic Valve: The aortic valve is tricuspid. Aortic valve regurgitation is  not visualized. Aortic valve mean gradient measures 2.0 mmHg. Aortic valve  peak gradient measures 4.6 mmHg. Aortic valve area, by VTI measures 2.75  cm.   Pulmonic Valve: The pulmonic valve was grossly normal. Pulmonic valve  regurgitation is trivial.   Aorta: The aortic root is normal in size and structure.   Venous: The inferior vena cava is normal in size with greater than 50%  respiratory variability, suggesting right atrial pressure of 3 mmHg.   IAS/Shunts: The interatrial septum was not well visualized.        Assessment & Plan   1.  Chronic systolic CHF-tolerating Entresto well.  Blood pressure today 105/52.  No increased DOE or activity intolerance.  Euvolemic.  Weight stable.  Echocardiogram 4/24 showed an EF of 40-45%. Continue Sherryll Burger, Farxiga, metoprolol Order BMP Unable to uptitrate GDMT at this time due to blood pressure.  Essential hypertension-BP today 105/52. Maintain blood pressure log Heart healthy low-sodium diet Increase physical activity as tolerated  Atrial fibrillation/flutter-heart rate today 62.  Reports compliance with apixaban.  Denies bleeding issues.  CHA2DS2-VASc score 3. Continue metoprolol, apixaban Avoid triggers caffeine, chocolate, EtOH,  dehydration etc.  Hyperlipidemia-LDL 89 on 02/15/23.  Goal will less than 70 High-fiber diet-reviewed importance of limiting  high cholesterol foods Increase physical activity as tolerated Continue atorvastatin  Disposition: Follow-up with Dr. Bjorn Henry or me in 3-4 months.   Thomasene Ripple. Ammarie Matsuura NP-C     05/13/2023, 4:05 PM [facility] [address] Office [phone] Fax [phone]    I spent 14 minutes examining this patient, reviewing medications, and using patient centered shared decision making involving her cardiac care.  Prior to her visit I spent greater than 20 minutes reviewing her past medical history,  medications, and prior cardiac tests.

## 2023-05-13 ENCOUNTER — Encounter: Payer: Self-pay | Admitting: General Practice

## 2023-05-13 ENCOUNTER — Ambulatory Visit: Payer: PPO | Attending: Cardiology | Admitting: General Practice

## 2023-05-13 VITALS — BP 105/52 | HR 62 | Ht 72.0 in | Wt 126.0 lb

## 2023-05-13 DIAGNOSIS — I4892 Unspecified atrial flutter: Secondary | ICD-10-CM

## 2023-05-13 DIAGNOSIS — E785 Hyperlipidemia, unspecified: Secondary | ICD-10-CM

## 2023-05-13 DIAGNOSIS — I5022 Chronic systolic (congestive) heart failure: Secondary | ICD-10-CM

## 2023-05-13 DIAGNOSIS — I10 Essential (primary) hypertension: Secondary | ICD-10-CM

## 2023-05-13 NOTE — Patient Instructions (Signed)
Medication Instructions:  The current medical regimen is effective;  continue present plan and medications as directed. Please refer to the Current Medication list given to you today. *If you need a refill on your cardiac medications before your next appointment, please call your pharmacy*  Lab Work: BMET If you have labs (blood work) drawn today and your tests are completely normal, you will receive your results only by: MyChart Message (if you have MyChart) OR A paper copy in the mail If you have any lab test that is abnormal or we need to change your treatment, we will call you to review the results.  Testing/Procedures: NONE  Follow-Up: At [facility], you and your health needs are our priority.  As part of our continuing mission to provide you with exceptional heart care, we have created designated Provider Care Teams.  These Care Teams include your primary Cardiologist (physician) and Advanced Practice Providers (APPs -  Physician Assistants and Nurse Practitioners) who all work together to provide you with the care you need, when you need it.  Your next appointment:   3-4 month(s)  Provider:   Little Ishikawa, MD  or Edd Fabian, FNP        Other Instructions LOW SALT DIET INCREASED FIBER DIET INCREASE PHYSICAL ACTIVITY AS TOLERATED    DASH Eating Plan DASH stands for Dietary Approaches to Stop Hypertension. The DASH eating plan is a healthy eating plan that has been shown to: Lower high blood pressure (hypertension). Reduce your risk for type 2 diabetes, heart disease, and stroke. Help with weight loss. What are tips for following this plan? Reading food labels Check food labels for the amount of salt (sodium) per serving. Choose foods with less than 5 percent of the Daily Value (DV) of sodium. In general, foods with less than 300 milligrams (mg) of sodium per serving fit into this eating plan. To find whole grains, look for the word "whole" as the  first word in the ingredient list. Shopping Buy products labeled as "low-sodium" or "no salt added." Buy fresh foods. Avoid canned foods and pre-made or frozen meals. Cooking Try not to add salt when you cook. Use salt-free seasonings or herbs instead of table salt or sea salt. Check with your health care provider or pharmacist before using salt substitutes. Do not fry foods. Cook foods in healthy ways, such as baking, boiling, grilling, roasting, or broiling. Cook using oils that are good for your heart. These include olive, canola, avocado, soybean, and sunflower oil. Meal planning  Eat a balanced diet. This should include: 4 or more servings of fruits and 4 or more servings of vegetables each day. Try to fill half of your plate with fruits and vegetables. 6-8 servings of whole grains each day. 6 or less servings of lean meat, poultry, or fish each day. 1 oz is 1 serving. A 3 oz (85 g) serving of meat is about the same size as the palm of your hand. One egg is 1 oz (28 g). 2-3 servings of low-fat dairy each day. One serving is 1 cup (237 mL). 1 serving of nuts, seeds, or beans 5 times each week. 2-3 servings of heart-healthy fats. Healthy fats called omega-3 fatty acids are found in foods such as walnuts, flaxseeds, fortified milks, and eggs. These fats are also found in cold-water fish, such as sardines, salmon, and mackerel. Limit how much you eat of: Canned or prepackaged foods. Food that is high in trans fat, such as fried foods.  Food that is high in saturated fat, such as fatty meat. Desserts and other sweets, sugary drinks, and other foods with added sugar. Full-fat dairy products. Do not salt foods before eating. Do not eat more than 4 egg yolks a week. Try to eat at least 2 vegetarian meals a week. Eat more home-cooked food and less restaurant, buffet, and fast food. Lifestyle When eating at a restaurant, ask if your food can be made with less salt or no salt. If you drink  alcohol: Limit how much you have to: 0-1 drink a day if you are male. 0-2 drinks a day if you are male. Know how much alcohol is in your drink. In the [place] one drink is one 12 oz bottle of beer (355 mL), one 5 oz glass of wine (148 mL), or one 1 oz glass of hard liquor (44 mL). General information Avoid eating more than 2,300 mg of salt a day. If you have hypertension, you may need to reduce your sodium intake to 1,500 mg a day. Work with your provider to stay at a healthy body weight or lose weight. Ask what the best weight range is for you. On most days of the week, get at least 30 minutes of exercise that causes your heart to beat faster. This may include walking, swimming, or biking. Work with your provider or dietitian to adjust your eating plan to meet your specific calorie needs. What foods should I eat? Fruits All fresh, dried, or frozen fruit. Canned fruits that are in their natural juice and do not have sugar added to them. Vegetables Fresh or frozen vegetables that are raw, steamed, roasted, or grilled. Low-sodium or reduced-sodium tomato and vegetable juice. Low-sodium or reduced-sodium tomato sauce and tomato paste. Low-sodium or reduced-sodium canned vegetables. Grains Whole-grain or whole-wheat bread. Whole-grain or whole-wheat pasta. Brown rice. Orpah Cobb. Bulgur. Whole-grain and low-sodium cereals. Pita bread. Low-fat, low-sodium crackers. Whole-wheat flour tortillas. Meats and other proteins Skinless chicken or [place]. Ground chicken or [place]. Pork with fat trimmed off. Fish and seafood. Egg whites. Dried beans, peas, or lentils. Unsalted nuts, nut butters, and seeds. Unsalted canned beans. Lean cuts of beef with fat trimmed off. Low-sodium, lean precooked or cured meat, such as sausages or meat loaves. Dairy Low-fat (1%) or fat-free (skim) milk. Reduced-fat, low-fat, or fat-free cheeses. Nonfat, low-sodium ricotta or cottage cheese. Low-fat or nonfat yogurt. Low-fat,  low-sodium cheese. Fats and oils Soft margarine without trans fats. Vegetable oil. Reduced-fat, low-fat, or light mayonnaise and salad dressings (reduced-sodium). Canola, safflower, olive, avocado, soybean, and sunflower oils. Avocado. Seasonings and condiments Herbs. Spices. Seasoning mixes without salt. Other foods Unsalted popcorn and pretzels. Fat-free sweets. The items listed above may not be all the foods and drinks you can have. Talk to a dietitian to learn more. What foods should I avoid? Fruits Canned fruit in a light or heavy syrup. Fried fruit. Fruit in cream or butter sauce. Vegetables Creamed or fried vegetables. Vegetables in a cheese sauce. Regular canned vegetables that are not marked as low-sodium or reduced-sodium. Regular canned tomato sauce and paste that are not marked as low-sodium or reduced-sodium. Regular tomato and vegetable juices that are not marked as low-sodium or reduced-sodium. Rosita Fire. Olives. Grains Baked goods made with fat, such as croissants, muffins, or some breads. Dry pasta or rice meal packs. Meats and other proteins Fatty cuts of meat. Ribs. Fried meat. Tomasa Blase. Bologna, salami, and other precooked or cured meats, such as sausages or meat loaves, that are not  lean and low in sodium. Fat from the back of a pig (fatback). Bratwurst. Salted nuts and seeds. Canned beans with added salt. Canned or smoked fish. Whole eggs or egg yolks. Chicken or [place] with skin. Dairy Whole or 2% milk, cream, and half-and-half. Whole or full-fat cream cheese. Whole-fat or sweetened yogurt. Full-fat cheese. Nondairy creamers. Whipped toppings. Processed cheese and cheese spreads. Fats and oils Butter. Stick margarine. Lard. Shortening. Ghee. Bacon fat. Tropical oils, such as coconut, palm kernel, or palm oil. Seasonings and condiments Onion salt, garlic salt, seasoned salt, table salt, and sea salt. Worcestershire sauce. Tartar sauce. Barbecue sauce. Teriyaki sauce. Soy sauce,  including reduced-sodium soy sauce. Steak sauce. Canned and packaged gravies. Fish sauce. Oyster sauce. Cocktail sauce. Store-bought horseradish. Ketchup. Mustard. Meat flavorings and tenderizers. Bouillon cubes. Hot sauces. Pre-made or packaged marinades. Pre-made or packaged taco seasonings. Relishes. Regular salad dressings. Other foods Salted popcorn and pretzels. The items listed above may not be all the foods and drinks you should avoid. Talk to a dietitian to learn more. Where to find more information National Heart, Lung, and Blood Institute (NHLBI): BuffaloDryCleaner.gl American Heart Association (AHA): heart.org Academy of Nutrition and Dietetics: eatright.org National Kidney Foundation (NKF): kidney.org This information is not intended to replace advice given to you by your health care provider. Make sure you discuss any questions you have with your health care provider. Document Revised: 09/20/2022 Document Reviewed: 09/20/2022 Elsevier Patient Education  2024 Elsevier Inc.  High-Fiber Eating Plan Fiber, also called dietary fiber, is found in foods such as fruits, vegetables, whole grains, and beans. A high-fiber diet can be good for your health. Your health care provider may recommend a high-fiber diet to help: Prevent trouble pooping (constipation). Lower your cholesterol. Treat the following conditions: Hemorrhoids. This is inflammation of veins in the anus. Inflammation of specific areas of the digestive tract. Irritable bowel syndrome (IBS). This is a problem of the large intestine, also called the colon, that sometimes causes belly pain and bloating. Prevent overeating as part of a weight-loss plan. Lower the risk of heart disease, type 2 diabetes, and certain cancers. What are tips for following this plan? Reading food labels  Check the nutrition facts label on foods for the amount of dietary fiber. Choose foods that have 4 grams of fiber or more per serving. The recommended  goals for how much fiber you should eat each day include: Males 38 years old or younger: 30-34 g. Males over 53 years old: 28-34 g. Females 64 years old or younger: 25-28 g. Females over 63 years old: 22-25 g. Your daily fiber goal is _____________ g. Shopping Choose whole fruits and vegetables instead of processed. For example, choose apples instead of apple juice or applesauce. Choose a variety of high-fiber foods such as avocados, lentils, oats, and pinto beans. Read the nutrition facts label on foods. Check for foods with added fiber. These foods often have high sugar and salt (sodium) amounts per serving. Cooking Use whole-grain flour for baking and cooking. Cook with brown rice instead of white rice. Make meals that have a lot of beans and vegetables in them, such as chili or vegetable-based soups. Meal planning Start the day with a breakfast that is high in fiber, such as a cereal that has 5 g of fiber or more per serving. Eat breads and cereals that are made with whole-grain flour instead of refined flour or white flour. Eat brown rice, bulgur wheat, or millet instead of white rice. Use beans in  place of meat in soups, salads, and pasta dishes. Be sure that half of the grains you eat each day are whole grains. General information You can get the recommended amount of dietary fiber by: Eating a variety of fruits, vegetables, grains, nuts, and beans. Taking a fiber supplement if you aren't able to eat enough fiber. It's better to get fiber through food than from a supplement. Slowly increase how much fiber you eat. If you increase the amount of fiber you eat too quickly, you may have bloating, cramping, or gas. Drink plenty of water to help you digest fiber. Choose high-fiber snacks, such as berries, raw vegetables, nuts, and popcorn. What foods should I eat? Fruits Berries. Pears. Apples. Oranges. Avocado. Prunes and raisins. Dried figs. Vegetables Sweet potatoes. Spinach. Kale.  Artichokes. Cabbage. Broccoli. Cauliflower. Green peas. Carrots. Squash. Grains Whole-grain breads. Multigrain cereal. Oats and oatmeal. Brown rice. Barley. Bulgur wheat. Millet. Quinoa. Bran muffins. Popcorn. Rye wafer crackers. Meats and other proteins Navy beans, kidney beans, and pinto beans. Soybeans. Split peas. Lentils. Nuts and seeds. Dairy Fiber-fortified yogurt. Fortified means that fiber has been added to the product. Beverages Fiber-fortified soy milk. Fiber-fortified orange juice. Other foods Fiber bars. The items listed above may not be all the foods and drinks you can have. Talk to a dietitian to learn more. What foods should I avoid? Fruits Fruit juice. Cooked, strained fruit. Vegetables Fried potatoes. Canned vegetables. Well-cooked vegetables. Grains White bread. Pasta made with refined flour. White rice. Meats and other proteins Fatty meat. Fried chicken or fried fish. Dairy Milk. Cream cheese. Sour cream. Fats and oils Butters. Beverages Soft drinks. Other foods Cakes and pastries. The items listed above may not be all the foods and drinks you should avoid. Talk to a dietitian to learn more. This information is not intended to replace advice given to you by your health care provider. Make sure you discuss any questions you have with your health care provider. Document Revised: 11/26/2022 Document Reviewed: 11/26/2022 Elsevier Patient Education  2024 [employer].

## 2023-05-14 ENCOUNTER — Other Ambulatory Visit: Payer: Self-pay

## 2023-05-14 DIAGNOSIS — I10 Essential (primary) hypertension: Secondary | ICD-10-CM

## 2023-05-14 DIAGNOSIS — I4892 Unspecified atrial flutter: Secondary | ICD-10-CM

## 2023-05-14 DIAGNOSIS — E785 Hyperlipidemia, unspecified: Secondary | ICD-10-CM

## 2023-05-14 DIAGNOSIS — I5022 Chronic systolic (congestive) heart failure: Secondary | ICD-10-CM

## 2023-05-14 LAB — BASIC METABOLIC PANEL
BUN/Creatinine Ratio: 9 — ABNORMAL LOW (ref 10–24)
BUN: 11 mg/dL (ref 8–27)
CO2: 26 mmol/L (ref 20–29)
Calcium: 9.3 mg/dL (ref 8.6–10.2)
Chloride: 100 mmol/L (ref 96–106)
Creatinine, Ser: 1.28 mg/dL — ABNORMAL HIGH (ref 0.76–1.27)
Glucose: 83 mg/dL (ref 70–99)
Potassium: 4.8 mmol/L (ref 3.5–5.2)
Sodium: 145 mmol/L — ABNORMAL HIGH (ref 134–144)
eGFR: 61 mL/min/{1.73_m2} (ref >59)

## 2023-05-15 ENCOUNTER — Telehealth: Payer: Self-pay | Admitting: Cardiology

## 2023-05-15 MED ORDER — METOPROLOL SUCCINATE ER 50 MG PO TB24: 75.0000 mg | ORAL_TABLET | Freq: Every day | ORAL | Status: DC

## 2023-05-15 NOTE — Telephone Encounter (Signed)
Patient has updated prescription on file, unsure if pharmacy did not receive it.  Re-sent and if still states too soon, he will let us know so we can see if a short supply needs to be called in.

## 2023-05-15 NOTE — Telephone Encounter (Signed)
Pt c/o medication issue:  1. Name of Medication:   metoprolol succinate (TOPROL-XL) 50 MG 24 hr tablet    2. How are you currently taking this medication (dosage and times per day)? Take 1.5 tablets (75 mg total) by mouth at bedtime. Take with or immediately following a meal. - Oral   3. Are you having a reaction (difficulty breathing--STAT)? no  4. What is your medication issue? Pt called in stating Dr. Bjorn Pippin increased this medication from 50 mg to 75mg  and now he is out, however the pharmacy wont fill because it is too early for a refill. He states clinical staff would need to call and request it be filled. Please advise.

## 2023-05-21 ENCOUNTER — Other Ambulatory Visit: Payer: Self-pay

## 2023-05-21 DIAGNOSIS — I10 Essential (primary) hypertension: Secondary | ICD-10-CM

## 2023-05-21 DIAGNOSIS — I5022 Chronic systolic (congestive) heart failure: Secondary | ICD-10-CM

## 2023-05-21 MED ORDER — SACUBITRIL-VALSARTAN 24-26 MG PO TABS: 1.0000 | ORAL_TABLET | Freq: Two times a day (BID) | ORAL | 3 refills | Status: DC

## 2023-05-21 MED ORDER — METOPROLOL SUCCINATE ER 50 MG PO TB24: 75.0000 mg | ORAL_TABLET | Freq: Every day | ORAL | 3 refills | Status: DC

## 2023-06-04 ENCOUNTER — Other Ambulatory Visit: Payer: Self-pay

## 2023-06-04 DIAGNOSIS — I4892 Unspecified atrial flutter: Secondary | ICD-10-CM

## 2023-06-04 DIAGNOSIS — I10 Essential (primary) hypertension: Secondary | ICD-10-CM

## 2023-06-04 DIAGNOSIS — I5022 Chronic systolic (congestive) heart failure: Secondary | ICD-10-CM

## 2023-06-04 DIAGNOSIS — E785 Hyperlipidemia, unspecified: Secondary | ICD-10-CM

## 2023-08-02 ENCOUNTER — Other Ambulatory Visit: Payer: Self-pay | Admitting: Student

## 2023-08-02 DIAGNOSIS — G2581 Restless legs syndrome: Secondary | ICD-10-CM

## 2023-08-07 NOTE — Progress Notes (Signed)
Cardiology Clinic Note   Patient Name: Curtis Henry Date of Encounter: 08/12/2023  Primary Care Provider:  Erick Alley, DO Primary Cardiologist:  Little Ishikawa, MD  Patient Profile    Curtis Henry 67 year old male presents to the clinic today for follow-up evaluation of his chronic systolic CHF.  Past Medical History    Past Medical History:  Diagnosis Date   Anxiety state 03/08/2018   Bursitis of left shoulder    Essential hypertension 01/31/2018   In 2019, has not been on medication since this time.   GERD (gastroesophageal reflux disease)    Hyperlipidemia    Restless leg syndrome    Squamous cell lung cancer [facility])    Past Surgical History:  Procedure Laterality Date   CIRCUMCISION  12/13/2003   COLONOSCOPY     RIGHT/LEFT HEART CATH AND CORONARY ANGIOGRAPHY N/A 01/17/2023   Procedure: RIGHT/LEFT HEART CATH AND CORONARY ANGIOGRAPHY;  Surgeon: Lennette Bihari, MD;  Location: [facility] INVASIVE CV LAB;  Service: Cardiovascular;  Laterality: N/A;   VIDEO ASSISTED THORACOSCOPY (VATS)/ LOBECTOMY Left 01/17/2018   Procedure: left VIDEO ASSISTED THORACOSCOPY (VATS) with left lower lobe wedge resection and LOBECTOMY;  Surgeon: Kerin Perna, MD;  Location: [facility] OR;  Service: Thoracic;  Laterality: Left;    Allergies  Allergies  Allergen Reactions   Lactose Intolerance (Gi) Nausea Only and Other (See Comments)    Unsettled bowel.     History of Present Illness    Curtis Henry has a PMH of chronic systolic CHF, atrial fibrillation/flutter, HLD, HTN, and squamous cell lung cancer.  He was referred for evaluation by his PCP.  He reported chest pain.  He reported 2 different types of chest discomfort.  1 was reported as burning type pain in the center of his chest.  He also reported right sided chest discomfort with increased stress.  He denied exertional chest pain.  5 years ago he had a syncopal episode.  He denied lower extremity swelling.  He quit smoking after 45 years  and 2022.  He also stopped smoking marijuana.  He wore a cardiac event monitor 2/24 which showed 1 episode of NSVT that lasted 5 beats, 5/21 he had an episode of SVT that lasted 1 minute and 37 seconds.  His echocardiogram 01/19/2023 showed an EF of 40-45%, global hypokinesis, intermediate diastolic parameters, and mildly reduced RV function.  During his initial clinic visit cardiac catheterization was ordered for chest pain and systolic heart failure.  His creatinine was 1.85 and his cardiac catheterization was canceled.  His HCTZ and olmesartan were discontinued.  Repeat be met 01/14/2023 showed creatinine down to 1.07.  He underwent right and left heart cath on 5/24 which showed mild nonobstructive CAD.  He was noted to have low normal right heart pressures.  He was last seen by Dr. Bjorn Pippin 03/04/2023.  He was doing well at that time.  He reported lightheadedness and shortness of breath that had improved.  He had an improvement with his palpitations.  He denied chest pain and lower extremity swelling.  He has been following with pharmacy for up titration of his GDMT.  He was last seen by pharmacy on 05/01/2023.  His Marcelline Deist was continued.  His blood pressure was noted to be 137/76.  His heart rate was 60.  He was started on Entresto 24/26.  Plan for adding spironolactone was recommended if renal function continued to be stable.  He presented to the clinic 05/13/23 for follow-up evaluation and  stated he felt he was about 70 to 80%.  We reviewed his echocardiogram and cardiac MRI.  He expressed understanding.  I was unable to uptitrate GDMT due to blood pressure.  I ordered BMP, encouraged heart healthy high-fiber low-sodium diet, and we  planned follow-up in 3 to 4 months.  His BMP remained stable.  Repeat BMP 06/04/2023 showed improved creatinine and normalized sodium level.  He presents to the clinic today for follow-up evaluation and states he continues to work 5 to 6 days/week for about 10 hours/day.  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  He has had about 5 pound weight gain since he was in the clinic last.  He reports compliance with his medications.  His blood pressure continues to be well-controlled.  He does note that medications are expensive and asks for assistance.  I will give him new patient assistance paperwork.  We will repeat his echocardiogram and plan follow-up in 6 months.  Today he denies chest pain, shortness of breath, lower extremity edema, fatigue, palpitations, melena, hematuria, hemoptysis, diaphoresis, weakness, presyncope, syncope, orthopnea, and PND.    Home Medications    Prior to Admission medications   Medication Sig Start Date End Date Taking? Authorizing Provider  apixaban (ELIQUIS) 5 MG TABS tablet Take 1 tablet (5 mg total) by mouth 2 (two) times daily. 11/28/22   Erick Alley, DO  atorvastatin (LIPITOR) 80 MG tablet Take 1 tablet (80 mg total) by mouth daily. 02/19/23   Little Ishikawa, MD  betamethasone valerate ointment (VALISONE) 0.1 % Apply 1 application topically 2 (two) times daily. Patient taking differently: Apply 1 application  topically daily. 10/10/21   Alicia Amel, MD  dapagliflozin propanediol (FARXIGA) 10 MG TABS tablet Take 1 tablet (10 mg total) by mouth daily before breakfast. 05/01/23   O'Neal, Ronnald Ramp, MD  gabapentin (NEURONTIN) 600 MG tablet TAKE 1 TABLET BY MOUTH AT BEDTIME 04/19/23   Erick Alley, DO  hydrALAZINE (APRESOLINE) 25 MG tablet Take 1 tablet (25 mg total) by mouth 3 (three) times daily. 01/02/23 12/28/23  Little Ishikawa, MD  metoprolol succinate (TOPROL-XL) 50 MG 24 hr tablet Take 1.5 tablets (75 mg total) by mouth at bedtime. Take with or immediately following a meal. 01/14/23   Little Ishikawa, MD  Multiple Vitamin (MULTIVITAMIN) tablet Take 1 tablet by mouth daily.    [provider]  Omega-3 Fatty Acids (FISH OIL) 1200 MG CAPS Take 1,200 mg by mouth daily.    [provider]  sacubitril-valsartan (ENTRESTO) 24-26 MG Take 1 tablet by mouth 2 (two) times daily. 05/01/23   Little Ishikawa, MD    Family History    Family History  Problem Relation Age of Onset   Esophageal cancer Brother    Colon cancer Father 81   Arthritis Other    He indicated that his mother is deceased. He indicated that his father is deceased. He indicated that the status of his brother is unknown. He indicated that his maternal grandmother is deceased. He indicated that his maternal grandfather is deceased. He indicated that his paternal grandmother is deceased. He indicated that his paternal grandfather is deceased. He indicated that the status of his other is unknown.  Social History    Social History   Socioeconomic History   Marital status: Widowed    Spouse name: Not on file   Number of children: 5   Years of education: Not on  file   Highest education level: Not on file  Occupational History   Occupation: [employer]  Tobacco Use   Smoking status: Former    Current packs/day: 0.00    Average packs/day: 0.1 packs/day for 46.0 years (4.6 ttl pk-yrs)    Types: Cigarettes    Start date: 09/17/1974    Quit date: 2022    Years since quitting: 2.9   Smokeless tobacco: Never  Vaping Use   Vaping status: Some Days   Substances: Nicotine, Flavoring  Substance and Sexual Activity   Alcohol use: Not Currently   Drug use: Yes    Frequency: 7.0 times per week    Types: Marijuana    Comment: smokes marijuana; crack use distant past >30 yrs ago   Sexual activity: Not Currently  Other Topics Concern   Not on file  Social History Narrative   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    Social Determinants of Health   Financial Resource Strain: Medium Risk (12/25/2022)   Overall Financial Resource Strain (CARDIA)    Difficulty of Paying Living Expenses: Somewhat hard  Food Insecurity: Food Insecurity Present (12/25/2022)   Hunger Vital Sign    Worried About  Running Out of Food in the Last Year: Sometimes true    Ran Out of Food in the Last Year: Sometimes true  Transportation Needs: No Transportation Needs (12/25/2022)   PRAPARE - Administrator, Civil Service (Medical): No    Lack of Transportation (Non-Medical): No  Physical Activity: Inactive (12/25/2022)   Exercise Vital Sign    Days of Exercise per Week: 0 days    Minutes of Exercise per Session: 0 min  Stress: Stress Concern Present (12/25/2022)   [employer] of Occupational Health - Occupational Stress Questionnaire    Feeling of Stress : To some extent  Social Connections: Socially Isolated (12/25/2022)   Social Connection and Isolation Panel [NHANES]    Frequency of Communication with Friends and Family: More than three times a week    Frequency of Social Gatherings with Friends and Family: More than three times a week    Attends Religious Services: Never    Database administrator or Organizations: No    Attends Banker Meetings: Never    Marital Status: Widowed  Intimate Partner Violence: Not At Risk (12/25/2022)   Humiliation, Afraid, Rape, and Kick questionnaire    Fear of Current or Ex-Partner: No    Emotionally Abused: No    Physically Abused: No    Sexually Abused: No     Review of Systems    General:  No chills, fever, night sweats or weight changes.  Cardiovascular:  No chest pain, dyspnea on exertion, edema, orthopnea, palpitations, paroxysmal nocturnal dyspnea. Dermatological: No rash, lesions/masses Respiratory: No cough, dyspnea Urologic: No hematuria, dysuria Abdominal:   No nausea, vomiting, diarrhea, bright red blood per rectum, melena, or hematemesis Neurologic:  No visual changes, wkns, changes in mental status. All other systems reviewed and are otherwise negative except as noted above.  Physical Exam    VS:  BP 118/70 (BP Location: Left Arm, Patient Position: Sitting, Cuff Size: Normal)   Pulse 62   Ht 6' (1.829 m)   Wt 131 lb  (59.4 kg)   SpO2 95%   BMI 17.77 kg/m  , BMI Body mass index is 17.77 kg/m. GEN: Well nourished, well developed, in no acute distress. HEENT: normal. Neck: Supple, no JVD, carotid bruits, or masses. Cardiac: RRR, no murmurs,  rubs, or gallops. No clubbing, cyanosis, edema.  Radials/DP/PT 2+ and equal bilaterally.  Respiratory:  Respirations regular and unlabored, clear to auscultation bilaterally. GI: Soft, nontender, nondistended, BS + x 4. MS: no deformity or atrophy. Skin: warm and dry, no rash. Neuro:  Strength and sensation are intact. Psych: Normal affect.  Accessory Clinical Findings    Recent Labs: 09/14/2022: TSH 0.887 03/13/2023: ALT 19; Hemoglobin 13.1; Magnesium 2.1; Platelets 224 06/04/2023: BUN 12; Creatinine, Ser 1.27; Potassium 4.6; Sodium 141   Recent Lipid Panel    Component Value Date/Time   CHOL 163 02/15/2023 1017   TRIG 61 02/15/2023 1017   HDL 62 02/15/2023 1017   CHOLHDL 2.6 02/15/2023 1017   CHOLHDL 3.9 04/18/2010 0545   VLDL 12 04/18/2010 0545   LDLCALC 89 02/15/2023 1017         ECG personally reviewed by me today-none today.    Echocardiogram 12/21/2022  IMPRESSIONS     1. Left ventricular ejection fraction, by estimation, is 40 to 45%, with  significant variability beat to beat. The left ventricle has mildly  decreased function. The left ventricle demonstrates global hypokinesis.  There is mild left ventricular  hypertrophy. Left ventricular diastolic parameters are indeterminate.   2. Right ventricular systolic function is mildly reduced. The right  ventricular size is normal. Tricuspid regurgitation signal is inadequate  for assessing PA pressure.   3. Borderline bileaflet mitral valve prolapse.. The mitral valve is  grossly normal. Trivial mitral valve regurgitation.   4. The aortic valve is tricuspid. Aortic valve regurgitation is not  visualized.   5. The inferior vena cava is normal in size with greater than 50%  respiratory  variability, suggesting right atrial pressure of 3 mmHg.   FINDINGS   Left Ventricle: Left ventricular ejection fraction, by estimation, is 40  to 45%. The left ventricle has mildly decreased function. The left  ventricle demonstrates global hypokinesis. The left ventricular internal  cavity size was normal in size. There is   mild left ventricular hypertrophy. Left ventricular diastolic parameters  are indeterminate.   Right Ventricle: The right ventricular size is normal. No increase in  right ventricular wall thickness. Right ventricular systolic function is  mildly reduced. Tricuspid regurgitation signal is inadequate for assessing  PA pressure. The tricuspid  regurgitant velocity is 1.85 m/s, and with an assumed right atrial  pressure of 3 mmHg, the estimated right ventricular systolic pressure is  16.7 mmHg.   Left Atrium: Left atrial size was normal in size.   Right Atrium: Right atrial size was normal in size.   Pericardium: There is no evidence of pericardial effusion.   Mitral Valve: Borderline bileaflet mitral valve prolapse. The mitral valve  is grossly normal. Trivial mitral valve regurgitation.   Tricuspid Valve: The tricuspid valve is normal in structure. Tricuspid  valve regurgitation is trivial. No evidence of tricuspid stenosis.   Aortic Valve: The aortic valve is tricuspid. Aortic valve regurgitation is  not visualized. Aortic valve mean gradient measures 2.0 mmHg. Aortic valve  peak gradient measures 4.6 mmHg. Aortic valve area, by VTI measures 2.75  cm.   Pulmonic Valve: The pulmonic valve was grossly normal. Pulmonic valve  regurgitation is trivial.   Aorta: The aortic root is normal in size and structure.   Venous: The inferior vena cava is normal in size with greater than 50%  respiratory variability, suggesting right atrial pressure of 3 mmHg.   IAS/Shunts: The interatrial septum was not well visualized.  Assessment & Plan   1.   Chronic systolic CHF-tolerating Entresto well.  Blood pressure today 118/70.  Continues to be somewhat physically active.  Activity stable.  Euvolemic today.  Weight stable at, 131 pounds.  Echocardiogram 4/24 showed an EF of 40-45%. Continue Sherryll Burger, Farxiga, metoprolol Unable to uptitrate GDMT at this time due to blood pressure. Repeat echocardiogram  Atrial fibrillation/flutter-heart rate today 62.  Reports compliance with apixaban.  Denies bleeding issues.  CHA2DS2-VASc score 3. Continue metoprolol, apixaban Avoid triggers caffeine, chocolate, EtOH, dehydration etc.  Essential hypertension-BP today 118/70. Continue to maintain blood pressure log Heart healthy low-sodium diet-reviewed Maintain physical activity   Hyperlipidemia-LDL 89 on 02/15/23.  Goal will less than 70 High-fiber diet-reviewed importance of limiting high cholesterol foods Increase physical activity as tolerated Continue atorvastatin, omega-3 fatty acids   Disposition: Follow-up with Dr. Bjorn Pippin or me in 6 months.   Thomasene Ripple. Emmanuel Ercole NP-C     08/12/2023, 9:28 AM [facility] [address] Office [phone] Fax [phone]    I spent 14 minutes examining this patient, reviewing medications, and using patient centered shared decision making involving her cardiac care.  Prior to her visit I spent greater than 20 minutes reviewing her past medical history,  medications, and prior cardiac tests.

## 2023-08-12 ENCOUNTER — Encounter: Payer: Self-pay | Admitting: General Practice

## 2023-08-12 ENCOUNTER — Ambulatory Visit: Payer: PPO | Attending: General Practice | Admitting: General Practice

## 2023-08-12 VITALS — BP 118/70 | HR 62 | Ht 72.0 in | Wt 131.0 lb

## 2023-08-12 DIAGNOSIS — E785 Hyperlipidemia, unspecified: Secondary | ICD-10-CM

## 2023-08-12 DIAGNOSIS — I10 Essential (primary) hypertension: Secondary | ICD-10-CM | POA: Diagnosis not present

## 2023-08-12 DIAGNOSIS — I5022 Chronic systolic (congestive) heart failure: Secondary | ICD-10-CM

## 2023-08-12 DIAGNOSIS — I4892 Unspecified atrial flutter: Secondary | ICD-10-CM | POA: Diagnosis not present

## 2023-08-12 NOTE — Patient Instructions (Signed)
Medication Instructions:  The current medical regimen is effective;  continue present plan and medications as directed. Please refer to the Current Medication list given to you today. *If you need a refill on your cardiac medications before your next appointment, please call your pharmacy*  Lab Work: NONE  Testing/Procedures: Your physician has requested that you have an echocardiogram. Echocardiography is a painless test that uses sound waves to create images of your heart. It provides your doctor with information about the size and shape of your heart and how well your heart's chambers and valves are working. This procedure takes approximately one hour. There are no restrictions for this procedure. Please do NOT wear cologne, perfume, aftershave, or lotions (deodorant is allowed). Please arrive 15 minutes prior to your appointment time.  Please note: We ask at that you not bring children with you during ultrasound (echo/ vascular) testing. Due to room size and safety concerns, children are not allowed in the ultrasound rooms during exams. Our front office staff cannot provide observation of children in our lobby area while testing is being conducted. An adult accompanying a patient to their appointment will only be allowed in the ultrasound room at the discretion of the ultrasound technician under special circumstances. We apologize for any inconvenience.   Follow-Up: At [facility], you and your health needs are our priority.  As part of our continuing mission to provide you with exceptional heart care, we have created designated Provider Care Teams.  These Care Teams include your primary Cardiologist (physician) and Advanced Practice Providers (APPs -  Physician Assistants and Nurse Practitioners) who all work together to provide you with the care you need, when you need it.  Your next appointment:   6 month(s)  Provider:   Little Ishikawa, MD    Other Instructions

## 2023-08-12 NOTE — Addendum Note (Signed)
Addended by: Alyson Ingles on: 08/12/2023 09:34 AM   Modules accepted: Orders

## 2023-08-23 ENCOUNTER — Ambulatory Visit (INDEPENDENT_AMBULATORY_CARE_PROVIDER_SITE_OTHER): Payer: PPO | Admitting: Student

## 2023-08-23 ENCOUNTER — Encounter: Payer: Self-pay | Admitting: Student

## 2023-08-23 VITALS — BP 138/94 | HR 74 | Ht 72.0 in | Wt 129.2 lb

## 2023-08-23 DIAGNOSIS — Z23 Encounter for immunization: Secondary | ICD-10-CM | POA: Diagnosis not present

## 2023-08-23 DIAGNOSIS — M5432 Sciatica, left side: Secondary | ICD-10-CM

## 2023-08-23 NOTE — Progress Notes (Unsigned)
    SUBJECTIVE:   CHIEF COMPLAINT / HPI:   Pain in L leg Started a month or two ago - shooting pain down leg into the foot. Denies low back pain.  Pain is worse with walking and getting in and out of truck.  No fevers, no swelling of extremities.  Has been taking gabapentin 600 mg only at bedtime.  Previously tried gabapentin during the day but even 100 mg made him too sleepy.  Has also tried Tylenol but only once a day on some occasions.   PERTINENT  PMH / PSH: A-fib on Eliquis, heart failure, nonobstructive CAD  OBJECTIVE:   BP (!) 138/94   Pulse 74   Ht 6' (1.829 m)   Wt 129 lb 3.2 oz (58.6 kg)   SpO2 100%   BMI 17.52 kg/m    General: NAD, pleasant, able to participate in exam Cardiac: RRR Respiratory: Breathing comfortably on room air Extremities: no edema or deformity of BLEs with 5/5 muscle strength in flexion, extension, abduction and adduction.  Straight leg test negative bilaterally.  No pain with palpation of lower back musculature or spine Skin: warm and dry Neuro: alert, no obvious focal deficits Psych: Normal affect and mood  ASSESSMENT/PLAN:   Sciatica of left side Shooting pain down left leg into foot is most consistent with sciatica.  Low concern for cauda equina d/t chronicity of symptoms, no back pain, no weakness, complains of no red flag symptoms. Cannot have NSAIDs d/t cardiac hx.  -Continue gabapentin 600 mg at bedtime -Increase Tylenol to 1000 mg every 6 hours as needed -Provided handout and discussed twice daily stretching -Return in~2 weeks for follow-up, can consider PT or sports med referral    Flu vaccine given today, states he received pneumonia and Shingrix vaccines elsewhere  Dr. Erick Alley, DO [facility]

## 2023-08-23 NOTE — Patient Instructions (Addendum)
It was great to see you! Thank you for allowing me to participate in your care!  I recommend that you always bring your medications to each appointment as this makes it easy to ensure you are on the correct medications and helps [place] not miss when refills are needed.  Our plans for today:  - For pain, you can take tylenol 1000 mg every 6 hours as needed - pick a few of the below to do twice daily     Return in 2-4 weeks for follow up  Take care and seek immediate care sooner if you develop any concerns.   Dr. Erick Alley, DO [facility]

## 2023-08-24 DIAGNOSIS — M5432 Sciatica, left side: Secondary | ICD-10-CM | POA: Insufficient documentation

## 2023-08-24 NOTE — Assessment & Plan Note (Signed)
Shooting pain down left leg into foot is most consistent with sciatica.  Low concern for cauda equina d/t chronicity of symptoms, no back pain, no weakness, complains of no red flag symptoms. Cannot have NSAIDs d/t cardiac hx.  -Continue gabapentin 600 mg at bedtime -Increase Tylenol to 1000 mg every 6 hours as needed -Provided handout and discussed twice daily stretching -Return in~2 weeks for follow-up, can consider PT or sports med referral

## 2023-09-17 ENCOUNTER — Ambulatory Visit (HOSPITAL_COMMUNITY): Payer: PPO | Attending: General Practice

## 2023-09-24 ENCOUNTER — Ambulatory Visit (HOSPITAL_COMMUNITY): Payer: PPO | Attending: Cardiology

## 2023-09-24 DIAGNOSIS — I5022 Chronic systolic (congestive) heart failure: Secondary | ICD-10-CM | POA: Diagnosis not present

## 2023-09-24 LAB — ECHOCARDIOGRAM COMPLETE
Area-P 1/2: 2.79 cm2
S' Lateral: 2.65 cm

## 2023-10-10 ENCOUNTER — Other Ambulatory Visit: Payer: Self-pay | Admitting: Student

## 2023-10-10 DIAGNOSIS — E785 Hyperlipidemia, unspecified: Secondary | ICD-10-CM

## 2023-10-27 ENCOUNTER — Other Ambulatory Visit: Payer: Self-pay | Admitting: Student

## 2023-10-27 DIAGNOSIS — E785 Hyperlipidemia, unspecified: Secondary | ICD-10-CM

## 2023-10-30 ENCOUNTER — Other Ambulatory Visit: Payer: Self-pay | Admitting: Student

## 2023-10-30 DIAGNOSIS — G2581 Restless legs syndrome: Secondary | ICD-10-CM

## 2023-11-30 ENCOUNTER — Other Ambulatory Visit: Payer: Self-pay | Admitting: Student

## 2023-11-30 DIAGNOSIS — I4892 Unspecified atrial flutter: Secondary | ICD-10-CM

## 2023-12-22 ENCOUNTER — Other Ambulatory Visit: Payer: Self-pay | Admitting: Cardiology

## 2023-12-26 ENCOUNTER — Telehealth: Payer: Self-pay | Admitting: Cardiology

## 2023-12-26 DIAGNOSIS — E785 Hyperlipidemia, unspecified: Secondary | ICD-10-CM

## 2023-12-26 MED ORDER — ATORVASTATIN CALCIUM 80 MG PO TABS: 80.0000 mg | ORAL_TABLET | Freq: Every day | ORAL | 2 refills | Status: DC

## 2023-12-26 NOTE — Telephone Encounter (Signed)
 Pt's medication was sent to pt's pharmacy as requested. Confirmation received.

## 2023-12-26 NOTE — Telephone Encounter (Signed)
*  STAT* If patient is at the pharmacy, call can be transferred to refill team.   1. Which medications need to be refilled? (please list name of each medication and dose if known)         atorvastatin (LIPITOR) 80 MG tablet    2. Which pharmacy/location (including street and city if local pharmacy) is medication to be sent to? [id number] - [place] ([place]), [place] - [address]   3. Do they need a 30 day or 90 day supply?   Patient is completely out of medication. He says he took his last tablet today and will need enough medication to last until 5/14 appointment with Dr. Bjorn Pippin.

## 2024-01-09 ENCOUNTER — Other Ambulatory Visit: Payer: Self-pay | Admitting: Student

## 2024-01-09 DIAGNOSIS — I4892 Unspecified atrial flutter: Secondary | ICD-10-CM

## 2024-01-21 ENCOUNTER — Other Ambulatory Visit: Payer: Self-pay | Admitting: Cardiology

## 2024-01-25 ENCOUNTER — Other Ambulatory Visit: Payer: Self-pay | Admitting: Student

## 2024-01-25 DIAGNOSIS — G2581 Restless legs syndrome: Secondary | ICD-10-CM

## 2024-01-28 NOTE — Progress Notes (Unsigned)
 Cardiology Office Note:    Date:  01/29/2024   ID:  TILER VAZIRI, DOB December 26, 1955, MRN [id number]  PCP:  Glenn Lange, DO  Cardiologist:  Wendie Hamburg, MD  Electrophysiologist:  None   Referring MD: Glenn Lange, DO   Chief Complaint  Patient presents with   Congestive Heart Failure    History of Present Illness:    Curtis Henry is a 68 y.o. male with a hx of chronic systolic heart failure, atrial fibrillation/flutter, hypertension, hyperlipidemia, squamous cell lung cancer who presents for follow-up.  He was referred by Dr. Rochelle Chu for evaluation of chest pain, initially seen 01/01/2023.  Zio patch x 14 days 10/2022 showed 1 episode of NSVT lasting 5 beats, 01/22/2020 episodes of SVT with longest lasting 1 minute 37 seconds with average rate 173 bpm, atrial flutter with 2% burden with average rate 140 bpm, longest episode lasting 35 minutes 52 seconds with average rate 149 bpm, frequent PACs (7.7% of beats).  Echocardiogram 12/21/2022 showed EF 40 to 45%, global hypokinesis, indeterminate diastolic function, mildly reduced RV function.  At initial clinic visit, cardiac cath was ordered for workup of chest pain and systolic heart failure.  However his creatinine was 1.85 on 01/01/2023 (increased from 1.07 on 11/28/2022), so catheterization was canceled.  HCTZ and olmesartan  were discontinued.  Recheck BMET on 01/14/2023 showed creatinine down to 1.07.  Underwent RHC/LHC on 01/17/2023, which showed mild nonobstructive CAD (20% ostial left main stenosis, 30% ramus), low normal right heart pressures (RA 1, RV 22/1, PA 23/6/16, PCWP 5, CI 2.2).  Cardiac MRI 05/12/2023 showed LVEF 55%, RVEF 51%, no LGE.  Echocardiogram 09/2023 showed EF 45 to 50%, normal RV function, mild to moderate mitral regurgitation.  Since last clinic visit, he reports he is doing well.  States that his lightheadedness and palpitations have resolved.  Does report gets rare sharp chest pain and also reports occasional dyspnea.   Denies any lower extremity edema.  He is taking Eliquis , denies any bleeding issues.  Wt Readings from Last 3 Encounters:  01/29/24 132 lb 14.4 oz (60.3 kg)  08/23/23 129 lb 3.2 oz (58.6 kg)  08/12/23 131 lb (59.4 kg)     Past Medical History:  Diagnosis Date   Anxiety state 03/08/2018   Bursitis of left shoulder    Essential hypertension 01/31/2018   In 2019, has not been on medication since this time.   GERD (gastroesophageal reflux disease)    Hyperlipidemia    Restless leg syndrome    Squamous cell lung cancer [facility])     Past Surgical History:  Procedure Laterality Date   CIRCUMCISION  12/13/2003   COLONOSCOPY     RIGHT/LEFT HEART CATH AND CORONARY ANGIOGRAPHY N/A 01/17/2023   Procedure: RIGHT/LEFT HEART CATH AND CORONARY ANGIOGRAPHY;  Surgeon: Millicent Ally, MD;  Location: [facility];  Service: Cardiovascular;  Laterality: N/A;   VIDEO ASSISTED THORACOSCOPY (VATS)/ LOBECTOMY Left 01/17/2018   Procedure: left VIDEO ASSISTED THORACOSCOPY (VATS) with left lower lobe wedge resection and LOBECTOMY;  Surgeon: Heriberto London, MD;  Location: [facility] OR;  Service: Thoracic;  Laterality: Left;    Current Medications: Current Meds  Medication Sig   apixaban  (ELIQUIS ) 5 MG TABS tablet Take 1 tablet by mouth twice daily   atorvastatin  (LIPITOR) 80 MG tablet Take 1 tablet (80 mg total) by mouth daily.   betamethasone  valerate ointment (VALISONE ) 0.1 % Apply 1 application topically 2 (two) times daily. (Patient taking differently: Apply 1 application  topically  daily.)   dapagliflozin  propanediol (FARXIGA ) 10 MG TABS tablet Take 1 tablet (10 mg total) by mouth daily before breakfast.   gabapentin  (NEURONTIN ) 600 MG tablet TAKE 1 TABLET BY MOUTH AT BEDTIME   metoprolol  succinate (TOPROL -XL) 50 MG 24 hr tablet Take 1.5 tablets (75 mg total) by mouth at bedtime. Take with or immediately following a meal.   Multiple Vitamin (MULTIVITAMIN) tablet Take 1 tablet by mouth daily.   Omega-3 Fatty  Acids (FISH OIL) 1200 MG CAPS Take 1,200 mg by mouth daily.   sacubitril -valsartan  (ENTRESTO ) 24-26 MG Take 1 tablet by mouth 2 (two) times daily.     Allergies:   Lactose intolerance (gi)   Social History   Socioeconomic History   Marital status: Widowed    Spouse name: Not on file   Number of children: 5   Years of education: Not on file   Highest education level: Not on file  Occupational History   Occupation: [employer]  Tobacco Use   Smoking status: Former    Current packs/day: 0.00    Average packs/day: 0.1 packs/day for 46.0 years (4.6 ttl pk-yrs)    Types: Cigarettes    Start date: 09/17/1974    Quit date: 2022    Years since quitting: 3.3   Smokeless tobacco: Never  Vaping Use   Vaping status: Some Days   Substances: Nicotine , Flavoring  Substance and Sexual Activity   Alcohol use: Not Currently   Drug use: Yes    Frequency: 7.0 times per week    Types: Marijuana    Comment: smokes marijuana; crack use distant past >30 yrs ago   Sexual activity: Not Currently  Other Topics Concern   Not on file  Social History Narrative   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    Social Drivers of Health   Financial Resource Strain: Medium Risk (12/25/2022)   Overall Financial Resource Strain (CARDIA)    Difficulty of Paying Living Expenses: Somewhat hard  Food Insecurity: Food Insecurity Present (12/25/2022)   Hunger Vital Sign    Worried About Running Out of Food in the Last Year: Sometimes true    Ran Out of Food in the Last Year: Sometimes true  Transportation Needs: No Transportation Needs (12/25/2022)   PRAPARE - Administrator, Civil Service (Medical): No    Lack of Transportation (Non-Medical): No  Physical Activity: Inactive (12/25/2022)   Exercise Vital Sign    Days of Exercise per Week: 0 days    Minutes of Exercise per Session: 0 min  Stress: Stress Concern Present (12/25/2022)   [employer] of Occupational Health - Occupational Stress  Questionnaire    Feeling of Stress : To some extent  Social Connections: Socially Isolated (12/25/2022)   Social Connection and Isolation Panel [NHANES]    Frequency of Communication with Friends and Family: More than three times a week    Frequency of Social Gatherings with Friends and Family: More than three times a week    Attends Religious Services: Never    Database administrator or Organizations: No    Attends Banker Meetings: Never    Marital Status: Widowed     Family History: The patient's family history includes Arthritis in an other family member; Colon cancer (age of onset: 18) in his father; Esophageal cancer in his brother.  ROS:   Please see the history of present illness.     All other systems reviewed and are negative.  EKGs/Labs/Other Studies  Reviewed:    The following studies were reviewed today:   EKG:   01/01/2023: Normal sinus rhythm, rate 65, no ST abnormalities 02/15/2023: Normal sinus rhythm, rate 61, no ST abnormalities 01/29/2024: Sinus bradycardia, rate 53, no ST abnormalities  Recent Labs: 03/13/2023: ALT 19; Hemoglobin 13.1; Magnesium 2.1; Platelets 224 06/04/2023: BUN 12; Creatinine, Ser 1.27; Potassium 4.6; Sodium 141  Recent Lipid Panel    Component Value Date/Time   CHOL 163 02/15/2023 1017   TRIG 61 02/15/2023 1017   HDL 62 02/15/2023 1017   CHOLHDL 2.6 02/15/2023 1017   CHOLHDL 3.9 04/18/2010 0545   VLDL 12 04/18/2010 0545   LDLCALC 89 02/15/2023 1017    Physical Exam:    VS:  BP 138/80   Pulse 83   Ht 6' (1.829 m)   Wt 132 lb 14.4 oz (60.3 kg)   SpO2 100%   BMI 18.02 kg/m     Wt Readings from Last 3 Encounters:  01/29/24 132 lb 14.4 oz (60.3 kg)  08/23/23 129 lb 3.2 oz (58.6 kg)  08/12/23 131 lb (59.4 kg)     GEN:  Well nourished, well developed in no acute distress HEENT: Normal NECK: No JVD; No carotid bruits LYMPHATICS: No lymphadenopathy CARDIAC: RRR, no murmurs, rubs, gallops RESPIRATORY:  Clear to  auscultation without rales, wheezing or rhonchi  ABDOMEN: Soft, non-tender, non-distended MUSCULOSKELETAL:  No edema; No deformity  SKIN: Warm and dry NEUROLOGIC:  Alert and oriented x 3 PSYCHIATRIC:  Normal affect   ASSESSMENT:    1. Chronic systolic heart failure (HCC)   2. Coronary artery disease involving native coronary artery of native heart without angina pectoris   3. Atrial flutter, unspecified type (HCC)   4. Essential hypertension   5. SVT (supraventricular tachycardia) (HCC)     PLAN:    Chronic systolic heart failure:Echocardiogram 12/21/2022 showed EF 40 to 45%, global hypokinesis, indeterminate diastolic function, mildly reduced RV function.  Reported atypical chest pain.  Underwent RHC/LHC on 01/17/2023, which showed mild nonobstructive CAD (20% ostial left main stenosis, 30% ramus), low normal right heart pressures (RA 1, RV 22/1, PA 23/6/16, PCWP 5, CI 2.2).  Cardiac MRI 05/12/2023 showed LVEF 55%, RVEF 51%, no LGE.  Echocardiogram 09/2023 showed EF 45 to 50%, normal RV function, mild to moderate mitral regurgitation. -Continue Toprol -XL 75 mg daily -Continue Entresto  24-26 mg twice daily.  Check BMET -Continue Farxiga  10 mg daily  CAD: mild nonobstructive CAD (20% ostial left main stenosis, 30% ramus) on cath 01/17/2023 -Continue Eliquis  -On atorvastatin  80 mg daily  AKI: creatinine was 1.85 on 01/01/2023 (increased from 1.07 on 11/28/2022).  HCTZ and olmesartan  were discontinued.  Recheck BMET on 01/11/2023 showed improvement in creatinine to 1.07.  Check BMET  Atrial flutter: Zio patch x 14 days 10/2022 showed 1 episode of NSVT lasting 5 beats,5221 episodes of SVT with longest lasting 1 minute 37 seconds with average rate 173 bpm, atrial flutter with 2% burden with average rate 140 bpm, longest episode lasting 35 minutes 52 seconds with average rate 149 bpm, frequent PACs (7.7% of beats).   -CHA2DS2-VASc 3 (CHF, hypertension, age).  Continue Eliquis  5 mg twice daily -Continue  Toprol -XL 75 mg daily  SVT/frequent PACs: ZIO monitor results 10/2022 as above.  Continue Toprol -XL 75 mg daily - Repeat Zio patch x 7 days to monitor for improvement  Hypertension: On Entresto , Toprol -XL.  Appears controlled  Hyperlipidemia: On atorvastatin  40 mg daily, LDL 89 on 02/15/2023.  Atorvastatin  increased to 80 mg daily for  goal LDL less than 70 given CAD as above.  Check lipid panel   RTC in 4 months  Medication Adjustments/Labs and Tests Ordered: Current medicines are reviewed at length with the patient today.  Concerns regarding medicines are outlined above.  Orders Placed This Encounter  Procedures   CBC   Comprehensive metabolic panel with GFR   Lipid panel   Magnesium   LONG TERM MONITOR (3-14 DAYS)   EKG 12-Lead   No orders of the defined types were placed in this encounter.   Patient Instructions  Medication Instructions:  Your physician recommends that you continue on your current medications as directed. Please refer to the Current Medication list given to you today.    Lab Work: Your physician recommends that you return for lab work: CBC, CMP, MAG, and LIPIDS   Testing/Procedures: Your physician has recommended that you wear a Zio monitor.   This monitor is a medical device that records the heart's electrical activity. Doctors most often use these monitors to diagnose arrhythmias. Arrhythmias are problems with the speed or rhythm of the heartbeat. The monitor is a small device applied to your chest. You can wear one while you do your normal daily activities. While wearing this monitor if you have any symptoms to push the button and record what you felt. Once you have worn this monitor for the period of time provider prescribed (Usually 14 days), you will return the monitor device in the postage paid box. Once it is returned they will download the data collected and provide us  with a report which the provider will then review and we will call you with those  results. Important tips:  Avoid showering during the first 24 hours of wearing the monitor. Avoid excessive sweating to help maximize wear time. Do not submerge the device, no hot tubs, and no swimming pools. Keep any lotions or oils away from the patch. After 24 hours you may shower with the patch on. Take brief showers with your back facing the shower head.  Do not remove patch once it has been placed because that will interrupt data and decrease adhesive wear time. Push the button when you have any symptoms and write down what you were feeling. Once you have completed wearing your monitor, remove and place into box which has postage paid and place in your outgoing mailbox.  If for some reason you have misplaced your box then call our office and we can provide another box and/or mail it off for you.   Follow-Up: At [facility], you and your health needs are our priority.  As part of our continuing mission to provide you with exceptional heart care, our providers are all part of one team.  This team includes your primary Cardiologist (physician) and Advanced Practice Providers or APPs (Physician Assistants and Nurse Practitioners) who all work together to provide you with the care you need, when you need it.  Your next appointment:   4 month(s)  Provider:   Wendie Hamburg, MD     Signed, Wendie Hamburg, MD  01/29/2024 12:08 PM    [facility]

## 2024-01-29 ENCOUNTER — Ambulatory Visit (INDEPENDENT_AMBULATORY_CARE_PROVIDER_SITE_OTHER): Admitting: Cardiology

## 2024-01-29 ENCOUNTER — Other Ambulatory Visit (HOSPITAL_BASED_OUTPATIENT_CLINIC_OR_DEPARTMENT_OTHER)

## 2024-01-29 ENCOUNTER — Encounter (HOSPITAL_BASED_OUTPATIENT_CLINIC_OR_DEPARTMENT_OTHER): Payer: Self-pay | Admitting: Cardiology

## 2024-01-29 VITALS — BP 138/80 | HR 83 | Ht 72.0 in | Wt 132.9 lb

## 2024-01-29 DIAGNOSIS — I2510 Atherosclerotic heart disease of native coronary artery without angina pectoris: Secondary | ICD-10-CM | POA: Diagnosis not present

## 2024-01-29 DIAGNOSIS — I471 Supraventricular tachycardia, unspecified: Secondary | ICD-10-CM | POA: Diagnosis not present

## 2024-01-29 DIAGNOSIS — I5022 Chronic systolic (congestive) heart failure: Secondary | ICD-10-CM

## 2024-01-29 DIAGNOSIS — I10 Essential (primary) hypertension: Secondary | ICD-10-CM | POA: Diagnosis not present

## 2024-01-29 DIAGNOSIS — I4892 Unspecified atrial flutter: Secondary | ICD-10-CM

## 2024-01-29 NOTE — Patient Instructions (Addendum)
 Medication Instructions:  Your physician recommends that you continue on your current medications as directed. Please refer to the Current Medication list given to you today.    Lab Work: Your physician recommends that you return for lab work: CBC, CMP, MAG, and LIPIDS   Testing/Procedures: Your physician has recommended that you wear a Zio monitor.   This monitor is a medical device that records the heart's electrical activity. Doctors most often use these monitors to diagnose arrhythmias. Arrhythmias are problems with the speed or rhythm of the heartbeat. The monitor is a small device applied to your chest. You can wear one while you do your normal daily activities. While wearing this monitor if you have any symptoms to push the button and record what you felt. Once you have worn this monitor for the period of time provider prescribed (Usually 14 days), you will return the monitor device in the postage paid box. Once it is returned they will download the data collected and provide us  with a report which the provider will then review and we will call you with those results. Important tips:  Avoid showering during the first 24 hours of wearing the monitor. Avoid excessive sweating to help maximize wear time. Do not submerge the device, no hot tubs, and no swimming pools. Keep any lotions or oils away from the patch. After 24 hours you may shower with the patch on. Take brief showers with your back facing the shower head.  Do not remove patch once it has been placed because that will interrupt data and decrease adhesive wear time. Push the button when you have any symptoms and write down what you were feeling. Once you have completed wearing your monitor, remove and place into box which has postage paid and place in your outgoing mailbox.  If for some reason you have misplaced your box then call our office and we can provide another box and/or mail it off for you.   Follow-Up: At [facility], you and your health needs are our priority.  As part of our continuing mission to provide you with exceptional heart care, our providers are all part of one team.  This team includes your primary Cardiologist (physician) and Advanced Practice Providers or APPs (Physician Assistants and Nurse Practitioners) who all work together to provide you with the care you need, when you need it.  Your next appointment:   4 month(s)  Provider:   Wendie Hamburg, MD

## 2024-01-30 ENCOUNTER — Ambulatory Visit: Payer: Self-pay | Admitting: Cardiology

## 2024-01-30 LAB — CBC
Hematocrit: 39.4 % (ref 37.5–51.0)
Hemoglobin: 12.8 g/dL — ABNORMAL LOW (ref 13.0–17.7)
MCH: 30 pg (ref 26.6–33.0)
MCHC: 32.5 g/dL (ref 31.5–35.7)
MCV: 93 fL (ref 79–97)
Platelets: 315 10*3/uL (ref 150–450)
RBC: 4.26 x10E6/uL (ref 4.14–5.80)
RDW: 12.5 % (ref 11.6–15.4)
WBC: 6 10*3/uL (ref 3.4–10.8)

## 2024-01-30 LAB — COMPREHENSIVE METABOLIC PANEL WITH GFR
ALT: 22 IU/L (ref 0–44)
AST: 22 IU/L (ref 0–40)
Albumin: 4.9 g/dL (ref 3.9–4.9)
Alkaline Phosphatase: 42 IU/L — ABNORMAL LOW (ref 44–121)
BUN/Creatinine Ratio: 10 (ref 10–24)
BUN: 8 mg/dL (ref 8–27)
Bilirubin Total: 0.4 mg/dL (ref 0.0–1.2)
CO2: 21 mmol/L (ref 20–29)
Calcium: 10.1 mg/dL (ref 8.6–10.2)
Chloride: 100 mmol/L (ref 96–106)
Creatinine, Ser: 0.77 mg/dL (ref 0.76–1.27)
Globulin, Total: 2.6 g/dL (ref 1.5–4.5)
Glucose: 87 mg/dL (ref 70–99)
Potassium: 3.7 mmol/L (ref 3.5–5.2)
Sodium: 139 mmol/L (ref 134–144)
Total Protein: 7.5 g/dL (ref 6.0–8.5)
eGFR: 98 mL/min/{1.73_m2} (ref >59)

## 2024-01-30 LAB — LIPID PANEL
Chol/HDL Ratio: 3.7 ratio (ref 0.0–5.0)
Cholesterol, Total: 204 mg/dL — ABNORMAL HIGH (ref 100–199)
HDL: 55 mg/dL (ref >39)
LDL Chol Calc (NIH): 131 mg/dL — ABNORMAL HIGH (ref 0–99)
Triglycerides: 102 mg/dL (ref 0–149)
VLDL Cholesterol Cal: 18 mg/dL (ref 5–40)

## 2024-01-30 LAB — MAGNESIUM: Magnesium: 2.1 mg/dL (ref 1.6–2.3)

## 2024-02-13 DIAGNOSIS — I471 Supraventricular tachycardia, unspecified: Secondary | ICD-10-CM | POA: Diagnosis not present

## 2024-02-25 ENCOUNTER — Encounter: Payer: Self-pay | Admitting: *Deleted

## 2024-03-02 DIAGNOSIS — I471 Supraventricular tachycardia, unspecified: Secondary | ICD-10-CM | POA: Diagnosis not present

## 2024-03-27 DIAGNOSIS — H52223 Regular astigmatism, bilateral: Secondary | ICD-10-CM | POA: Diagnosis not present

## 2024-03-27 DIAGNOSIS — H5203 Hypermetropia, bilateral: Secondary | ICD-10-CM | POA: Diagnosis not present

## 2024-03-27 DIAGNOSIS — H40023 Open angle with borderline findings, high risk, bilateral: Secondary | ICD-10-CM | POA: Diagnosis not present

## 2024-03-27 DIAGNOSIS — H2513 Age-related nuclear cataract, bilateral: Secondary | ICD-10-CM | POA: Diagnosis not present

## 2024-03-27 DIAGNOSIS — H35371 Puckering of macula, right eye: Secondary | ICD-10-CM | POA: Diagnosis not present

## 2024-03-27 DIAGNOSIS — H524 Presbyopia: Secondary | ICD-10-CM | POA: Diagnosis not present

## 2024-03-27 DIAGNOSIS — H31092 Other chorioretinal scars, left eye: Secondary | ICD-10-CM | POA: Diagnosis not present

## 2024-03-27 DIAGNOSIS — H43813 Vitreous degeneration, bilateral: Secondary | ICD-10-CM | POA: Diagnosis not present

## 2024-04-06 ENCOUNTER — Other Ambulatory Visit: Payer: Self-pay | Admitting: *Deleted

## 2024-04-06 DIAGNOSIS — I4892 Unspecified atrial flutter: Secondary | ICD-10-CM

## 2024-04-07 MED ORDER — APIXABAN 5 MG PO TABS: 5.0000 mg | ORAL_TABLET | Freq: Two times a day (BID) | ORAL | 0 refills | Status: DC

## 2024-04-23 ENCOUNTER — Other Ambulatory Visit: Payer: Self-pay | Admitting: Cardiovascular Disease

## 2024-04-23 ENCOUNTER — Other Ambulatory Visit: Payer: Self-pay | Admitting: *Deleted

## 2024-04-23 DIAGNOSIS — G2581 Restless legs syndrome: Secondary | ICD-10-CM

## 2024-04-23 MED ORDER — GABAPENTIN 600 MG PO TABS: 600.0000 mg | ORAL_TABLET | Freq: Every day | ORAL | 0 refills | Status: DC

## 2024-05-04 ENCOUNTER — Other Ambulatory Visit: Payer: Self-pay

## 2024-05-04 ENCOUNTER — Encounter (HOSPITAL_COMMUNITY): Payer: Self-pay

## 2024-05-04 ENCOUNTER — Ambulatory Visit (INDEPENDENT_AMBULATORY_CARE_PROVIDER_SITE_OTHER): Admitting: Family Medicine

## 2024-05-04 ENCOUNTER — Emergency Department (HOSPITAL_COMMUNITY)

## 2024-05-04 ENCOUNTER — Encounter: Payer: Self-pay | Admitting: Family Medicine

## 2024-05-04 ENCOUNTER — Emergency Department (HOSPITAL_COMMUNITY)
Admission: EM | Admit: 2024-05-04 | Discharge: 2024-05-04 | Disposition: A | Discharge status: Home / Self Care | Attending: Emergency Medicine | Admitting: Emergency Medicine

## 2024-05-04 VITALS — BP 178/87 | HR 52 | Ht 72.0 in | Wt 129.8 lb

## 2024-05-04 DIAGNOSIS — R42 Dizziness and giddiness: Secondary | ICD-10-CM | POA: Insufficient documentation

## 2024-05-04 DIAGNOSIS — I110 Hypertensive heart disease with heart failure: Secondary | ICD-10-CM | POA: Diagnosis not present

## 2024-05-04 DIAGNOSIS — R0789 Other chest pain: Secondary | ICD-10-CM | POA: Insufficient documentation

## 2024-05-04 DIAGNOSIS — R079 Chest pain, unspecified: Secondary | ICD-10-CM

## 2024-05-04 DIAGNOSIS — I2510 Atherosclerotic heart disease of native coronary artery without angina pectoris: Secondary | ICD-10-CM | POA: Diagnosis not present

## 2024-05-04 DIAGNOSIS — I5020 Unspecified systolic (congestive) heart failure: Secondary | ICD-10-CM | POA: Diagnosis not present

## 2024-05-04 DIAGNOSIS — Z7901 Long term (current) use of anticoagulants: Secondary | ICD-10-CM | POA: Insufficient documentation

## 2024-05-04 DIAGNOSIS — R7989 Other specified abnormal findings of blood chemistry: Secondary | ICD-10-CM | POA: Diagnosis not present

## 2024-05-04 DIAGNOSIS — Z85118 Personal history of other malignant neoplasm of bronchus and lung: Secondary | ICD-10-CM | POA: Diagnosis not present

## 2024-05-04 DIAGNOSIS — R110 Nausea: Secondary | ICD-10-CM | POA: Diagnosis not present

## 2024-05-04 DIAGNOSIS — Z79899 Other long term (current) drug therapy: Secondary | ICD-10-CM | POA: Insufficient documentation

## 2024-05-04 DIAGNOSIS — J449 Chronic obstructive pulmonary disease, unspecified: Secondary | ICD-10-CM | POA: Insufficient documentation

## 2024-05-04 LAB — CBC
HCT: 51.4 % (ref 39.0–52.0)
Hemoglobin: 16.4 g/dL (ref 13.0–17.0)
MCH: 28.2 pg (ref 26.0–34.0)
MCHC: 31.9 g/dL (ref 30.0–36.0)
MCV: 88.3 fL (ref 80.0–100.0)
Platelets: 243 K/uL (ref 150–400)
RBC: 5.82 MIL/uL — ABNORMAL HIGH (ref 4.22–5.81)
RDW: 14.3 % (ref 11.5–15.5)
WBC: 8.1 K/uL (ref 4.0–10.5)
nRBC: 0 % (ref 0.0–0.2)

## 2024-05-04 LAB — BASIC METABOLIC PANEL WITH GFR
Anion gap: 11 (ref 5–15)
BUN: 9 mg/dL (ref 8–23)
CO2: 25 mmol/L (ref 22–32)
Calcium: 9.3 mg/dL (ref 8.9–10.3)
Chloride: 101 mmol/L (ref 98–111)
Creatinine, Ser: 1.27 mg/dL — ABNORMAL HIGH (ref 0.61–1.24)
GFR, Estimated: >60 mL/min (ref >60)
Glucose, Bld: 100 mg/dL — ABNORMAL HIGH (ref 70–99)
Potassium: 4.3 mmol/L (ref 3.5–5.1)
Sodium: 137 mmol/L (ref 135–145)

## 2024-05-04 LAB — TROPONIN I (HIGH SENSITIVITY)
Troponin I (High Sensitivity): 5 ng/L (ref <18)
Troponin I (High Sensitivity): 5 ng/L (ref <18)

## 2024-05-04 NOTE — ED Notes (Signed)
 This RN explained to patient that there is an order for a second troponin and educated patient on need for second troponin. It was explained to patient that it may take at least an hour for lab to result the specimen. Patient requesting to have results called to him and does not want to stay for results at this time. EDP notified.

## 2024-05-04 NOTE — Discharge Instructions (Addendum)
 Your initial bloodwork did not show signs of a heart attack. Your kidney function is slightly abnormal indicating that you may be a bit dehydrated. I recommend remaining well hydrated and taking in a lot of fluids.   I recommend following up closely with your cardiologist for further evaluation.  Please seek medical attention if your chest pain returns, especially if having shortness of breath, nausea/vomiting, sweating.

## 2024-05-04 NOTE — Progress Notes (Signed)
  Date of Visit: 05/04/2024   SUBJECTIVE:   HPI:  Curtis Henry presents today for a same day appointment to discuss chest pain.  He is accompanied by his coworker.  Reports recurrent episodes of lightheadedness and dizziness for the last year.  For the last 3 weeks has had chest pain fairly constantly that radiates to his left arm, feels like pressure in his chest.  It is accompanied by nausea.  This morning he was at work and appeared anxious with tremors.  Complained of chest pain and indigestion, also had dizziness and loss of appetite.  His coworkers called 911.  EMS responded and did EKGs, encouraged him to go to the hospital for evaluation but he declined transport, preferring outpatient workup.  He then scheduled a same day appointment here at the [facility].  Vitals at the time of EMS evaluation were blood pressure 186/96, pulse 57, 97% on room air, CBG 103.  He brought the EKGs done by EMS with him here today. No ST elevations or depressions seen.  Past medical history is significant for nonobstructive CAD, last evaluated on cath in 2024, heart failure with reduced ejection fraction, last EF 45 to 50%, paroxysmal SVT, hypertension, paroxysmal atrial fibrillation, COPD, lung cancer status post lobectomy, tobacco use disorder, GERD, hyperlipidemia.  OBJECTIVE:   BP (!) 178/87   Pulse (!) 52   Ht 6' (1.829 m)   Wt 129 lb 12.8 oz (58.9 kg)   SpO2 100%   BMI 17.60 kg/m  Gen: No acute distress, pleasant, cooperative HEENT: Normocephalic, atraumatic Lungs: Normal effort on room air Neuro: Alert, grossly nonfocal, speech normal Ext: No edema seen  ASSESSMENT/PLAN:   Chest pain Symptoms concerning for ACS-typical symptoms including pressure like chest pain radiating to left arm accompanied by nausea and patient with known history of CAD and multiple cardiac risk factors.  Long discussion with patient encouraging him to go to the emergency room for evaluation, at minimum needs troponins to rule out  ACS.  He was very resistant to this idea, preferring evaluation as an outpatient, but eventually opted to go after I suggested that the wait time in the ED was reasonable at this time.  Patient then abruptly stood up and said he was going to the emergency room and left the clinic.  Message sent to charge nurse to inform them of patient's pending arrival.   [place] J. Donah, MD [facility]

## 2024-05-04 NOTE — ED Provider Notes (Signed)
 [name] Provider Note   CSN: [id number] Arrival date & time: 05/04/24  1008     Patient presents with: Chest Pain and Dizziness   Curtis Henry is a 68 y.o. male.     68 year old male with history of nonobstructive CAD, HFrEF (EF 45-50%), paroxysmal SVT, hypertension, PAF, COPD, lung cancer status post LLL lobectomy, tobacco use disorder, GERD, hyperlipidemia presenting from PCP office due to chest pain and pressure radiating to left arm with associated nausea.  The symptoms have been ongoing for the past 3 weeks and are intermittent in nature not necessarily associated with movement or exercise.  Has also had intermittent episodes of lightheadedness and dizziness for the past year.  Patient had worsening symptoms earlier today at work and EMS was called.  They did EKGs and encouraged him to go to the hospital but he declined transport.  Currently reports he is feeling well.  Denies active chest pain, dizziness, nausea, abdominal pain, shortness of breath.  Denies recent fevers, vomiting, leg swelling.   Chest Pain Associated symptoms: dizziness   Associated symptoms: no diaphoresis, no fever, no nausea, no palpitations, no shortness of breath, no vomiting and no weakness   Dizziness Associated symptoms: chest pain   Associated symptoms: no nausea, no palpitations, no shortness of breath, no vomiting and no weakness        Prior to Admission medications   Medication Sig Start Date End Date Taking? Authorizing Provider  apixaban  (ELIQUIS ) 5 MG TABS tablet Take 1 tablet (5 mg total) by mouth 2 (two) times daily. 04/07/24  Yes Baloch, Mahnoor, MD  atorvastatin  (LIPITOR) 80 MG tablet Take 1 tablet (80 mg total) by mouth daily. 12/26/23  Yes Cleaver, Josefa HERO, NP  FARXIGA  10 MG TABS tablet TAKE 1 TABLET BY MOUTH ONCE DAILY BEFORE BREAKFAST 04/23/24  Yes O'Neal, Darryle Ned, MD  gabapentin  (NEURONTIN ) 600 MG tablet Take 1 tablet (600 mg  total) by mouth at bedtime. 04/23/24  Yes Lafe Domino, DO  metoprolol  succinate (TOPROL -XL) 50 MG 24 hr tablet Take 1.5 tablets (75 mg total) by mouth at bedtime. Take with or immediately following a meal. 05/21/23  Yes Kate Lonni CROME, MD  Multiple Vitamin (MULTIVITAMIN) tablet Take 1 tablet by mouth daily.   Yes [provider]  Omega-3 Fatty Acids (FISH OIL) 1200 MG CAPS Take 1,200 mg by mouth daily.   Yes [provider]  sacubitril -valsartan  (ENTRESTO ) 24-26 MG Take 1 tablet by mouth 2 (two) times daily. 05/21/23  Yes Kate Lonni CROME, MD    Allergies: Lactose intolerance (gi)    Review of Systems  Constitutional:  Negative for diaphoresis and fever.  Respiratory:  Negative for shortness of breath.   Cardiovascular:  Positive for chest pain. Negative for palpitations and leg swelling.  Gastrointestinal:  Negative for nausea and vomiting.  Neurological:  Positive for dizziness. Negative for weakness.    Updated Vital Signs BP (!) 142/95   Pulse 66   Temp 97.9 F (36.6 C)   Resp 17   SpO2 100%   Physical Exam Constitutional:      General: He is not in acute distress. HENT:     Head: Normocephalic and atraumatic.  Neck:     Vascular: No JVD.  Cardiovascular:     Rate and Rhythm: Regular rhythm. Bradycardia present.     Heart sounds: Normal heart sounds. No murmur heard. Pulmonary:     Effort: Pulmonary effort is normal. No respiratory distress.  Breath sounds: Normal breath sounds.  Abdominal:     Palpations: Abdomen is soft.     Tenderness: There is no abdominal tenderness.  Musculoskeletal:     Right lower leg: No edema.     Left lower leg: No edema.  Neurological:     General: No focal deficit present.     Mental Status: He is alert.     (all labs ordered are listed, but only abnormal results are displayed) Labs Reviewed  BASIC METABOLIC PANEL WITH GFR - Abnormal; Notable for the following components:      Result Value    Glucose, Bld 100 (*)    Creatinine, Ser 1.27 (*)    All other components within normal limits  CBC - Abnormal; Notable for the following components:   RBC 5.82 (*)    All other components within normal limits  TROPONIN I (HIGH SENSITIVITY)  TROPONIN I (HIGH SENSITIVITY)    EKG: EKG Interpretation Date/Time:  Monday May 04 2024 10:19:50 EDT Ventricular Rate:  59 PR Interval:  134 QRS Duration:  80 QT Interval:  412 QTC Calculation: 407 R Axis:   83  Text Interpretation: Sinus bradycardia with Premature atrial complexes Anterior infarct , age undetermined Abnormal ECG When compared with ECG of 29-Jan-2024 09:41, PREVIOUS ECG IS PRESENT Confirmed by Tonia Chew [phone]) on 05/04/2024 11:53:00 AM  Radiology: [facility] Chest 2 View Result Date: 05/04/2024 CLINICAL DATA:  Chest pain EXAM: CHEST - 2 VIEW COMPARISON:  03/13/2023 FINDINGS: Lungs are hyperexpanded. Architectural distortion/scarring noted at the left base with probable tiny left effusion. The lungs are clear without focal pneumonia, edema, pneumothorax or pleural effusion. The cardiopericardial silhouette is within normal limits for size. No acute bony abnormality. IMPRESSION: Hyperexpansion with architectural distortion/scarring at the left base and probable tiny left effusion. Electronically Signed   By: Camellia Candle M.D.   On: 05/04/2024 10:51     Procedures   Medications Ordered in the ED - No data to display                                  Medical Decision Making 68 year old male with history of nonobstructive CAD, HFrEF (EF 45-50%), paroxysmal SVT, hypertension, PAF, COPD, lung cancer status post LLL lobectomy, tobacco use disorder, GERD, hyperlipidemia presenting from PCP office due to intermittent chest pain and pressure radiating to left arm with associated nausea ongoing for about the past 3 weeks.   Afebrile and hemodynamically stable  Given his significant cardiac and pulmonary history there is suspicion for  ACS although he is currently asymptomatic.  His EKG did not show evidence of acute ischemia.  His chest x-ray is also unremarkable aside from scarring and tiny effusion at the left base likely sequela of his lobectomy. He is on Eliquis  for his history of PAF and has been compliant with this.  No evidence of DVT.  Low suspicion for PE. Obtain troponins, CBC, BMP  12:49 PM Initial troponin wnl. CBC, BMP unremarkable aside from elevated Cr to 1.27. Recommend oral hydration for this Patient strongly prefers to discharge at this time. We discussed I do recommend waiting for the 2nd troponin to result but he does insist on discharge. He is ok with having the troponin drawn before he goes but does not want to wait for the result. He understands the risks including risk of this being ACS requiring further workup and/or admission and risk of death. I feel  he has capacity to make this decision. Reviewed return precautions with patient. Recommend outpatient cardiology f/u.  1:55 PM 2nd troponin WNL   Amount and/or Complexity of Data Reviewed Labs: ordered. Radiology: ordered.       Final diagnoses:  Acute chest pain    ED Discharge Orders          Ordered    Ambulatory referral to Cardiology       Comments: If you have not heard from the Cardiology office within the next 72 hours please call [phone]. Previously seen by Dr. Kate   05/04/24 1307               Romelle Booty, MD 05/04/24 1356    Tonia Chew, MD 05/05/24 [phone]

## 2024-05-04 NOTE — ED Triage Notes (Signed)
 Pt c.o left sided chest pain and dizziness intermittently for the past 3 weeks.

## 2024-05-04 NOTE — ED Notes (Signed)
 Pt transported to xray

## 2024-05-07 ENCOUNTER — Ambulatory Visit: Payer: Self-pay

## 2024-05-07 NOTE — Telephone Encounter (Signed)
 FYI Only or Action Required?: FYI only for provider.  Patient was last seen in primary care on 05/04/2024 by Donah Laymon PARAS, MD.  Called Nurse Triage reporting Knee Pain.  Symptoms began several weeks ago.  Interventions attempted: Nothing.  Symptoms are: unchanged.  Triage Disposition: See Physician Within 24 Hours  Patient/caregiver understands and will follow disposition?: Yes     Copied from CRM #[id number]. Topic: Clinical - Red Word Triage >> May 07, 2024  1:58 PM Jayma L wrote: Red Word that prompted transfer to Nurse Triage: patient called in stated he is establish but wants to go to [facility] now. And he wants a male provider but he's having left side knee pain that getting worst over the past 2-3 weeks and its getting to the point he can't walk. Asking to be seen Reason for Disposition  [1] Very swollen joint AND [2] no fever  Answer Assessment - Initial Assessment Questions 1. LOCATION and RADIATION: Where is the pain located?      Left knee 2. QUALITY: What does the pain feel like?  (e.g., sharp, dull, aching, burning)     aching 3. SEVERITY: How bad is the pain? What does it keep you from doing?   (Scale 1-10; or mild, moderate, severe)     Moderate to severe, using ice 4. ONSET: When did the pain start? Does it come and go, or is it there all the time?     2-3 weeks ago 5. RECURRENT: Have you had this pain before? If Yes, ask: When, and what happened then?     denies 6. SETTING: Has there been any recent work, exercise or other activity that involved that part of the body?      denies 7. AGGRAVATING FACTORS: What makes the knee pain worse? (e.g., walking, climbing stairs, running)     walking 8. ASSOCIATED SYMPTOMS: Is there any swelling or redness of the knee?     Swelling 9. OTHER SYMPTOMS: Do you have any other symptoms? (e.g., calf pain, chest pain, difficulty breathing, fever)     Sob at time, hx of this 10. PREGNANCY: Is  there any chance you are pregnant? When was your last menstrual period?       na  Protocols used: Knee Pain-A-AH

## 2024-05-08 ENCOUNTER — Ambulatory Visit (HOSPITAL_BASED_OUTPATIENT_CLINIC_OR_DEPARTMENT_OTHER)

## 2024-05-08 ENCOUNTER — Encounter (HOSPITAL_BASED_OUTPATIENT_CLINIC_OR_DEPARTMENT_OTHER): Admitting: Family Medicine

## 2024-05-08 ENCOUNTER — Encounter (HOSPITAL_BASED_OUTPATIENT_CLINIC_OR_DEPARTMENT_OTHER): Payer: Self-pay | Admitting: Physician Assistant

## 2024-05-08 ENCOUNTER — Other Ambulatory Visit (HOSPITAL_BASED_OUTPATIENT_CLINIC_OR_DEPARTMENT_OTHER): Payer: Self-pay | Admitting: Physician Assistant

## 2024-05-08 ENCOUNTER — Ambulatory Visit (INDEPENDENT_AMBULATORY_CARE_PROVIDER_SITE_OTHER): Admitting: Physician Assistant

## 2024-05-08 DIAGNOSIS — M25562 Pain in left knee: Secondary | ICD-10-CM

## 2024-05-08 DIAGNOSIS — M25462 Effusion, left knee: Secondary | ICD-10-CM | POA: Diagnosis not present

## 2024-05-08 NOTE — Progress Notes (Signed)
 Office Visit Note   Patient: Curtis Henry           Date of Birth: 1955/09/28           MRN: [id number] Visit Date: 05/08/2024              Requested by: Lonnie Earnest, MD [address] [place],  [place] [address] PCP: Lonnie Earnest, MD   Assessment & Plan: Visit Diagnoses:  1. Acute pain of left knee     Plan: Patient is a 68 year old gentleman who has a 2 to 3-week history of left knee pain.  Denies any particular injury no previous history of knee problems.  Does have a few mechanical symptoms.  Cannot appreciate a lot of degenerative changes in his knee.  Would recommend to try an injection and follow-up in 3 weeks to see how he is doing if he continues to have mechanical symptoms could consider an MRI in the future.  Have also given him some home exercises as bilaterally he does have quite a bit of hip atrophy which I think is contributing to the problem.  He is on Eliquis  so does not take ibuprofen   Follow-Up Instructions: No follow-ups on file.   Orders:  No orders of the defined types were placed in this encounter.  No orders of the defined types were placed in this encounter.     Procedures: No procedures performed   Clinical Data: No additional findings.   Subjective: Chief Complaint  Patient presents with   Left Knee - Pain    HPI patient is a 68 year old gentleman comes in with a chief complaint of left knee pain.  No known injury.  He is taking Tylenol  using IcyHot he keeps feeling a popping noise and locking up on him  Review of Systems  All other systems reviewed and are negative.    Objective: Vital Signs: There were no vitals taken for this visit.  Physical Exam Constitutional:      Appearance: Normal appearance.  Pulmonary:     Effort: Pulmonary effort is normal.  Skin:    General: Skin is warm and dry.  Neurological:     General: No focal deficit present.     Mental Status: He is alert and oriented to person, place, and time.      Ortho Exam Examination of his knee no effusion no erythema he does have quite a bit of quad atrophy atrophy on both legs.  Compartments are soft and compressible just diffuse tenderness over the medial lateral joint line.  Neurologically intact good varus valgus anterior stability Specialty Comments:  No specialty comments available.  Imaging: No results found.   PMFS History: Patient Active Problem List   Diagnosis Date Noted   Sciatica of left side 08/24/2023   Chronic systolic heart failure (HCC) 04/02/2023   Orthostatic syncope 03/13/2023   Atrial flutter (HCC) 11/28/2022   Paroxysmal supraventricular tachycardia (HCC) 11/28/2022   Chest pain 11/01/2022   Tingling in extremities 09/15/2022   Dizziness 09/06/2022   Elevated blood pressure reading 10/10/2021   Restless legs syndrome 10/10/2021   Prediabetes 10/10/2021   Postop check 04/13/2020   Annual physical exam 03/22/2020   Seasonal allergies 03/22/2020   Paroxysmal atrial fibrillation (HCC) 01/31/2018   HTN (hypertension) 01/31/2018   S/P lobectomy of lung 01/17/2018   COPD (chronic obstructive pulmonary disease) (HCC) 12/20/2017   Poor dentition 03/19/2017   Prurigo nodularis 01/16/2017   Tobacco abuse 12/16/2016   HYPERCHOLESTEROLEMIA 11/14/2006  IMPOTENCE INORGANIC 11/14/2006   GASTROESOPHAGEAL REFLUX, NO ESOPHAGITIS 11/14/2006   Past Medical History:  Diagnosis Date   Anxiety state 03/08/2018   Bursitis of left shoulder    Essential hypertension 01/31/2018   In 2019, has not been on medication since this time.   GERD (gastroesophageal reflux disease)    Hyperlipidemia    Restless leg syndrome    Squamous cell lung cancer (HCC)     Family History  Problem Relation Age of Onset   Esophageal cancer Brother    Colon cancer Father 36   Arthritis Other     Past Surgical History:  Procedure Laterality Date   CIRCUMCISION  12/13/2003   COLONOSCOPY     RIGHT/LEFT HEART CATH AND CORONARY ANGIOGRAPHY  N/A 01/17/2023   Procedure: RIGHT/LEFT HEART CATH AND CORONARY ANGIOGRAPHY;  Surgeon: Burnard Debby LABOR, MD;  Location: [facility];  Service: Cardiovascular;  Laterality: N/A;   VIDEO ASSISTED THORACOSCOPY (VATS)/ LOBECTOMY Left 01/17/2018   Procedure: left VIDEO ASSISTED THORACOSCOPY (VATS) with left lower lobe wedge resection and LOBECTOMY;  Surgeon: Fleeta Hanford Coy, MD;  Location: [facility] OR;  Service: Thoracic;  Laterality: Left;   Social History   Occupational History   Occupation: [employer]  Tobacco Use   Smoking status: Former    Current packs/day: 0.00    Average packs/day: 0.1 packs/day for 46.0 years (4.6 ttl pk-yrs)    Types: Cigarettes    Start date: 09/17/1974    Quit date: 2022    Years since quitting: 3.6   Smokeless tobacco: Never  Vaping Use   Vaping status: Some Days   Substances: Nicotine , Flavoring  Substance and Sexual Activity   Alcohol use: Not Currently   Drug use: Yes    Frequency: 7.0 times per week    Types: Marijuana    Comment: smokes marijuana; crack use distant past >30 yrs ago   Sexual activity: Not Currently

## 2024-05-11 ENCOUNTER — Other Ambulatory Visit: Payer: Self-pay | Admitting: Cardiology

## 2024-05-11 ENCOUNTER — Ambulatory Visit: Admitting: Student

## 2024-05-13 ENCOUNTER — Other Ambulatory Visit (HOSPITAL_COMMUNITY): Payer: Self-pay

## 2024-05-13 ENCOUNTER — Telehealth: Payer: Self-pay | Admitting: Pharmacy Technician

## 2024-05-13 NOTE — Telephone Encounter (Signed)
   Insurance will pay for brand. Per test claim 0.00 for 90 days. I called [facility] and they now filled brand

## 2024-05-24 ENCOUNTER — Other Ambulatory Visit: Payer: Self-pay | Admitting: Cardiology

## 2024-05-24 NOTE — Progress Notes (Unsigned)
 Cardiology Office Note:    Date:  05/24/2024   ID:  Curtis Henry, DOB 29-Jan-1956, MRN [id number]  PCP:  Lonnie Earnest, MD  Cardiologist:  Lonni LITTIE Nanas, MD  Electrophysiologist:  None   Referring MD: Joshua Domino, DO   No chief complaint on file.   History of Present Illness:    Curtis Henry is a 68 y.o. male with a hx of chronic systolic heart failure, atrial fibrillation/flutter, hypertension, hyperlipidemia, squamous cell lung cancer who presents for follow-up.  He was referred by Dr. Joshua for evaluation of chest pain, initially seen 01/01/2023.  Zio patch x 14 days 10/2022 showed 1 episode of NSVT lasting 5 beats, 01/22/2020 episodes of SVT with longest lasting 1 minute 37 seconds with average rate 173 bpm, atrial flutter with 2% burden with average rate 140 bpm, longest episode lasting 35 minutes 52 seconds with average rate 149 bpm, frequent PACs (7.7% of beats).  Echocardiogram 12/21/2022 showed EF 40 to 45%, global hypokinesis, indeterminate diastolic function, mildly reduced RV function.  At initial clinic visit, cardiac cath was ordered for workup of chest pain and systolic heart failure.  However his creatinine was 1.85 on 01/01/2023 (increased from 1.07 on 11/28/2022), so catheterization was canceled.  HCTZ and olmesartan  were discontinued.  Recheck BMET on 01/14/2023 showed creatinine down to 1.07.  Underwent RHC/LHC on 01/17/2023, which showed mild nonobstructive CAD (20% ostial left main stenosis, 30% ramus), low normal right heart pressures (RA 1, RV 22/1, PA 23/6/16, PCWP 5, CI 2.2).  Cardiac MRI 05/12/2023 showed LVEF 55%, RVEF 51%, no LGE.  Echocardiogram 09/2023 showed EF 45 to 50%, normal RV function, mild to moderate mitral regurgitation.  Repeat Zio patch on metoprolol  01/2024 showed no significant arrhythmias.  Since last clinic visit,  he reports he is doing well.  States that his lightheadedness and palpitations have resolved.  Does report gets rare sharp chest pain  and also reports occasional dyspnea.  Denies any lower extremity edema.  He is taking Eliquis , denies any bleeding issues.  Wt Readings from Last 3 Encounters:  05/04/24 129 lb 12.8 oz (58.9 kg)  01/29/24 132 lb 14.4 oz (60.3 kg)  08/23/23 129 lb 3.2 oz (58.6 kg)     Past Medical History:  Diagnosis Date   Anxiety state 03/08/2018   Bursitis of left shoulder    Essential hypertension 01/31/2018   In 2019, has not been on medication since this time.   GERD (gastroesophageal reflux disease)    Hyperlipidemia    Restless leg syndrome    Squamous cell lung cancer [facility])     Past Surgical History:  Procedure Laterality Date   CIRCUMCISION  12/13/2003   COLONOSCOPY     RIGHT/LEFT HEART CATH AND CORONARY ANGIOGRAPHY N/A 01/17/2023   Procedure: RIGHT/LEFT HEART CATH AND CORONARY ANGIOGRAPHY;  Surgeon: Burnard Debby LABOR, MD;  [facility];  Service: Cardiovascular;  Laterality: N/A;   VIDEO ASSISTED THORACOSCOPY (VATS)/ LOBECTOMY Left 01/17/2018   Procedure: left VIDEO ASSISTED THORACOSCOPY (VATS) with left lower lobe wedge resection and LOBECTOMY;  Surgeon: Fleeta Hanford Coy, MD;  Location: [facility] OR;  Service: Thoracic;  Laterality: Left;    Current Medications: No outpatient medications have been marked as taking for the 05/27/24 encounter (Appointment) with Nanas Lonni LITTIE, MD.     Allergies:   Lactose intolerance (gi)   Social History   Socioeconomic History   Marital status: Widowed    Spouse name: Not on file   Number of  children: 5   Years of education: Not on file   Highest education level: Not on file  Occupational History   Occupation: [employer]  Tobacco Use   Smoking status: Former    Current packs/day: 0.00    Average packs/day: 0.1 packs/day for 46.0 years (4.6 ttl pk-yrs)    Types: Cigarettes    Start date: 09/17/1974    Quit date: 2022    Years since quitting: 3.6   Smokeless tobacco: Never  Vaping Use   Vaping status: Some Days   Substances:  Nicotine , Flavoring  Substance and Sexual Activity   Alcohol use: Not Currently   Drug use: Yes    Frequency: 7.0 times per week    Types: Marijuana    Comment: smokes marijuana; crack use distant past >30 yrs ago   Sexual activity: Not Currently  Other Topics Concern   Not on file  Social History Narrative   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    Social Drivers of Health   Financial Resource Strain: Medium Risk (12/25/2022)   Overall Financial Resource Strain (CARDIA)    Difficulty of Paying Living Expenses: Somewhat hard  Food Insecurity: Food Insecurity Present (12/25/2022)   Hunger Vital Sign    Worried About Running Out of Food in the Last Year: Sometimes true    Ran Out of Food in the Last Year: Sometimes true  Transportation Needs: No Transportation Needs (12/25/2022)   PRAPARE - Administrator, Civil Service (Medical): No    Lack of Transportation (Non-Medical): No  Physical Activity: Inactive (12/25/2022)   Exercise Vital Sign    Days of Exercise per Week: 0 days    Minutes of Exercise per Session: 0 min  Stress: Stress Concern Present (12/25/2022)   [employer] of Occupational Health - Occupational Stress Questionnaire    Feeling of Stress : To some extent  Social Connections: Socially Isolated (12/25/2022)   Social Connection and Isolation Panel    Frequency of Communication with Friends and Family: More than three times a week    Frequency of Social Gatherings with Friends and Family: More than three times a week    Attends Religious Services: Never    Database administrator or Organizations: No    Attends Banker Meetings: Never    Marital Status: Widowed     Family History: The patient's family history includes Arthritis in an other family member; Colon cancer (age of onset: 27) in his father; Esophageal cancer in his brother.  ROS:   Please see the history of present illness.     All other systems reviewed and are  negative.  EKGs/Labs/Other Studies Reviewed:    The following studies were reviewed today:   EKG:   01/01/2023: Normal sinus rhythm, rate 65, no ST abnormalities 02/15/2023: Normal sinus rhythm, rate 61, no ST abnormalities 01/29/2024: Sinus bradycardia, rate 53, no ST abnormalities  Recent Labs: 01/29/2024: ALT 22; Magnesium 2.1 05/04/2024: BUN 9; Creatinine, Ser 1.27; Hemoglobin 16.4; Platelets 243; Potassium 4.3; Sodium 137  Recent Lipid Panel    Component Value Date/Time   CHOL 204 (H) 01/29/2024 1055   TRIG 102 01/29/2024 1055   HDL 55 01/29/2024 1055   CHOLHDL 3.7 01/29/2024 1055   CHOLHDL 3.9 04/18/2010 0545   VLDL 12 04/18/2010 0545   LDLCALC 131 (H) 01/29/2024 1055    Physical Exam:    VS:  There were no vitals taken for this visit.    Wt Readings from  Last 3 Encounters:  05/04/24 129 lb 12.8 oz (58.9 kg)  01/29/24 132 lb 14.4 oz (60.3 kg)  08/23/23 129 lb 3.2 oz (58.6 kg)     GEN:  Well nourished, well developed in no acute distress HEENT: Normal NECK: No JVD; No carotid bruits LYMPHATICS: No lymphadenopathy CARDIAC: RRR, no murmurs, rubs, gallops RESPIRATORY:  Clear to auscultation without rales, wheezing or rhonchi  ABDOMEN: Soft, non-tender, non-distended MUSCULOSKELETAL:  No edema; No deformity  SKIN: Warm and dry NEUROLOGIC:  Alert and oriented x 3 PSYCHIATRIC:  Normal affect   ASSESSMENT:    No diagnosis found.   PLAN:    Chronic systolic heart failure:Echocardiogram 12/21/2022 showed EF 40 to 45%, global hypokinesis, indeterminate diastolic function, mildly reduced RV function.  Reported atypical chest pain.  Underwent RHC/LHC on 01/17/2023, which showed mild nonobstructive CAD (20% ostial left main stenosis, 30% ramus), low normal right heart pressures (RA 1, RV 22/1, PA 23/6/16, PCWP 5, CI 2.2).  Cardiac MRI 05/12/2023 showed LVEF 55%, RVEF 51%, no LGE.  Echocardiogram 09/2023 showed EF 45 to 50%, normal RV function, mild to moderate mitral  regurgitation. -Continue Toprol -XL 75 mg daily -Continue Entresto  24-26 mg twice daily.  Check BMET*** -Continue Farxiga  10 mg daily -Start spironolactone ****  CAD: mild nonobstructive CAD (20% ostial left main stenosis, 30% ramus) on cath 01/17/2023 -Continue Eliquis  -On atorvastatin  80 mg daily  AKI: creatinine was 1.85 on 01/01/2023 (increased from 1.07 on 11/28/2022).  HCTZ and olmesartan  were discontinued.  Recheck BMET on 01/11/2023 showed improvement in creatinine to 1.07.  Check BMET***  Atrial flutter: Zio patch x 14 days 10/2022 showed 1 episode of NSVT lasting 5 beats,5221 episodes of SVT with longest lasting 1 minute 37 seconds with average rate 173 bpm, atrial flutter with 2% burden with average rate 140 bpm, longest episode lasting 35 minutes 52 seconds with average rate 149 bpm, frequent PACs (7.7% of beats).   -CHA2DS2-VASc 3 (CHF, hypertension, age).  Continue Eliquis  5 mg twice daily -Continue Toprol -XL 75 mg daily  SVT/frequent PACs: ZIO monitor results 10/2022 as above.  Continue Toprol -XL 75 mg daily.  The video repeat Zio patch on metoprolol  01/2024 showed no significant arrhythmias.  Hypertension: On Entresto , Toprol -XL.  Appears controlled  Hyperlipidemia: On atorvastatin  40 mg daily, LDL 89 on 02/15/2023.  Atorvastatin  increased to 80 mg daily for goal LDL less than 70 given CAD as above.  LDL 131 on 01/29/2024 but reports has been off his atorvastatin .  He reports compliance over the last several months, will recheck lipid panel***   RTC in 4 months***  Medication Adjustments/Labs and Tests Ordered: Current medicines are reviewed at length with the patient today.  Concerns regarding medicines are outlined above.  No orders of the defined types were placed in this encounter.  No orders of the defined types were placed in this encounter.   There are no Patient Instructions on file for this visit.   Signed, Lonni LITTIE Nanas, MD  05/24/2024 1:20 PM    [place]  [facility]

## 2024-05-27 ENCOUNTER — Ambulatory Visit (INDEPENDENT_AMBULATORY_CARE_PROVIDER_SITE_OTHER): Admitting: Cardiology

## 2024-05-27 ENCOUNTER — Encounter (HOSPITAL_BASED_OUTPATIENT_CLINIC_OR_DEPARTMENT_OTHER): Payer: Self-pay | Admitting: Cardiology

## 2024-05-27 VITALS — BP 170/80 | HR 67 | Resp 16 | Ht 72.0 in | Wt 125.0 lb

## 2024-05-27 DIAGNOSIS — I5022 Chronic systolic (congestive) heart failure: Secondary | ICD-10-CM

## 2024-05-27 DIAGNOSIS — E785 Hyperlipidemia, unspecified: Secondary | ICD-10-CM | POA: Diagnosis not present

## 2024-05-27 DIAGNOSIS — I10 Essential (primary) hypertension: Secondary | ICD-10-CM

## 2024-05-27 DIAGNOSIS — I2510 Atherosclerotic heart disease of native coronary artery without angina pectoris: Secondary | ICD-10-CM | POA: Diagnosis not present

## 2024-05-27 DIAGNOSIS — I471 Supraventricular tachycardia, unspecified: Secondary | ICD-10-CM | POA: Diagnosis not present

## 2024-05-27 DIAGNOSIS — Z5181 Encounter for therapeutic drug level monitoring: Secondary | ICD-10-CM | POA: Diagnosis not present

## 2024-05-27 DIAGNOSIS — I4892 Unspecified atrial flutter: Secondary | ICD-10-CM

## 2024-05-27 MED ORDER — SPIRONOLACTONE 25 MG PO TABS: ORAL_TABLET | ORAL | 1 refills | Status: DC

## 2024-05-27 NOTE — Patient Instructions (Signed)
 Medication Instructions:  START SPIRONOLACTONE  25 MG 1/2 TABLET DAILY   *If you need a refill on your cardiac medications before your next appointment, please call your pharmacy*  Lab Work: LP/BMET/MAGNESIUM TODAY   BEMT 1 WEEK AFTER START SPIRONOLACTONE    If you have labs (blood work) drawn today and your tests are completely normal, you will receive your results only by: MyChart Message (if you have MyChart) OR A paper copy in the mail If you have any lab test that is abnormal or we need to change your treatment, we will call you to review the results.  Testing/Procedures: Your physician has requested that you have an echocardiogram. Echocardiography is a painless test that uses sound waves to create images of your heart. It provides your doctor with information about the size and shape of your heart and how well your heart's chambers and valves are working. This procedure takes approximately one hour. There are no restrictions for this procedure. Please do NOT wear cologne, perfume, aftershave, or lotions (deodorant is allowed). Please arrive 15 minutes prior to your appointment time.  Please note: We ask at that you not bring children with you during ultrasound (echo/ vascular) testing. Due to room size and safety concerns, children are not allowed in the ultrasound rooms during exams. Our front office staff cannot provide observation of children in our lobby area while testing is being conducted. An adult accompanying a patient to their appointment will only be allowed in the ultrasound room at the discretion of the ultrasound technician under special circumstances. We apologize for any inconvenience.  4 MONTHS ABOUT A WEEK PRIOR TO FOLLOW UP   Follow-Up: At [facility], you and your health needs are our priority.  As part of our continuing mission to provide you with exceptional heart care, our providers are all part of one team.  This team includes your primary Cardiologist  (physician) and Advanced Practice Providers or APPs (Physician Assistants and Nurse Practitioners) who all work together to provide you with the care you need, when you need it.  Your next appointment:   4  month(s)  Provider:   DR KATE OR APP AT EITHER [facility] OR [facility] OFFICE   We recommend signing up for the patient portal called MyChart.  Sign up information is provided on this After Visit Summary.  MyChart is used to connect with patients for Virtual Visits (Telemedicine).  Patients are able to view lab/test results, encounter notes, upcoming appointments, etc.  Non-urgent messages can be sent to your provider as well.   To learn more about what you can do with MyChart, go to [email or web address].   Other Instructions MONITOR YOUR BLOOD PRESSURE DAILY FOR 2 WEEKS AND SEND READINGS VIA MYCHART OR CALL

## 2024-05-28 ENCOUNTER — Ambulatory Visit: Payer: Self-pay | Admitting: Cardiology

## 2024-05-28 DIAGNOSIS — H40023 Open angle with borderline findings, high risk, bilateral: Secondary | ICD-10-CM | POA: Diagnosis not present

## 2024-05-28 DIAGNOSIS — H43813 Vitreous degeneration, bilateral: Secondary | ICD-10-CM | POA: Diagnosis not present

## 2024-05-28 DIAGNOSIS — H524 Presbyopia: Secondary | ICD-10-CM | POA: Diagnosis not present

## 2024-05-28 DIAGNOSIS — H2513 Age-related nuclear cataract, bilateral: Secondary | ICD-10-CM | POA: Diagnosis not present

## 2024-05-28 DIAGNOSIS — H31092 Other chorioretinal scars, left eye: Secondary | ICD-10-CM | POA: Diagnosis not present

## 2024-05-28 DIAGNOSIS — H5203 Hypermetropia, bilateral: Secondary | ICD-10-CM | POA: Diagnosis not present

## 2024-05-28 DIAGNOSIS — E785 Hyperlipidemia, unspecified: Secondary | ICD-10-CM

## 2024-05-28 DIAGNOSIS — H52223 Regular astigmatism, bilateral: Secondary | ICD-10-CM | POA: Diagnosis not present

## 2024-05-28 DIAGNOSIS — H35371 Puckering of macula, right eye: Secondary | ICD-10-CM | POA: Diagnosis not present

## 2024-05-28 LAB — BASIC METABOLIC PANEL WITH GFR
BUN/Creatinine Ratio: 11 (ref 10–24)
BUN: 14 mg/dL (ref 8–27)
CO2: 22 mmol/L (ref 20–29)
Calcium: 9.7 mg/dL (ref 8.6–10.2)
Chloride: 99 mmol/L (ref 96–106)
Creatinine, Ser: 1.23 mg/dL (ref 0.76–1.27)
Glucose: 96 mg/dL (ref 70–99)
Potassium: 5 mmol/L (ref 3.5–5.2)
Sodium: 138 mmol/L (ref 134–144)
eGFR: 64 mL/min/1.73 (ref >59)

## 2024-05-28 LAB — LIPID PANEL
Chol/HDL Ratio: 2.9 ratio (ref 0.0–5.0)
Cholesterol, Total: 180 mg/dL (ref 100–199)
HDL: 62 mg/dL (ref >39)
LDL Chol Calc (NIH): 103 mg/dL — ABNORMAL HIGH (ref 0–99)
Triglycerides: 80 mg/dL (ref 0–149)
VLDL Cholesterol Cal: 15 mg/dL (ref 5–40)

## 2024-05-28 LAB — MAGNESIUM: Magnesium: 2.3 mg/dL (ref 1.6–2.3)

## 2024-05-29 ENCOUNTER — Ambulatory Visit (HOSPITAL_BASED_OUTPATIENT_CLINIC_OR_DEPARTMENT_OTHER): Admitting: Student

## 2024-05-29 DIAGNOSIS — M25562 Pain in left knee: Secondary | ICD-10-CM | POA: Diagnosis not present

## 2024-05-29 NOTE — Progress Notes (Signed)
 Chief Complaint: Left knee pain     History of Present Illness:    Curtis Henry is a 68 y.o. male who presents today for follow-up regarding left knee pain.  He saw Marcos 2 weeks ago at which time he received a cortisone injection.  Overall he reports that this has helped relieve his symptoms about 60%.  Today he is experiencing some pain about the medial and posterior knee without any popping or buckling.  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  He has been icing and using Tylenol .   Surgical History:   None  PMH/PSH/Family History/Social History/Meds/Allergies:    Past Medical History:  Diagnosis Date   Anxiety state 03/08/2018   Bursitis of left shoulder    Essential hypertension 01/31/2018   In 2019, has not been on medication since this time.   GERD (gastroesophageal reflux disease)    Hyperlipidemia    Restless leg syndrome    Squamous cell lung cancer [facility])    Past Surgical History:  Procedure Laterality Date   CIRCUMCISION  12/13/2003   COLONOSCOPY     RIGHT/LEFT HEART CATH AND CORONARY ANGIOGRAPHY N/A 01/17/2023   Procedure: RIGHT/LEFT HEART CATH AND CORONARY ANGIOGRAPHY;  Surgeon: Burnard Debby LABOR, MD;  Location: [facility];  Service: Cardiovascular;  Laterality: N/A;   VIDEO ASSISTED THORACOSCOPY (VATS)/ LOBECTOMY Left 01/17/2018   Procedure: left VIDEO ASSISTED THORACOSCOPY (VATS) with left lower lobe wedge resection and LOBECTOMY;  Surgeon: Fleeta Hanford Coy, MD;  Location: [facility] OR;  Service: Thoracic;  Laterality: Left;   Social History   Socioeconomic History   Marital status: Widowed    Spouse name: Not on file   Number of children: 5   Years of education: Not on file   Highest education level: Not on file  Occupational History   Occupation: [employer]  Tobacco Use   Smoking status: Former    Current packs/day: 0.00    Average packs/day: 0.1 packs/day for  46.0 years (4.6 ttl pk-yrs)    Types: Cigarettes    Start date: 09/17/1974    Quit date: 2022    Years since quitting: 3.6   Smokeless tobacco: Never  Vaping Use   Vaping status: Some Days   Substances: Nicotine , Flavoring  Substance and Sexual Activity   Alcohol use: Not Currently   Drug use: Yes    Frequency: 7.0 times per week    Types: Marijuana    Comment: smokes marijuana; crack use distant past >30 yrs ago   Sexual activity: Not Currently  Other Topics Concern   Not on file  Social History Narrative   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    Social Drivers of Health   Financial Resource Strain: Medium Risk (12/25/2022)   Overall Financial Resource Strain (CARDIA)    Difficulty of Paying Living Expenses: Somewhat hard  Food Insecurity: Food Insecurity Present (12/25/2022)   Hunger Vital Sign    Worried About Running Out of Food in the Last Year: Sometimes true    Ran Out of Food in the Last Year: Sometimes true  Transportation Needs: No Transportation Needs (12/25/2022)   PRAPARE - Administrator, Civil Service (Medical): No    Lack of Transportation (Non-Medical):  No  Physical Activity: Inactive (12/25/2022)   Exercise Vital Sign    Days of Exercise per Week: 0 days    Minutes of Exercise per Session: 0 min  Stress: Stress Concern Present (12/25/2022)   [employer] of Occupational Health - Occupational Stress Questionnaire    Feeling of Stress : To some extent  Social Connections: Socially Isolated (12/25/2022)   Social Connection and Isolation Panel    Frequency of Communication with Friends and Family: More than three times a week    Frequency of Social Gatherings with Friends and Family: More than three times a week    Attends Religious Services: Never    Database administrator or Organizations: No    Attends Banker Meetings: Never    Marital Status: Widowed   Family History  Problem Relation Age of Onset   Esophageal cancer Brother     Colon cancer Father 10   Arthritis Other    Allergies  Allergen Reactions   Lactose Intolerance (Gi) Nausea Only and Other (See Comments)    Unsettled bowel.    Current Outpatient Medications  Medication Sig Dispense Refill   apixaban  (ELIQUIS ) 5 MG TABS tablet Take 1 tablet (5 mg total) by mouth 2 (two) times daily. 180 tablet 0   atorvastatin  (LIPITOR) 80 MG tablet Take 1 tablet (80 mg total) by mouth daily. 90 tablet 2   FARXIGA  10 MG TABS tablet TAKE 1 TABLET BY MOUTH ONCE DAILY BEFORE BREAKFAST 90 tablet 2   gabapentin  (NEURONTIN ) 600 MG tablet Take 1 tablet (600 mg total) by mouth at bedtime. 90 tablet 0   metoprolol  succinate (TOPROL -XL) 50 MG 24 hr tablet TAKE 1 & 1/2 (ONE & ONE-HALF) TABLETS BY MOUTH AT BEDTIME. TAKE WITH OR IMMEDIATELY FOLLOWING A MEAL 135 tablet 1   Multiple Vitamin (MULTIVITAMIN) tablet Take 1 tablet by mouth daily.     Omega-3 Fatty Acids (FISH OIL) 1200 MG CAPS Take 1,200 mg by mouth daily.     sacubitril -valsartan  (ENTRESTO ) 24-26 MG Take 1 tablet by mouth twice daily 180 tablet 3   spironolactone  (ALDACTONE ) 25 MG tablet TAKE 1/2 TABLET DAILY 45 tablet 1   No current facility-administered medications for this visit.   No results found.  Review of Systems:   A ROS was performed including pertinent positives and negatives as documented in the HPI.  Physical Exam :   Constitutional: NAD and appears stated age Neurological: Alert and oriented Psych: Appropriate affect and cooperative There were no vitals taken for this visit.   Comprehensive Musculoskeletal Exam:    Left knee exam demonstrates tenderness over the medial joint line and in the popliteal fossa.  No significant effusion or crepitus.  Active range of motion is full from -3 to 130 degrees.  Stable collaterals with varus and valgus stress.  There is some quad atrophy noted bilaterally.  Imaging:     Assessment:   68 y.o. male with continued atraumatic medial and posterior left knee  pain.  Cortisone injection performed at last visit has given him about 60% of relief.  X-rays taken at that time show only mild degenerative changes.  I do think patient will continue to benefit from strengthening and he has been performing home exercise program.  Also discussed consider referral to formal physical therapy to help strengthen and improve function.  Patient will consider this however this time he we will continue with HEP and plans to follow-up if pain worsens.  Plan :    -  Return to clinic as needed and consider referral to physical therapy     I personally saw and evaluated the patient, and participated in the management and treatment plan.  Leonce Reveal, PA-C Orthopedics

## 2024-06-16 MED ORDER — EZETIMIBE 10 MG PO TABS: 10.0000 mg | ORAL_TABLET | Freq: Every day | ORAL | 3 refills | Status: AC

## 2024-07-08 ENCOUNTER — Other Ambulatory Visit: Payer: Self-pay | Admitting: Family Medicine

## 2024-07-08 DIAGNOSIS — I4892 Unspecified atrial flutter: Secondary | ICD-10-CM

## 2024-07-19 ENCOUNTER — Other Ambulatory Visit: Payer: Self-pay | Admitting: Family Medicine

## 2024-07-19 DIAGNOSIS — G2581 Restless legs syndrome: Secondary | ICD-10-CM

## 2024-07-20 ENCOUNTER — Encounter: Payer: Self-pay | Admitting: Radiology

## 2024-07-30 ENCOUNTER — Encounter: Payer: Self-pay | Admitting: Family

## 2024-07-30 ENCOUNTER — Ambulatory Visit: Admitting: Family

## 2024-07-30 VITALS — BP 134/80 | HR 79 | Temp 98.4°F | Ht 72.0 in | Wt 124.4 lb

## 2024-07-30 DIAGNOSIS — Z85118 Personal history of other malignant neoplasm of bronchus and lung: Secondary | ICD-10-CM

## 2024-07-30 DIAGNOSIS — M792 Neuralgia and neuritis, unspecified: Secondary | ICD-10-CM

## 2024-07-30 DIAGNOSIS — G6289 Other specified polyneuropathies: Secondary | ICD-10-CM | POA: Diagnosis not present

## 2024-07-30 DIAGNOSIS — L989 Disorder of the skin and subcutaneous tissue, unspecified: Secondary | ICD-10-CM

## 2024-07-30 DIAGNOSIS — Z8679 Personal history of other diseases of the circulatory system: Secondary | ICD-10-CM | POA: Diagnosis not present

## 2024-07-30 DIAGNOSIS — I739 Peripheral vascular disease, unspecified: Secondary | ICD-10-CM | POA: Diagnosis not present

## 2024-07-30 DIAGNOSIS — Z87891 Personal history of nicotine dependence: Secondary | ICD-10-CM

## 2024-07-30 DIAGNOSIS — N529 Male erectile dysfunction, unspecified: Secondary | ICD-10-CM

## 2024-07-30 DIAGNOSIS — L281 Prurigo nodularis: Secondary | ICD-10-CM | POA: Diagnosis not present

## 2024-07-30 DIAGNOSIS — I10 Essential (primary) hypertension: Secondary | ICD-10-CM | POA: Diagnosis not present

## 2024-07-30 DIAGNOSIS — R252 Cramp and spasm: Secondary | ICD-10-CM

## 2024-07-30 DIAGNOSIS — Z972 Presence of dental prosthetic device (complete) (partial): Secondary | ICD-10-CM | POA: Insufficient documentation

## 2024-07-30 DIAGNOSIS — R739 Hyperglycemia, unspecified: Secondary | ICD-10-CM | POA: Diagnosis not present

## 2024-07-30 DIAGNOSIS — G629 Polyneuropathy, unspecified: Secondary | ICD-10-CM | POA: Insufficient documentation

## 2024-07-30 LAB — HEMOGLOBIN A1C: Hgb A1c MFr Bld: 6.5 % (ref 4.6–6.5)

## 2024-07-30 LAB — VITAMIN B12: Vitamin B-12: 585 pg/mL (ref 211–911)

## 2024-07-30 LAB — CK: Total CK: 407 U/L — ABNORMAL HIGH (ref 17–232)

## 2024-07-30 LAB — SEDIMENTATION RATE: Sed Rate: 9 mm/h (ref 0–20)

## 2024-07-30 LAB — MAGNESIUM: Magnesium: 2.1 mg/dL (ref 1.5–2.5)

## 2024-07-30 LAB — MICROALBUMIN / CREATININE URINE RATIO
Creatinine,U: 46.9 mg/dL
Microalb Creat Ratio: 120.2 mg/g — ABNORMAL HIGH (ref 0.0–30.0)
Microalb, Ur: 5.6 mg/dL — ABNORMAL HIGH (ref 0.0–1.9)

## 2024-07-30 NOTE — Progress Notes (Signed)
   Established Patient Office Visit  Subjective:      CC:  Chief Complaint  Patient presents with  . Establish Care    HPI: Curtis Henry is a 68 y.o. male presenting on 07/30/2024 for Establish Care .  Discussed the use of AI scribe software for clinical note transcription with the patient, who gave verbal consent to proceed.  History of Present Illness          Social history:  Relevant past medical, surgical, family and social history reviewed and updated as indicated. Interim medical history since our last visit reviewed.  Allergies and medications reviewed and updated.  DATA REVIEWED: CHART IN EPIC     ROS: Negative unless specifically indicated above in HPI.    Current Outpatient Medications:  .  atorvastatin  (LIPITOR) 80 MG tablet, Take 1 tablet (80 mg total) by mouth daily., Disp: 90 tablet, Rfl: 2 .  ELIQUIS  5 MG TABS tablet, Take 1 tablet by mouth twice daily, Disp: 180 tablet, Rfl: 0 .  ezetimibe (ZETIA) 10 MG tablet, Take 1 tablet (10 mg total) by mouth daily., Disp: 90 tablet, Rfl: 3 .  FARXIGA  10 MG TABS tablet, TAKE 1 TABLET BY MOUTH ONCE DAILY BEFORE BREAKFAST, Disp: 90 tablet, Rfl: 2 .  gabapentin  (NEURONTIN ) 600 MG tablet, TAKE 1 TABLET BY MOUTH AT BEDTIME, Disp: 90 tablet, Rfl: 0 .  metoprolol  succinate (TOPROL -XL) 50 MG 24 hr tablet, TAKE 1 & 1/2 (ONE & ONE-HALF) TABLETS BY MOUTH AT BEDTIME. TAKE WITH OR IMMEDIATELY FOLLOWING A MEAL, Disp: 135 tablet, Rfl: 1 .  Multiple Vitamin (MULTIVITAMIN) tablet, Take 1 tablet by mouth daily., Disp: , Rfl:  .  Omega-3 Fatty Acids (FISH OIL) 1200 MG CAPS, Take 1,200 mg by mouth daily., Disp: , Rfl:  .  sacubitril -valsartan  (ENTRESTO ) 24-26 MG, Take 1 tablet by mouth twice daily, Disp: 180 tablet, Rfl: 3 .  spironolactone  (ALDACTONE ) 25 MG tablet, TAKE 1/2 TABLET DAILY, Disp: 45 tablet, Rfl: 1        Objective:        BP 134/80 (BP Location: Left Arm, Patient Position: Sitting, Cuff Size: Normal)    Pulse 79   Temp 98.4 F (36.9 C) (Temporal)   Ht 6' (1.829 m)   Wt 124 lb 6.4 oz (56.4 kg)   SpO2 98%   BMI 16.87 kg/m   Physical Exam   Wt Readings from Last 3 Encounters:  07/30/24 124 lb 6.4 oz (56.4 kg)  05/27/24 125 lb (56.7 kg)  05/04/24 129 lb 12.8 oz (58.9 kg)    Physical Exam       Results   Assessment & Plan:   Assessment and Plan Assessment & Plan       Return in about 1 month (around 08/29/2024) for leg pain.     Ginger Patrick, MSN, APRN, FNP-C [place] [facility]

## 2024-07-31 ENCOUNTER — Other Ambulatory Visit: Payer: Self-pay | Admitting: *Deleted

## 2024-07-31 ENCOUNTER — Ambulatory Visit: Payer: Self-pay | Admitting: Family

## 2024-07-31 DIAGNOSIS — R21 Rash and other nonspecific skin eruption: Secondary | ICD-10-CM

## 2024-07-31 DIAGNOSIS — R748 Abnormal levels of other serum enzymes: Secondary | ICD-10-CM

## 2024-07-31 DIAGNOSIS — M6281 Muscle weakness (generalized): Secondary | ICD-10-CM

## 2024-07-31 MED ORDER — PREDNISONE 1 MG PO TABS: 0.5000 mg | ORAL_TABLET | Freq: Every day | ORAL | 0 refills | Status: DC

## 2024-08-05 ENCOUNTER — Ambulatory Visit
Admission: RE | Admit: 2024-08-05 | Discharge: 2024-08-05 | Disposition: A | Discharge status: Home / Self Care | Source: Ambulatory Visit | Attending: Family | Admitting: Family

## 2024-08-05 DIAGNOSIS — M792 Neuralgia and neuritis, unspecified: Secondary | ICD-10-CM

## 2024-08-05 DIAGNOSIS — I739 Peripheral vascular disease, unspecified: Secondary | ICD-10-CM

## 2024-08-17 ENCOUNTER — Encounter: Payer: Self-pay | Admitting: Family

## 2024-08-17 ENCOUNTER — Ambulatory Visit (INDEPENDENT_AMBULATORY_CARE_PROVIDER_SITE_OTHER): Admitting: Family

## 2024-08-17 ENCOUNTER — Ambulatory Visit: Payer: Self-pay | Admitting: Family

## 2024-08-17 VITALS — BP 132/68 | HR 76 | Temp 98.0°F | Ht 72.0 in | Wt 127.2 lb

## 2024-08-17 DIAGNOSIS — R748 Abnormal levels of other serum enzymes: Secondary | ICD-10-CM

## 2024-08-17 DIAGNOSIS — M79605 Pain in left leg: Secondary | ICD-10-CM | POA: Diagnosis not present

## 2024-08-17 DIAGNOSIS — M6281 Muscle weakness (generalized): Secondary | ICD-10-CM

## 2024-08-17 DIAGNOSIS — M79604 Pain in right leg: Secondary | ICD-10-CM | POA: Diagnosis not present

## 2024-08-17 LAB — CK: Total CK: 314 U/L — ABNORMAL HIGH (ref 17–232)

## 2024-08-17 NOTE — Progress Notes (Signed)
 Established Patient Office Visit  Subjective:      CC:  Chief Complaint  Patient presents with   Follow-up    2 week follow up                                  HPI: Curtis Henry is a 68 y.o. male presenting on 08/17/2024 for Follow-up (2 week follow up) .  Discussed the use of AI scribe software for clinical note transcription with the patient, who gave verbal consent to proceed.  History of Present Illness Curtis Henry is a 68 year old male with diabetes who presents with leg pain and muscle weakness.  He experiences leg pain described as a burning sensation, akin to his legs being on fire. He is currently taking gabapentin  for this issue. An ankle-brachial index test was performed which was negative.  He has muscle weakness primarily in the upper thighs and lower legs, which sometimes affects his balance. He experiences cramps that have slightly improved but still occur, and the discomfort has extended to his toes. He has increased his water intake and started on prednisone , which has helped slightly. He is scheduled to see a rheumatologist on January 19th for further evaluation of his muscle weakness and elevated CK   His creatinine kinase levels were noted to be slightly elevated. No significant swelling is reported.  He has a history of diabetes, with his A1c recently measured at 6.5, indicating a progression from previous levels. He has experienced tingling in his fingers, which has resolved, possibly due to prednisone .          Social history:  Relevant past medical, surgical, family and social history reviewed and updated as indicated. Interim medical history since our last visit reviewed.  Allergies and medications reviewed and updated.  DATA REVIEWED: CHART IN EPIC     ROS: Negative unless specifically indicated above in HPI.    Current Outpatient Medications:    atorvastatin  (LIPITOR) 80 MG tablet, Take 1 tablet (80 mg total) by mouth daily.,  Disp: 90 tablet, Rfl: 2   ELIQUIS  5 MG TABS tablet, Take 1 tablet by mouth twice daily, Disp: 180 tablet, Rfl: 0   ezetimibe  (ZETIA ) 10 MG tablet, Take 1 tablet (10 mg total) by mouth daily., Disp: 90 tablet, Rfl: 3   FARXIGA  10 MG TABS tablet, TAKE 1 TABLET BY MOUTH ONCE DAILY BEFORE BREAKFAST, Disp: 90 tablet, Rfl: 2   gabapentin  (NEURONTIN ) 600 MG tablet, TAKE 1 TABLET BY MOUTH AT BEDTIME, Disp: 90 tablet, Rfl: 0   metoprolol  succinate (TOPROL -XL) 50 MG 24 hr tablet, TAKE 1 & 1/2 (ONE & ONE-HALF) TABLETS BY MOUTH AT BEDTIME. TAKE WITH OR IMMEDIATELY FOLLOWING A MEAL, Disp: 135 tablet, Rfl: 1   Multiple Vitamin (MULTIVITAMIN) tablet, Take 1 tablet by mouth daily., Disp: , Rfl:    Omega-3 Fatty Acids (FISH OIL) 1200 MG CAPS, Take 1,200 mg by mouth daily., Disp: , Rfl:    predniSONE  (DELTASONE ) 1 MG tablet, Take 0.5 tablets (0.5 mg total) by mouth daily with breakfast., Disp: 30 tablet, Rfl: 0   sacubitril -valsartan  (ENTRESTO ) 24-26 MG, Take 1 tablet by mouth twice daily, Disp: 180 tablet, Rfl: 3   spironolactone  (ALDACTONE ) 25 MG tablet, TAKE 1/2 TABLET DAILY, Disp: 45 tablet, Rfl: 1        Objective:        BP 132/68   Pulse 76   Temp  98 F (36.7 C) (Temporal)   Ht 6' (1.829 m)   Wt 127 lb 3.2 oz (57.7 kg)   SpO2 98%   BMI 17.25 kg/m   Physical Exam VITALS: P- 60, BP- 132/68 CARDIOVASCULAR: Heart auscultation normal. Peripheral pulses normal, no edema.  Wt Readings from Last 3 Encounters:  08/17/24 127 lb 3.2 oz (57.7 kg)  07/30/24 124 lb 6.4 oz (56.4 kg)  05/27/24 125 lb (56.7 kg)    Physical Exam Vitals reviewed.  Constitutional:      General: He is not in acute distress.    Appearance: Normal appearance. He is normal weight. He is not ill-appearing, toxic-appearing or diaphoretic.  Cardiovascular:     Rate and Rhythm: Normal rate and regular rhythm.     Pulses:          Dorsalis pedis pulses are 2+ on the right side and 1+ on the left side.  Pulmonary:      Effort: Pulmonary effort is normal.  Musculoskeletal:        General: Normal range of motion.     Right lower leg: No edema.     Left lower leg: No edema.  Neurological:     General: No focal deficit present.     Mental Status: He is alert and oriented to person, place, and time. Mental status is at baseline.  Psychiatric:        Mood and Affect: Mood normal.        Behavior: Behavior normal.        Thought Content: Thought content normal.        Judgment: Judgment normal.          Results LABS Creatinine Kinase: Elevated Magnesium: Normal B12: Normal A1c: 6.5  DIAGNOSTIC Ankle Brachial Index: Negative for peripheral arterial disease (08/05/2024)  Assessment & Plan:   Assessment and Plan Assessment & Plan Suspected inflammatory myopathy with muscle weakness and cramps (rheumatology referral) Muscle weakness and cramps primarily in the upper thighs and lower legs, with some improvement. Elevated creatinine kinase levels, not dangerously high. Prednisone  has provided some relief. Differential includes inflammatory myopathies such as dermatomyositis. Rheumatology referral is in place for further evaluation. - Continue prednisone  as prescribed. - Await rheumatology consultation on January 19th. - Will repeat creatinine kinase and additional blood tests to further evaluate muscle issues.  Peripheral neuropathy and restless legs syndrome Peripheral neuropathy symptoms have improved, possibly due to prednisone . Restless legs syndrome symptoms persist but are slightly better. Negative ankle-brachial index rules out peripheral arterial disease. Consideration of vascular ultrasound to rule out venous issues, but decision to wait for rheumatology evaluation. - Continue gabapentin  for restless legs syndrome. - Await rheumatology evaluation before considering vascular ultrasound.  Type 2 diabetes mellitus A1c is 6.5, indicating diabetes. Previous A1c was 5.4 six years ago. Symptoms of  peripheral neuropathy may be related to diabetes. Current medications include Farxiga , which also aids in diabetes management. Plan to focus on dietary changes to manage diabetes before considering medication adjustments. - Focus on dietary changes: reduce sodas, sweets, and high-carb foods; increase whole wheat and multigrain options. - Will re-evaluate A1c in three months after dietary changes. - Continue Farxiga  for diabetes management.  Essential hypertension Blood pressure is generally well-controlled at home, with occasional elevations. Current medications include metoprolol  and sacubitril -valsartan . Blood pressure target is less than 140/90 due to diabetes. - Continue current antihypertensive medications. - Monitor blood pressure regularly at home.  Atrial fibrillation and heart failure Managed with current medications including Eliquis ,  sacubitril -valsartan , and spironolactone . - Continue current medications for atrial fibrillation and heart failure. -stable -cont f/u with cardiology as scheduled  Hyperlipidemia Managed with atorvastatin  and ezetimibe . - Continue current lipid-lowering medications.  Prurigo nodularis and diffuse skin lesions Previous referral to dermatology.  General Health Maintenance Discussed the importance of dietary changes to manage diabetes and overall health. Emphasized the role of whole grains and reducing high glycemic index foods. - Implement dietary changes focusing on whole grains and reducing high glycemic index foods.        Return in about 3 months (around 11/15/2024) for f/u diabetes, f/u blood pressure.     Ginger Patrick, MSN, APRN, FNP-C [facility]

## 2024-08-25 ENCOUNTER — Telehealth: Payer: Self-pay

## 2024-08-25 DIAGNOSIS — Z122 Encounter for screening for malignant neoplasm of respiratory organs: Secondary | ICD-10-CM

## 2024-08-25 DIAGNOSIS — Z87891 Personal history of nicotine dependence: Secondary | ICD-10-CM

## 2024-08-25 LAB — EXTENDED MYOSITIS SPECIFIC ANTIBODY (MSA) PANEL
CYTOSOLIC 5' NUCLEOTIDASE 1A (cN 1A) AB (IGG): <5 U
EJ AB: <11 SI (ref <11)
HMGCR AB (IGG): <2 CU (ref <20)
JO-1 AB: <11 SI (ref <11)
MDA-5 AB: <11 SI (ref <11)
MI-2 ALPHA AB: <11 SI (ref <11)
MI-2 BETA AB: <11 SI (ref <11)
NXP-2 AB: <11 SI (ref <11)
OJ AB: <11 SI (ref <11)
PL-12 AB: <11 SI (ref <11)
PL-7 AB: <11 SI (ref <11)
SRP-AB: <11 SI (ref <11)
TIF-1y AB: <11 SI (ref <11)

## 2024-08-25 LAB — ALDOLASE: Aldolase: 4.7 U/L (ref <=8.1)

## 2024-08-25 NOTE — Telephone Encounter (Signed)
 Lung Cancer Screening Narrative/Criteria Questionnaire (Cigarette Smokers Only- No Cigars/Pipes/vapes)   Emil JAYSON Pinal   SDMV:09/14/2024 at 4:00 pm  [place]  11-21-1955               LDCT: 09/22/2024 at 8:00 am GI   68 y.o.   Phone: [phone]  Lung Screening Narrative (confirm age 42-77 yrs Medicare / 50-80 yrs Private pay insurance)   Insurance information: [employer]   Referring Provider: Corwin PIETY   This screening involves an initial phone call with a team member from our program. It is called a shared decision making visit. The initial meeting is required by  insurance and Medicare to make sure you understand the program. This appointment takes about 15-20 minutes to complete. You will complete the screening scan at your scheduled date/time.  This scan takes about 5-10 minutes to complete. You can eat and drink normally before and after the scan.  Criteria questions for Lung Cancer Screening:   Are you a current or former smoker? Former Age began smoking: 17   If you are a former smoker, what year did you quit smoking? Quit 6 years ago  (within 15 yrs)   To calculate your smoking history, I need an accurate estimate of how many packs of cigarettes you smoked per day and for how many years. (Not just the number of PPD you are now smoking)   Years smoking 45 x Packs per day 1 = Pack years 45   (at least 20 pack yrs)   (Make sure they understand that we need to know how much they have smoked in the past, not just the number of PPD they are smoking now)  Do you have a personal history of cancer?  Yes - (type and when diagnosed - 5 yrs cancer free) Lung Cancer     Do you have a family history of cancer? Yes  (cancer type and and relative) Father had colon cancer.   Are you coughing up blood?  No  Have you had unexplained weight loss of 15 lbs or more in the last 6 months? No  It looks like you meet all criteria.  When would be a good time for us  to schedule you for this  screening?   Additional information: N/A

## 2024-09-07 ENCOUNTER — Other Ambulatory Visit (HOSPITAL_BASED_OUTPATIENT_CLINIC_OR_DEPARTMENT_OTHER)

## 2024-09-14 ENCOUNTER — Other Ambulatory Visit: Payer: Self-pay | Admitting: General Practice

## 2024-09-14 ENCOUNTER — Ambulatory Visit: Admitting: Acute Care

## 2024-09-14 DIAGNOSIS — Z87891 Personal history of nicotine dependence: Secondary | ICD-10-CM | POA: Diagnosis not present

## 2024-09-14 DIAGNOSIS — Z122 Encounter for screening for malignant neoplasm of respiratory organs: Secondary | ICD-10-CM

## 2024-09-14 DIAGNOSIS — E785 Hyperlipidemia, unspecified: Secondary | ICD-10-CM

## 2024-09-14 NOTE — Progress Notes (Signed)
 Virtual Visit via Telephone Note  I connected with Curtis Henry on 09/14/2024 at  4:00 PM EST by telephone and verified that I am speaking with the correct person using two identifiers.  Location: Patient: At home, in [place]  Provider: [address] [place], [place], [address]    I discussed the limitations, risks, security and privacy concerns of performing an evaluation and management service by telephone and the availability of in person appointments. I also discussed with the patient that there may be a patient responsible charge related to this service. The patient expressed understanding and agreed to proceed.  Shared Decision Making Visit Lung Cancer Screening Program [phone])   Eligibility: Age 68 y.o. Pack Years Smoking History Calculation 45 pack years  (# packs/per year x # years smoked) Recent History of coughing up blood  no Unexplained weight loss? no ( >Than 15 pounds within the last 6 months ) Prior History Lung / other cancer yes (Diagnosis within the last 5 years already requiring surveillance chest CT Scans). S/P LLL lobectomy in 2019, completed surveillance and returning to screening program.  Smoking Status Former Smoker Former Smokers: Years since quit: 6 years  Quit Date: 2019  Visit Components: Discussion included one or more decision making aids. yes Discussion included risk/benefits of screening. yes Discussion included potential follow up diagnostic testing for abnormal scans. yes Discussion included meaning and risk of over diagnosis. yes Discussion included meaning and risk of False Positives. yes Discussion included meaning of total radiation exposure. yes  Counseling Included: Importance of adherence to annual lung cancer LDCT screening. yes Impact of comorbidities on ability to participate in the program. yes Ability and willingness to under diagnostic treatment. yes  Smoking Cessation Counseling: Current Smokers:  Discussed importance of  smoking cessation. N/A Information about tobacco cessation classes and interventions provided to patient. N/A Patient provided with ticket for LDCT Scan. N/A Symptomatic Patient. N/A  Counseling Diagnosis Code: Tobacco Use Z72.0 Asymptomatic Patient N/A  Counseling  Former Smokers:  Discussed the importance of maintaining cigarette abstinence. yes Diagnosis Code: Personal History of Nicotine  Dependence. S12.108 Information about tobacco cessation classes and interventions provided to patient. Yes Patient provided with ticket for LDCT Scan. N/A Written Order for Lung Cancer Screening with LDCT placed in Epic. Yes (CT Chest Lung Cancer Screening Low Dose W/O CM) [id number] Z12.2-Screening of respiratory organs Z87.891-Personal history of nicotine  dependence   Shared decision visit completed by Curtis Georgia, Curtis Henry as a registered nurse awaiting credentialing.    Curtis CHRISTELLA Georgia, Curtis Henry

## 2024-09-14 NOTE — Patient Instructions (Addendum)
 Thank you for participating in the [facility] Screening Program. It was our pleasure to meet you today. We will call you with the results of your scan within the next few days. Your scan will be assigned a Lung RADS category score by the physicians reading the scans.  This Lung RADS score determines follow up scanning.  See below for description of categories, and follow up screening recommendations. We will be in touch to schedule your follow up screening annually or based on recommendations of our providers. We will fax a copy of your scan results to your Primary Care Physician, or the physician who referred you to the program, to ensure they have the results. Please call the office if you have any questions or concerns regarding your scanning experience or results.  Our office number is [phone]. Please speak with Karna Curly, RN., Karna Doom RN, or [facility] RN, and Isaiah Dover RN. They are  our Lung Cancer Screening RN.'s If They are unavailable when you call, Please leave a message on the voice mail. We will return your call at our earliest convenience.This voice mail is monitored several times a day.  Remember, if your scan is normal, we will scan you annually as long as you continue to meet the criteria for the program. (Age 21-80, Current smoker or smoker who has quit within the last 15 years). If you are a smoker, remember, quitting is the single most powerful action that you can take to decrease your risk of lung cancer and other pulmonary, breathing related problems. We know quitting is hard, and we are here to help.  Please let us  know if there is anything we can do to help you meet your goal of quitting. If you are a former smoker, counselling psychologist. We are proud of you! Remain smoke free! Remember you can refer friends or family members through the number above.  We will screen them to make sure they meet criteria for the program. Thank you for helping us   take better care of you by participating in Lung Screening.  For Virtual Smoking Cessation Classes , The American Lung Association Provides  Freedom From Smoking Classes.  Please search their website for dates and times.    Lung RADS Categories:  Lung RADS 1: no nodules or definitely non-concerning nodules.  Recommendation is for a repeat annual scan in 12 months.  Lung RADS 2:  nodules that are non-concerning in appearance and behavior with a very low likelihood of becoming an active cancer. Recommendation is for a repeat annual scan in 12 months.  Lung RADS 3: nodules that are probably non-concerning , includes nodules with a low likelihood of becoming an active cancer.  Recommendation is for a 33-month repeat screening scan. Often noted after an upper respiratory illness. We will be in touch to make sure you have no questions, and to schedule your 47-month scan.  Lung RADS 4 A: nodules with concerning findings, recommendation is most often for a follow up scan in 3 months or additional testing based on our provider's assessment of the scan. We will be in touch to make sure you have no questions and to schedule the recommended 3 month follow up scan.  Lung RADS 4 B:  indicates findings that are concerning. We will be in touch with you to schedule additional diagnostic testing based on our provider's  assessment of the scan.  You can receive free nicotine  replacement therapy ( patches, gum or mints) by calling 1-800-QUIT NOW.  Please call so we can get you on the path to becoming  a non-smoker. I know it is hard, but you can do this!  Other options for assistance in smoking cessation ( As covered by your insurance benefits)  Hypnosis for smoking cessation  [employer] [phone]  Acupuncture for smoking cessation  [facility] [phone]    Your CT scan is scheduled for 09/22/2024 at 8 am at [facility] [place])

## 2024-09-15 NOTE — Progress Notes (Addendum)
 " Cardiology Office Note   Date: 09/18/2024  ID:  Curtis Henry 02-Jul-1956 [id number] PCP: Corwin Antu, FNP  [facility] Providers Cardiologist: Lonni LITTIE Nanas, MD     Chief Complaint: Curtis Henry is a 68 y.o.male with PMH of HFmrEF with LVEF 40-45% on echo 12/21/2022 but 55% on cardiac MRI 05/06/2023, AF/AFL on Eliquis , non-obstructive CAD on LHC 01/17/2023, hypertension, hyperlipidemia, squamous cell lung cancer who presents to the clinic for routine follow-up.     [name] had thoracotomy with left lower lobe lobectomy for squamous cell lung cancer in 2019.  Developed AFL during hospitalization which was successfully treated with amiodarone .  This was discontinued after discharge given no recurrence.  Referred back to cardiology 12/2022 for evaluation of chest pain for 2 to 3 months. Zio showed one 5-beat run of NSVT, 2% AFL burden, and 7.7% PACs.  Eliquis  and Toprol -XL were restarted.  Echo 12/21/2022 LVEF 40-45%, global hypokinesis, mildly reduced RV function.  Right and left cardiac catheterization was 01/17/2023 showed very mild nonobstructive CAD and normal right heart pressures.  Noted significant supraventricular ectopy during procedure.  Referred to HF PharmD for GDMT titration as well as for a cardiac MRI to evaluate etiology of NICM.  Cardiac MRI 05/06/2023 with LVEF 55% without myocardial infarct.  He was continued on Entresto , Farxiga , metoprolol .  Further titration limited by BP.  Echo 09/24/2023 LVEF 45-50%, mild to moderate MR with mild holosystolic prolapse of the middle segment of the anterior leaflet of the MV. Recommended to follow this by echo annually. Seen by Dr. Nanas 01/29/2023 and was doing well.  Short-term monitor with significant improvement from the past showing predominantly NSR with average HR 68 bpm, three runs of SVT with the longest lasting 6 beats.  Seen again 05/27/2024 with chest pressure every day for 2 to 3 weeks that worsened when he stretched.   This was attributed to a musculoskeletal etiology and ischemic evaluation was deferred.  He was hypertensive and spironolactone  12.5 mg daily was added.  LDL also above goal and Zetia  was added.  Repeat echo currently scheduled for 10/09/2024.  Reported bilateral burning of his LEs to his PCP.  ABIs negative 08/05/2024.    History of Present Illness: Today he feels great, has been actively working on his diet. Eating more fruits and vegetables but does note that he still enjoys eating chips and peanuts occasionally. Taking medications as prescribed. Dyspnea when he walks up stairs chronically. Not checking weights at home. BP at home 130s/70s, has had his cuff checked for accuracy in the office. Took his medications about an hour ago, has not eaten breakfast yet.   ROS: Denies chest pain, orthopnea, PND, lower extremity edema, palpitations, lightheadedness, dizziness, presyncope/syncope, UTIs/yeast infections, abnormal bleeding.   Studies Reviewed: The following studies were personally reviewed today: EKG Interpretation Date/Time:  Friday September 18 2024 08:15:55 EST Ventricular Rate:  59 PR Interval:  142 QRS Duration:  78 QT Interval:  416 QTC Calculation: 411 R Axis:   79  Text Interpretation: Sinus bradycardia Confirmed by Nola Numbers ([id number]) on 09/18/2024 8:18:23 AM   Cardiac Studies & Procedures   ______________________________________________________________________________________________ CARDIAC CATHETERIZATION  CARDIAC CATHETERIZATION 01/17/2023  Conclusion   Ost LM lesion is 20% stenosed.   Ramus lesion is 30% stenosed.  Very mild nonobstructive CAD with smooth ostial 20% narrowing of the left main, and 30% very proximal narrowing in a small ramus intermediate vessel.  Normal large LAD, dominant RCA, and small left circumflex vessel.  Low normal right heart pressures.  Mean PA pressure 16 mmHg.  PVR: 2.8 WU  RECOMMENDATION: Findings are compatible with nonischemic  cardiomyopathy.  During the procedure the patient was going in and out of paroxysmal SVT.  Recommend resumption of Eliquis  tomorrow.  Medical therapy.  Findings Coronary Findings Diagnostic  Dominance: Right  Left Main Ost LM lesion is 20% stenosed.  Ramus Intermedius Vessel is small. Ramus lesion is 30% stenosed.  Left Circumflex  First Obtuse Marginal Branch Vessel is small in size.  Intervention  No interventions have been documented.     ECHOCARDIOGRAM  ECHOCARDIOGRAM COMPLETE 09/24/2023  Narrative ECHOCARDIOGRAM REPORT    Patient Name:   Curtis Henry Date of Exam: 09/24/2023 Medical Rec #:  [id number]      Height:       72.0 in Accession #:    [id number]     Weight:       129.2 lb Date of Birth:  1956-07-08       BSA:          1.769 m Patient Age:    28 years       BP:           118/70 mmHg Patient Gender: M              HR:           72 bpm. Exam Location:  [address]  Procedure: 2D Echo, Cardiac Doppler, Color Doppler, 3D Echo and Strain Analysis  Indications:    I50.22 CHF  History:        Patient has prior history of Echocardiogram examinations, most recent 12/21/2022. CHF, Arrythmias:Atrial Flutter; Risk Factors:Hypertension and Dyslipidemia.  Sonographer:    Elsie Bohr RDCS Referring Phys: [id number] [name]  IMPRESSIONS   1. Left ventricular ejection fraction, by estimation, is 45 to 50%. Left ventricular ejection fraction by 3D volume is 50 %. The left ventricle has mildly decreased function. The left ventricle demonstrates global hypokinesis. Left ventricular diastolic parameters were normal. The average left ventricular global longitudinal strain is 16.6 %. The global longitudinal strain is abnormal. 2. Right ventricular systolic function is normal. The right ventricular size is normal. 3. The mitral valve is normal in structure. Mild to moderate mitral valve regurgitation. No evidence of mitral stenosis. There is mild holosystolic  prolapse of the middle segment of the anterior leaflet of the mitral valve. 4. The aortic valve is normal in structure. Aortic valve regurgitation is not visualized. No aortic stenosis is present. 5. The inferior vena cava is normal in size with greater than 50% respiratory variability, suggesting right atrial pressure of 3 mmHg.  Comparison(s): Prior images reviewed side by side. Prior EF 40-45% 12/2022.  FINDINGS Left Ventricle: Left ventricular ejection fraction, by estimation, is 45 to 50%. Left ventricular ejection fraction by 3D volume is 50 %. The left ventricle has mildly decreased function. The left ventricle demonstrates global hypokinesis. The average left ventricular global longitudinal strain is 16.6 %. The global longitudinal strain is abnormal. The left ventricular internal cavity size was normal in size. There is no left ventricular hypertrophy. Left ventricular diastolic parameters were normal.  Right Ventricle: The right ventricular size is normal. No increase in right ventricular wall thickness. Right ventricular systolic function is normal.  Left Atrium: Left atrial size was normal in size.  Right Atrium: Right atrial size was normal in size.  Pericardium: There is no evidence of pericardial effusion.  Mitral Valve: The mitral valve is  normal in structure. There is mild holosystolic prolapse of the middle segment of the anterior leaflet of the mitral valve. Mild to moderate mitral valve regurgitation. No evidence of mitral valve stenosis.  Tricuspid Valve: The tricuspid valve is normal in structure. Tricuspid valve regurgitation is trivial. No evidence of tricuspid stenosis.  Aortic Valve: The aortic valve is normal in structure. Aortic valve regurgitation is not visualized. No aortic stenosis is present.  Pulmonic Valve: The pulmonic valve was normal in structure. Pulmonic valve regurgitation is not visualized. No evidence of pulmonic stenosis.  Aorta: The aortic root is  normal in size and structure.  Venous: The inferior vena cava is normal in size with greater than 50% respiratory variability, suggesting right atrial pressure of 3 mmHg.  IAS/Shunts: No atrial level shunt detected by color flow Doppler.   LEFT VENTRICLE PLAX 2D LVIDd:         4.05 cm         Diastology LVIDs:         2.65 cm         LV e' medial:    11.00 cm/s LV PW:         1.10 cm         LV E/e' medial:  6.6 LV IVS:        0.90 cm         LV e' lateral:   12.40 cm/s LVOT diam:     2.10 cm         LV E/e' lateral: 5.9 LV SV:         64 LV SV Index:   36              2D LVOT Area:     3.46 cm        Longitudinal Strain 2D Strain GLS  19.4 % (A2C): 2D Strain GLS  15.3 % (A3C): 2D Strain GLS  15.0 % (A4C): 2D Strain GLS  16.6 % Avg:  3D Volume EF LV 3D EF:    Left ventricul ar ejection fraction by 3D volume is 50 %.  3D Volume EF: 3D EF:        50 % LV EDV:       121 ml LV ESV:       60 ml LV SV:        61 ml  RIGHT VENTRICLE             IVC RV S prime:     10.90 cm/s  IVC diam: 0.60 cm TAPSE (M-mode): 1.4 cm  LEFT ATRIUM             Index        RIGHT ATRIUM           Index LA diam:        2.70 cm 1.53 cm/m   RA Pressure: 3.00 mmHg LA Vol (A2C):   42.6 ml 24.07 ml/m  RA Area:     10.40 cm LA Vol (A4C):   37.3 ml 21.08 ml/m  RA Volume:   22.50 ml  12.72 ml/m LA Biplane Vol: 39.7 ml 22.44 ml/m AORTIC VALVE LVOT Vmax:   91.80 cm/s LVOT Vmean:  57.400 cm/s LVOT VTI:    0.185 m  AORTA Ao Root diam: 3.30 cm Ao Asc diam:  3.30 cm  MITRAL VALVE               TRICUSPID VALVE MV Area (PHT): 2.79 cm  Estimated RAP:  3.00 mmHg MV Decel Time: 272 msec MV E velocity: 72.80 cm/s  SHUNTS MV A velocity: 41.90 cm/s  Systemic VTI:  0.18 m MV E/A ratio:  1.74        Systemic Diam: 2.10 cm  Oneil Parchment MD Electronically signed by Oneil Parchment MD Signature Date/Time: 09/24/2023/10:43:55 AM    Final    MONITORS  LONG TERM MONITOR (3-14 DAYS)  02/14/2024  Narrative   No significant arrhythmias   Patch Wear Time:  9 days and 6 hours (2025-05-14T10:07:19-0400 to 2025-05-23T16:38:02-399)  Patient had a min HR of 38 bpm, max HR of 158 bpm, and avg HR of 68 bpm. Predominant underlying rhythm was Sinus Rhythm. 3 Supraventricular Tachycardia runs occurred, the run with the fastest interval lasting 6 beats with a max rate of 158 bpm (avg 123 bpm); the run with the fastest interval was also the longest. Some episodes of Supraventricular Tachycardia may be possible Atrial Tachycardia with variable block. Isolated SVEs were rare (<1.0%), SVE Couplets were rare (<1.0%), and SVE Triplets were rare (<1.0%). Isolated VEs were rare (<1.0%), VE Couplets were rare (<1.0%), and no VE Triplets were present. Ventricular Bigeminy was present. Inverted QRS complexes possibly due to inverted placement of device.     CARDIAC MRI  MR CARDIAC MORPHOLOGY W WO CONTRAST 05/06/2023  Narrative CLINICAL DATA:  89M with HFrEF (40-45% on echo). Nonobstructive CAD on cath  EXAM: CARDIAC MRI  TECHNIQUE: The patient was scanned on a 1.5 Tesla Siemens magnet. A dedicated cardiac coil was used. Functional imaging was done using Fiesta sequences. 2,3, and 4 chamber views were done to assess for RWMA's. Modified Simpson's rule using a short axis stack was used to calculate an ejection fraction on a dedicated work [employer]. The patient received 9 cc of Gadavist . After 10 minutes inversion recovery sequences were used to assess for infiltration and scar tissue. Phase contrast velocity mapping was performed above the aortic and pulmonic valves  CONTRAST:  9 cc  of Gadavist   FINDINGS: Left ventricle:  -Normal size  -Normal wall thickness  -Normal systolic function  -Mild ECV elevation (31%)  -No LGE  LV EF: 55% (Normal 49-79%)  Absolute volumes:  LV EDV: (Normal 95-215 mL)  LV ESV: 64mL (Normal 25-85 mL)  LV SV: 80mL  (Normal 61-145 mL)  CO: 3.6L/min (Normal 3.4-7.8 L/min)  Indexed volumes:  LV EDV: 21mL/sq-m (Normal 50-108 mL/sq-m)  LV ESV: 46mL/sq-m (Normal 11-47 mL/sq-m)  LV SV: 72mL/sq-m (Normal 33-72 mL/sq-m)  CI: 2.1L/min/sq-m (Normal 1.8-4.2 L/min/sq-m)  Right ventricle: Normal size and systolic function  RV EF:  51% (Normal 51-80%)  Absolute volumes:  RV EDV: (Normal 109-217 mL)  RV ESV: 74mL (Normal 23-91 mL)  RV SV: 77mL (Normal 71-141 mL)  CO: 3.4L/min (Normal 2.8-8.8 L/min)  Indexed volumes:  RV EDV: 54mL/sq-m (Normal 58-109 mL/sq-m)  RV ESV: 33mL/sq-m (Normal 12-46 mL/sq-m)  RV SV: 85mL/sq-m (Normal 38-71 mL/sq-m)  CI: 2.0L/min/sq-m (Normal 1.7-4.2 L/min/sq-m)  Left atrium: Mild enlargement  Right atrium: Normal size  Mitral valve: Mild regurgitation  Aortic valve: Trivial regurgitation  Tricuspid valve: Mild regurgitation  Pulmonic valve: Trivial regurgitation  Aorta: Normal proximal ascending aorta  Pericardium: Normal  IMPRESSION: 1.  Normal LV size, wall thickness, and systolic function (EF 55%)  2.  Normal RV size and systolic function (EF 51%)  3.  No late gadolinium enhancement to suggest myocardial scar   Electronically Signed By: Lonni Nanas M.D. On: 05/12/2023 13:31  ______________________________________________________________________________________________       Risk Assessment/Calculations:   CHA2DS2-VASc Score = 4   This indicates a 4.8% annual risk of stroke. The patient's score is based upon: CHF History: 1 HTN History: 1 Diabetes History: 1 Stroke History: 0 Vascular Disease History: 0 Age Score: 1 Gender Score: 0     HYPERTENSION CONTROL Vitals:   09/18/24 0810 09/18/24 0830  BP: (!) 162/84 (!) 166/88    The patient's blood pressure is elevated above target today.  In order to address the patient's elevated BP: Labs and/or other diagnostics are currently pending prior to making blood  pressure medication adjustments.             Physical Exam: VS: BP (!) 166/88 (BP Location: Left Arm, Patient Position: Sitting, Cuff Size: Normal)   Pulse (!) 59   Ht 6' (1.829 m)   Wt 129 lb 3.2 oz (58.6 kg)   SpO2 99%   BMI 17.52 kg/m   GEN: Well nourished, in NAD HEENT: Normal NECK: No JVD CARDIAC: Bradycardic, RRR, no murmurs, rubs, gallops RESPIRATORY: Diminished bilaterally, no crackles ABDOMEN: Soft, non-tender, non-distended MUSCULOSKELETAL: No edema SKIN: Warm and dry NEUROLOGIC:  Alert and oriented x 3 PSYCHIATRIC:  Pleasant, normal affect   Assessment & Plan: 1. Hypertension: BP today 162/84, 166/88 on my recheck. Took his medications about an hour ago. States that his BP is lower on home readings, 130s/70s. His cuff has been checked for accuracy.  - Check BMP today - He will check his BP at home and we will contact him next week to verify results  - Continue Toprol -XL 75 mg daily - can consider switching to carvedilol  in the future - Continue Entresto  24-26 mg twice daily - Continue spironolactone  12.5 mg daily - if potassium and renal function normal, consider increasing to 25 mg daily ADDENDUM 09/19/24: SCr and GFR worsened - discontinue spironolactone , switch from Toprol -XL to Coreg  25 mg BID, recheck BMET 1-2 weeks  2. HFmrEF/NICM: Most recent echo 09/24/2023 LVEF 45-50%.  Noted LVEF 55% on cardiac MRI 05/06/2023.  NYHA class II symptoms chronically. Appears euvolemic on exam.  - Has repeat echocardiogram scheduled for 10/09/2024 - Continue Farxiga  10 mg daily - Continue Toprol -XL 75 mg daily - Continue Entresto  24-26 mg twice daily - Continue spironolactone  12.5 mg daily  3. Bradycardia/History of AF/AFL: EKG today shows SB 59 bpm. After review of past office visits and EKGs, he has had bradycardia in the past. He is asymptomatic with this. CHA2DS2-VASc=4. He denies abnormal bleeding. - Continue to monitor for bradycardic symptoms on current dose of Toprol -XL -  Continue Eliquis  5 mg twice daily  4. Hyperlipidemia, goal LDL < 70: 05/27/2024 LDL 103, HDL 62, TGs 80, total 180.  01/29/2024 AST 22, ALT 22. Has been working to improve his diet and is taking medications as prescribed.  - Check fasting lipid panel today - Continue atorvastatin  80 mg daily and Zetia  10 mg daily  5. Nonobstructive CAD: Noted on [facility] 01/17/2023. Has chronic dyspnea with walking up a hill or stairs. Denies anginal symptoms today.  - Continue atorvastatin  80 mg daily and ezetimibe  10 mg daily - Continue Toprol -XL 75 mg daily    Dispo: Follow-up with Dr. Kate in 3 months or sooner if needed.  Signed, Saddie GORMAN Cleaves, NP 09/18/2024 9:05 AM [facility] "

## 2024-09-18 ENCOUNTER — Ambulatory Visit (HOSPITAL_BASED_OUTPATIENT_CLINIC_OR_DEPARTMENT_OTHER)

## 2024-09-18 ENCOUNTER — Ambulatory Visit

## 2024-09-18 ENCOUNTER — Encounter (HOSPITAL_BASED_OUTPATIENT_CLINIC_OR_DEPARTMENT_OTHER): Payer: Self-pay

## 2024-09-18 VITALS — BP 166/88 | HR 59 | Ht 72.0 in | Wt 129.2 lb

## 2024-09-18 DIAGNOSIS — I10 Essential (primary) hypertension: Secondary | ICD-10-CM | POA: Diagnosis not present

## 2024-09-18 DIAGNOSIS — I428 Other cardiomyopathies: Secondary | ICD-10-CM

## 2024-09-18 DIAGNOSIS — I480 Paroxysmal atrial fibrillation: Secondary | ICD-10-CM

## 2024-09-18 DIAGNOSIS — E782 Mixed hyperlipidemia: Secondary | ICD-10-CM | POA: Diagnosis not present

## 2024-09-18 DIAGNOSIS — I2510 Atherosclerotic heart disease of native coronary artery without angina pectoris: Secondary | ICD-10-CM

## 2024-09-18 LAB — BASIC METABOLIC PANEL WITH GFR
BUN/Creatinine Ratio: 10 (ref 10–24)
BUN: 13 mg/dL (ref 8–27)
CO2: 26 mmol/L (ref 20–29)
Calcium: 9.9 mg/dL (ref 8.6–10.2)
Chloride: 98 mmol/L (ref 96–106)
Creatinine, Ser: 1.31 mg/dL — ABNORMAL HIGH (ref 0.76–1.27)
Glucose: 47 mg/dL — ABNORMAL LOW (ref 70–99)
Potassium: 4.8 mmol/L (ref 3.5–5.2)
Sodium: 140 mmol/L (ref 134–144)
eGFR: 59 mL/min/1.73 — ABNORMAL LOW

## 2024-09-18 LAB — LIPID PANEL
Chol/HDL Ratio: 2.4 ratio (ref 0.0–5.0)
Cholesterol, Total: 152 mg/dL (ref 100–199)
HDL: 64 mg/dL
LDL Chol Calc (NIH): 72 mg/dL (ref 0–99)
Triglycerides: 87 mg/dL (ref 0–149)
VLDL Cholesterol Cal: 16 mg/dL (ref 5–40)

## 2024-09-18 NOTE — Patient Instructions (Addendum)
 Medication Instructions:  Continue your current medications.  Based on your labs and home blood pressures, we may consider adjusting your medication regimen.   *If you need a refill on your cardiac medications before your next appointment, please call your pharmacy*  Lab Work: Your physician recommends that you return for lab work today: BMET, lipid panel  If you have labs (blood work) drawn today and your tests are completely normal, you will receive your results only by: MyChart Message (if you have MyChart) OR A paper copy in the mail If you have any lab test that is abnormal or we need to change your treatment, we will call you to review the results.  Testing/Procedures: Your EKG today looked good! Proceed with echocardiogram in January as scheduled.   Follow-Up: At [facility], you and your health needs are our priority.  As part of our continuing mission to provide you with exceptional heart care, our providers are all part of one team.  This team includes your primary Cardiologist (physician) and Advanced Practice Providers or APPs (Physician Assistants and Nurse Practitioners) who all work together to provide you with the care you need, when you need it.  Your next appointment:   3 month(s)  Provider:   Dr. Kate at [facility] location    Other Instructions  Tips to Measure your Blood Pressure Correctly  Here's what you can do to ensure a correct reading:  Don't drink a caffeinated beverage or smoke during the 30 minutes before the test.  Sit quietly for five minutes before the test begins.  During the measurement, sit in a chair with your feet on the floor and your arm supported so your elbow is at about heart level.  The inflatable part of the cuff should completely cover at least 80% of your upper arm, and the cuff should be placed on bare skin, not over a shirt.  Don't talk during the measurement.  Have your blood pressure measured twice, with a brief  break in between. If the readings are different by 5 points or more, have it done a third time.  Blood pressure categories  Blood pressure category SYSTOLIC (upper number)  DIASTOLIC (lower number)  Normal Less than 120 mm Hg and Less than 80 mm Hg  Elevated 120-129 mm Hg and Less than 80 mm Hg  High blood pressure: Stage 1 hypertension 130-139 mm Hg or 80-89 mm Hg  High blood pressure: Stage 2 hypertension 140 mm Hg or higher or 90 mm Hg or higher  Hypertensive crisis (consult your doctor immediately) Higher than 180 mm Hg and/or Higher than 120 mm Hg  Source: American Heart Association and American Stroke Association. For more on getting your blood pressure under control, buy Controlling Your Blood Pressure, a Special Health Report from [facility].   Blood Pressure Log   Date   Time  Blood Pressure  Position  Example: Nov 1 9 AM 124/78 sitting

## 2024-09-19 ENCOUNTER — Ambulatory Visit: Payer: Self-pay

## 2024-09-19 DIAGNOSIS — I10 Essential (primary) hypertension: Secondary | ICD-10-CM

## 2024-09-19 DIAGNOSIS — Z79899 Other long term (current) drug therapy: Secondary | ICD-10-CM

## 2024-09-19 DIAGNOSIS — Z5181 Encounter for therapeutic drug level monitoring: Secondary | ICD-10-CM

## 2024-09-19 DIAGNOSIS — R7989 Other specified abnormal findings of blood chemistry: Secondary | ICD-10-CM

## 2024-09-21 MED ORDER — CARVEDILOL 25 MG PO TABS: 25.0000 mg | ORAL_TABLET | Freq: Two times a day (BID) | ORAL | 1 refills | Status: AC

## 2024-09-21 NOTE — Telephone Encounter (Signed)
 The patient has been notified of the result and verbalized understanding.  All questions (if any) were answered.  Pt states his BP's have been up and down since the office visit.  He only has 2 readings to provide us , one reading from 1/4 1 hour after meds (132/77) and one reading from this morning 1/5 before his meds (158/90).  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  He works during the day Mon-Fri.  Pt aware we will STOP his Toprol  XL and spironolactone  12.5 mg and start him on coreg  25 mg po bid.  He is aware to come in for repeat BMET on 10/05/24, for monitoring of renal function.   Confirmed the pharmacy of choice with the pt.   Advised the pt that we need a couple days worth of BP readings 1-2 hours after he takes his medication. He is aware when he takes this, he should be resting for at least 5-10 mins, then check the pressure.  Pt states he can take his BP cuff with him to work for a few days and record this, and then update us  at the end of the week with his readings. Will make a note about this to touch base with him at that time.  Informed the pt that I will make Saddie Cleaves, NP aware of this plan.  Pt verbalized understanding and agrees with this plan.  He states he will start the coreg  tomorrow and track his pressures after med administration for the next couple of days.

## 2024-09-21 NOTE — Telephone Encounter (Signed)
-----   Message from Saddie Cleaves, NP sent at 09/19/2024  2:30 PM EST ----- Curtis Eans! Could you please call Curtis Henry and let him know the following? - Your labs resulted and your kidney function is slightly worse. I would like to STOP the spironolactone  12.5 mg and Toprol -XL 75 mg daily and START Coreg  25 mg BID. We will repeat your labs in 1-2  weeks. Also, how is your BP at home? Please monitor it 1-2 hours after taking your medications.  Please order: - d/c spiro and Toprol  - start carvedilol  25 mg BID - BMET 1-2 weeks  Thank you!!

## 2024-09-22 ENCOUNTER — Ambulatory Visit
Admission: RE | Admit: 2024-09-22 | Discharge: 2024-09-22 | Disposition: A | Source: Ambulatory Visit | Attending: Acute Care | Admitting: Acute Care

## 2024-09-22 DIAGNOSIS — Z122 Encounter for screening for malignant neoplasm of respiratory organs: Secondary | ICD-10-CM

## 2024-09-22 DIAGNOSIS — Z87891 Personal history of nicotine dependence: Secondary | ICD-10-CM

## 2024-09-28 ENCOUNTER — Other Ambulatory Visit: Payer: Self-pay | Admitting: Acute Care

## 2024-09-28 DIAGNOSIS — Z122 Encounter for screening for malignant neoplasm of respiratory organs: Secondary | ICD-10-CM

## 2024-09-28 DIAGNOSIS — Z87891 Personal history of nicotine dependence: Secondary | ICD-10-CM

## 2024-09-29 NOTE — Progress Notes (Unsigned)
 "  Office Visit Note  Patient: Curtis Henry             Date of Birth: 1955-10-08           MRN: [id number]             PCP: Corwin Antu, FNP Referring: Corwin Antu, FNP Visit Date: 10/05/2024 Occupation: driver  Subjective:  No chief complaint on file.   History of Present Illness: Curtis Henry is a 69 y.o. male ***     Activities of Daily Living:  Patient reports morning stiffness for *** {minute/hour:19697}.   Patient {ACTIONS;DENIES/REPORTS:21021675::Denies} nocturnal pain.  Difficulty dressing/grooming: {ACTIONS;DENIES/REPORTS:21021675::Denies} Difficulty climbing stairs: {ACTIONS;DENIES/REPORTS:21021675::Denies} Difficulty getting out of chair: {ACTIONS;DENIES/REPORTS:21021675::Denies} Difficulty using hands for taps, buttons, cutlery, and/or writing: {ACTIONS;DENIES/REPORTS:21021675::Denies}  No Rheumatology ROS completed.   PMFS History:  Patient Active Problem List   Diagnosis Date Noted   History of lung cancer 07/30/2024   History of tobacco use 07/30/2024   History of atrial flutter 07/30/2024   Erectile dysfunction 07/30/2024   Wears dentures 07/30/2024   Skin lesions 07/30/2024   Peripheral neuropathy 07/30/2024   Sciatica of left side 08/24/2023   Chronic systolic heart failure (HCC) 04/02/2023   Orthostatic syncope 03/13/2023   Paroxysmal supraventricular tachycardia 11/28/2022   Tingling in extremities 09/15/2022   Restless legs syndrome 10/10/2021   Prediabetes 10/10/2021   Seasonal allergies 03/22/2020   Paroxysmal atrial fibrillation (HCC) 01/31/2018   HTN (hypertension) 01/31/2018   S/P lobectomy of lung 01/17/2018   COPD (chronic obstructive pulmonary disease) (HCC) 12/20/2017   Prurigo nodularis 01/16/2017   HYPERCHOLESTEROLEMIA 11/14/2006   GASTROESOPHAGEAL REFLUX, NO ESOPHAGITIS 11/14/2006    Past Medical History:  Diagnosis Date   Anxiety state 03/08/2018   Bursitis of left shoulder    Essential hypertension  01/31/2018   In 2019, has not been on medication since this time.   GERD (gastroesophageal reflux disease)    Hyperlipidemia    Restless leg syndrome    Squamous cell lung cancer (HCC)     Family History  Problem Relation Age of Onset   Esophageal cancer Brother    Colon cancer Father 13   Arthritis Other    Past Surgical History:  Procedure Laterality Date   CIRCUMCISION  12/13/2003   COLONOSCOPY     RIGHT/LEFT HEART CATH AND CORONARY ANGIOGRAPHY N/A 01/17/2023   Procedure: RIGHT/LEFT HEART CATH AND CORONARY ANGIOGRAPHY;  Surgeon: Burnard Debby LABOR, MD;  Location: [facility];  Service: Cardiovascular;  Laterality: N/A;   VIDEO ASSISTED THORACOSCOPY (VATS)/ LOBECTOMY Left 01/17/2018   Procedure: left VIDEO ASSISTED THORACOSCOPY (VATS) with left lower lobe wedge resection and LOBECTOMY;  Surgeon: Fleeta Hanford Coy, MD;  Location: [facility] OR;  Service: Thoracic;  Laterality: Left;   Social History[1] Social History   Social History Narrative   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      Immunization History  Administered Date(s) Administered   Fluad Quad(high Dose 65+) 10/30/2022   Fluad Trivalent(High Dose 65+) 08/23/2023   Hep A / Hep B 12/06/2023, 05/17/2024   INFLUENZA, HIGH DOSE SEASONAL PF 05/17/2024   PFIZER(Purple Top)SARS-COV-2 Vaccination 11/27/2019, 12/18/2019   Pfizer Covid-19 Vaccine Bivalent Booster 38yrs & up 10/10/2021   Pfizer(Comirnaty)Fall Seasonal Vaccine 12 years and older 10/30/2022   Pneumococcal Conjugate Pcv21, Polysaccharide Crm197 Conjugaf 12/06/2023   Td 11/16/2003   Tdap 12/12/2016   Zoster Recombinant(Shingrix) 04/02/2023, 07/14/2023     Objective: Vital Signs: There were no vitals taken for this visit.  Physical Exam   Musculoskeletal Exam: ***  CDAI Exam: CDAI Score: -- Patient Global: --; Provider Global: -- Swollen: --; Tender: -- Joint Exam 10/05/2024   No joint exam has been documented for this visit   There is currently no  information documented on the homunculus. Go to the Rheumatology activity and complete the homunculus joint exam.  Investigation: No additional findings.  Imaging: CT CHEST LUNG CA SCREEN LOW DOSE W/O CM Result Date: 09/28/2024 CLINICAL DATA:  69 year old male former smoker with 45 pack-year smoking history, quit smoking 6 years prior. History of left lower lobectomy for lung cancer in 2019. * Tracking Code: BO * EXAM: CT CHEST WITHOUT CONTRAST LOW-DOSE FOR LUNG CANCER SCREENING TECHNIQUE: Multidetector CT imaging of the chest was performed following the standard protocol without IV contrast. RADIATION DOSE REDUCTION: This exam was performed according to the departmental dose-optimization program which includes automated exposure control, adjustment of the mA and/or kV according to patient size and/or use of iterative reconstruction technique. COMPARISON:  05/03/2023 chest CT FINDINGS: Cardiovascular: Normal heart size. No significant pericardial effusion/thickening. Atherosclerotic nonaneurysmal thoracic aorta. Normal caliber pulmonary arteries. Mediastinum/Nodes: No significant thyroid nodules. Unremarkable esophagus. No axillary adenopathy. Mildly enlarged partially calcified 1.3 cm lower right paratracheal node on series 2/image 29 is stable and compatible with prior granulomatous disease. No pathologically enlarged noncalcified mediastinal or discrete hilar nodes on these noncontrast images. Lungs/Pleura: Status post left lower lobectomy. No pneumothorax. No pleural effusion. Mild-to-moderate centrilobular emphysema with diffuse bronchial wall thickening. No acute consolidative airspace disease or lung masses. Tiny 3 mm right upper lobe solid nodule on image 79 and 4.4 mm irregular superior segment right lower lobe nodule on image 185 are not appreciably changed from 05/03/2023 chest CT. Apical right upper lobe indistinct 4.8 mm solid nodule on image 63 is stable back to 12/10/2017 CT. No new significant  pulmonary nodules. Upper abdomen: No acute abnormality. Musculoskeletal: No aggressive appearing focal osseous lesions. Mild thoracic spondylosis. IMPRESSION: Lung-RADS 2, benign appearance or behavior. Continue annual screening with low-dose chest CT without contrast in 12 months. Aortic Atherosclerosis (ICD10-I70.0) and Emphysema (ICD10-J43.9). Electronically Signed   By: Selinda DELENA Blue M.D.   On: 09/28/2024 11:39    Recent Labs: Lab Results  Component Value Date   WBC 8.1 05/04/2024   HGB 16.4 05/04/2024   PLT 243 05/04/2024   NA 140 09/18/2024   K 4.8 09/18/2024   CL 98 09/18/2024   CO2 26 09/18/2024   GLUCOSE 47 (L) 09/18/2024   BUN 13 09/18/2024   CREATININE 1.31 (H) 09/18/2024   BILITOT 0.4 01/29/2024   ALKPHOS 42 (L) 01/29/2024   AST 22 01/29/2024   ALT 22 01/29/2024   PROT 7.5 01/29/2024   ALBUMIN 4.9 01/29/2024   CALCIUM  9.9 09/18/2024   GFRAA >60 02/23/2018    Speciality Comments: No specialty comments available.  Procedures:  No procedures performed Allergies: Lactose intolerance (gi)   Assessment / Plan:     Visit Diagnoses: No diagnosis found.  Orders: No orders of the defined types were placed in this encounter.  No orders of the defined types were placed in this encounter.   Face-to-face time spent with patient was *** minutes. Greater than 50% of time was spent in counseling and coordination of care.  Follow-Up Instructions: No follow-ups on file.   Alfonso Patterson, LPN  Note - This record has been created using [employer].  Chart creation errors have been sought, but may not always  have been located. Such creation  errors do not reflect on  the standard of medical care.    [1]  Social History Tobacco Use   Smoking status: Former    Current packs/day: 0.00    Average packs/day: 0.1 packs/day for 46.0 years (4.6 ttl pk-yrs)    Types: Cigarettes    Start date: 09/17/1974    Quit date: 2022    Years since quitting: 4.0   Smokeless tobacco:  Never  Vaping Use   Vaping status: Some Days   Substances: Nicotine , Flavoring  Substance Use Topics   Alcohol use: Not Currently   Drug use: Yes    Frequency: 7.0 times per week    Types: Marijuana    Comment: smokes marijuana; crack use distant past >30 yrs ago   "

## 2024-09-30 ENCOUNTER — Other Ambulatory Visit: Payer: Self-pay | Admitting: Family

## 2024-09-30 DIAGNOSIS — M6281 Muscle weakness (generalized): Secondary | ICD-10-CM

## 2024-09-30 DIAGNOSIS — R748 Abnormal levels of other serum enzymes: Secondary | ICD-10-CM

## 2024-10-03 ENCOUNTER — Other Ambulatory Visit: Payer: Self-pay | Admitting: Family Medicine

## 2024-10-03 DIAGNOSIS — I4892 Unspecified atrial flutter: Secondary | ICD-10-CM

## 2024-10-05 ENCOUNTER — Ambulatory Visit

## 2024-10-05 NOTE — Progress Notes (Unsigned)
 "  Office Visit Note  Patient: Curtis Henry             Date of Birth: June 04, 1956           MRN: [id number]             PCP: Corwin Antu, FNP Referring: Corwin Antu, FNP Visit Date: 10/08/2024 Occupation: driver  Subjective:  No chief complaint on file.   History of Present Illness: Curtis Henry is a 69 y.o. male ***     Activities of Daily Living:  Patient reports morning stiffness for *** {minute/hour:19697}.   Patient {ACTIONS;DENIES/REPORTS:21021675::Denies} nocturnal pain.  Difficulty dressing/grooming: {ACTIONS;DENIES/REPORTS:21021675::Denies} Difficulty climbing stairs: {ACTIONS;DENIES/REPORTS:21021675::Denies} Difficulty getting out of chair: {ACTIONS;DENIES/REPORTS:21021675::Denies} Difficulty using hands for taps, buttons, cutlery, and/or writing: {ACTIONS;DENIES/REPORTS:21021675::Denies}  No Rheumatology ROS completed.   PMFS History:  Patient Active Problem List   Diagnosis Date Noted   History of lung cancer 07/30/2024   History of tobacco use 07/30/2024   History of atrial flutter 07/30/2024   Erectile dysfunction 07/30/2024   Wears dentures 07/30/2024   Skin lesions 07/30/2024   Peripheral neuropathy 07/30/2024   Sciatica of left side 08/24/2023   Chronic systolic heart failure (HCC) 04/02/2023   Orthostatic syncope 03/13/2023   Paroxysmal supraventricular tachycardia 11/28/2022   Tingling in extremities 09/15/2022   Restless legs syndrome 10/10/2021   Prediabetes 10/10/2021   Seasonal allergies 03/22/2020   Paroxysmal atrial fibrillation (HCC) 01/31/2018   HTN (hypertension) 01/31/2018   S/P lobectomy of lung 01/17/2018   COPD (chronic obstructive pulmonary disease) (HCC) 12/20/2017   Prurigo nodularis 01/16/2017   HYPERCHOLESTEROLEMIA 11/14/2006   GASTROESOPHAGEAL REFLUX, NO ESOPHAGITIS 11/14/2006    Past Medical History:  Diagnosis Date   Anxiety state 03/08/2018   Bursitis of left shoulder    Essential hypertension  01/31/2018   In 2019, has not been on medication since this time.   GERD (gastroesophageal reflux disease)    Hyperlipidemia    Restless leg syndrome    Squamous cell lung cancer (HCC)     Family History  Problem Relation Age of Onset   Esophageal cancer Brother    Colon cancer Father 37   Arthritis Other    Past Surgical History:  Procedure Laterality Date   CIRCUMCISION  12/13/2003   COLONOSCOPY     RIGHT/LEFT HEART CATH AND CORONARY ANGIOGRAPHY N/A 01/17/2023   Procedure: RIGHT/LEFT HEART CATH AND CORONARY ANGIOGRAPHY;  Surgeon: Burnard Debby LABOR, MD;  Location: [facility];  Service: Cardiovascular;  Laterality: N/A;   VIDEO ASSISTED THORACOSCOPY (VATS)/ LOBECTOMY Left 01/17/2018   Procedure: left VIDEO ASSISTED THORACOSCOPY (VATS) with left lower lobe wedge resection and LOBECTOMY;  Surgeon: Fleeta Hanford Coy, MD;  Location: [facility] OR;  Service: Thoracic;  Laterality: Left;   Social History[1] Social History   Social History Narrative   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      Immunization History  Administered Date(s) Administered   Fluad Quad(high Dose 65+) 10/30/2022   Fluad Trivalent(High Dose 65+) 08/23/2023   Hep A / Hep B 12/06/2023, 05/17/2024   INFLUENZA, HIGH DOSE SEASONAL PF 05/17/2024   PFIZER(Purple Top)SARS-COV-2 Vaccination 11/27/2019, 12/18/2019   Pfizer Covid-19 Vaccine Bivalent Booster 96yrs & up 10/10/2021   Pfizer(Comirnaty)Fall Seasonal Vaccine 12 years and older 10/30/2022   Pneumococcal Conjugate Pcv21, Polysaccharide Crm197 Conjugaf 12/06/2023   Td 11/16/2003   Tdap 12/12/2016   Zoster Recombinant(Shingrix) 04/02/2023, 07/14/2023     Objective: Vital Signs: There were no vitals taken for this visit.  Physical Exam   Musculoskeletal Exam: ***  CDAI Exam: CDAI Score: -- Patient Global: --; Provider Global: -- Swollen: --; Tender: -- Joint Exam 10/08/2024   No joint exam has been documented for this visit   There is currently no  information documented on the homunculus. Go to the Rheumatology activity and complete the homunculus joint exam.  Investigation: No additional findings.  Imaging: CT CHEST LUNG CA SCREEN LOW DOSE W/O CM Result Date: 09/28/2024 CLINICAL DATA:  69 year old male former smoker with 45 pack-year smoking history, quit smoking 6 years prior. History of left lower lobectomy for lung cancer in 2019. * Tracking Code: BO * EXAM: CT CHEST WITHOUT CONTRAST LOW-DOSE FOR LUNG CANCER SCREENING TECHNIQUE: Multidetector CT imaging of the chest was performed following the standard protocol without IV contrast. RADIATION DOSE REDUCTION: This exam was performed according to the departmental dose-optimization program which includes automated exposure control, adjustment of the mA and/or kV according to patient size and/or use of iterative reconstruction technique. COMPARISON:  05/03/2023 chest CT FINDINGS: Cardiovascular: Normal heart size. No significant pericardial effusion/thickening. Atherosclerotic nonaneurysmal thoracic aorta. Normal caliber pulmonary arteries. Mediastinum/Nodes: No significant thyroid nodules. Unremarkable esophagus. No axillary adenopathy. Mildly enlarged partially calcified 1.3 cm lower right paratracheal node on series 2/image 29 is stable and compatible with prior granulomatous disease. No pathologically enlarged noncalcified mediastinal or discrete hilar nodes on these noncontrast images. Lungs/Pleura: Status post left lower lobectomy. No pneumothorax. No pleural effusion. Mild-to-moderate centrilobular emphysema with diffuse bronchial wall thickening. No acute consolidative airspace disease or lung masses. Tiny 3 mm right upper lobe solid nodule on image 79 and 4.4 mm irregular superior segment right lower lobe nodule on image 185 are not appreciably changed from 05/03/2023 chest CT. Apical right upper lobe indistinct 4.8 mm solid nodule on image 63 is stable back to 12/10/2017 CT. No new significant  pulmonary nodules. Upper abdomen: No acute abnormality. Musculoskeletal: No aggressive appearing focal osseous lesions. Mild thoracic spondylosis. IMPRESSION: Lung-RADS 2, benign appearance or behavior. Continue annual screening with low-dose chest CT without contrast in 12 months. Aortic Atherosclerosis (ICD10-I70.0) and Emphysema (ICD10-J43.9). Electronically Signed   By: Selinda DELENA Blue M.D.   On: 09/28/2024 11:39    Recent Labs: Lab Results  Component Value Date   WBC 8.1 05/04/2024   HGB 16.4 05/04/2024   PLT 243 05/04/2024   NA 140 09/18/2024   K 4.8 09/18/2024   CL 98 09/18/2024   CO2 26 09/18/2024   GLUCOSE 47 (L) 09/18/2024   BUN 13 09/18/2024   CREATININE 1.31 (H) 09/18/2024   BILITOT 0.4 01/29/2024   ALKPHOS 42 (L) 01/29/2024   AST 22 01/29/2024   ALT 22 01/29/2024   PROT 7.5 01/29/2024   ALBUMIN 4.9 01/29/2024   CALCIUM  9.9 09/18/2024   GFRAA >60 02/23/2018    Speciality Comments: No specialty comments available.  Procedures:  No procedures performed Allergies: Lactose intolerance (gi)   Assessment / Plan:     Visit Diagnoses: No diagnosis found.  Orders: No orders of the defined types were placed in this encounter.  No orders of the defined types were placed in this encounter.   Face-to-face time spent with patient was *** minutes. Greater than 50% of time was spent in counseling and coordination of care.  Follow-Up Instructions: No follow-ups on file.   Alfonso Patterson, LPN  Note - This record has been created using [employer].  Chart creation errors have been sought, but may not always  have been located. Such creation  errors do not reflect on  the standard of medical care.    [1]  Social History Tobacco Use   Smoking status: Former    Current packs/day: 0.00    Average packs/day: 0.1 packs/day for 46.0 years (4.6 ttl pk-yrs)    Types: Cigarettes    Start date: 09/17/1974    Quit date: 2022    Years since quitting: 4.0   Smokeless tobacco:  Never  Vaping Use   Vaping status: Some Days   Substances: Nicotine , Flavoring  Substance Use Topics   Alcohol use: Not Currently   Drug use: Yes    Frequency: 7.0 times per week    Types: Marijuana    Comment: smokes marijuana; crack use distant past >30 yrs ago   "

## 2024-10-06 NOTE — Telephone Encounter (Signed)
 I believe pt gets this filled by cardiology

## 2024-10-07 ENCOUNTER — Emergency Department (HOSPITAL_COMMUNITY)
Admission: EM | Admit: 2024-10-07 | Discharge: 2024-10-07 | Disposition: A | Discharge status: Home / Self Care | Attending: Emergency Medicine | Admitting: Emergency Medicine

## 2024-10-07 ENCOUNTER — Other Ambulatory Visit: Payer: Self-pay

## 2024-10-07 ENCOUNTER — Encounter (HOSPITAL_COMMUNITY): Payer: Self-pay

## 2024-10-07 ENCOUNTER — Emergency Department (HOSPITAL_COMMUNITY)

## 2024-10-07 DIAGNOSIS — Z7901 Long term (current) use of anticoagulants: Secondary | ICD-10-CM | POA: Diagnosis not present

## 2024-10-07 DIAGNOSIS — Z87891 Personal history of nicotine dependence: Secondary | ICD-10-CM | POA: Insufficient documentation

## 2024-10-07 DIAGNOSIS — J449 Chronic obstructive pulmonary disease, unspecified: Secondary | ICD-10-CM | POA: Diagnosis not present

## 2024-10-07 DIAGNOSIS — I509 Heart failure, unspecified: Secondary | ICD-10-CM | POA: Diagnosis not present

## 2024-10-07 DIAGNOSIS — R059 Cough, unspecified: Secondary | ICD-10-CM | POA: Diagnosis present

## 2024-10-07 DIAGNOSIS — J101 Influenza due to other identified influenza virus with other respiratory manifestations: Secondary | ICD-10-CM | POA: Insufficient documentation

## 2024-10-07 LAB — RESP PANEL BY RT-PCR (RSV, FLU A&B, COVID)  RVPGX2
Influenza A by PCR: POSITIVE — AB
Influenza B by PCR: NEGATIVE
Resp Syncytial Virus by PCR: NEGATIVE
SARS Coronavirus 2 by RT PCR: NEGATIVE

## 2024-10-07 MED ORDER — BENZONATATE 100 MG PO CAPS: 100.0000 mg | ORAL_CAPSULE | Freq: Three times a day (TID) | ORAL | 0 refills | Status: AC

## 2024-10-07 MED ORDER — ALBUTEROL SULFATE HFA 108 (90 BASE) MCG/ACT IN AERS: 1.0000 | INHALATION_SPRAY | Freq: Four times a day (QID) | RESPIRATORY_TRACT | 0 refills | Status: AC | PRN

## 2024-10-07 MED ORDER — OSELTAMIVIR PHOSPHATE 75 MG PO CAPS: 75.0000 mg | ORAL_CAPSULE | Freq: Two times a day (BID) | ORAL | 0 refills | Status: AC

## 2024-10-07 NOTE — ED Provider Notes (Signed)
 " [place] EMERGENCY DEPARTMENT AT [facility] Provider Note   CSN: [id number] Arrival date & time: 10/07/24  0700     Patient presents with: Cough, Headache, and bodyaches   Curtis Henry is a 69 y.o. male.   69 year old male presenting with cough, body aches, fever.  Patient notes that symptoms began yesterday, reported a fever at home of 101F yesterday, took Tylenol  with relief of fever.  Reports a frequent nonproductive cough, tried cough syrup at home without much relief of symptoms.  Patient notes that his grandson is also sick and was recently diagnosed with the flu.  History of COPD and CHF, is not on oxygen  at home, has been taking medications as directed, does not use any inhalers for treatment of his COPD.   Cough Associated symptoms: headaches   Headache Associated symptoms: cough        Prior to Admission medications  Medication Sig Start Date End Date Taking? Authorizing Provider  atorvastatin  (LIPITOR) 80 MG tablet Take 1 tablet by mouth once daily 09/15/24   Cleaver, Jesse M, NP  carvedilol  (COREG ) 25 MG tablet Take 1 tablet (25 mg total) by mouth 2 (two) times daily. 09/21/24   Nola Saddie RAMAN, NP  ELIQUIS  5 MG TABS tablet Take 1 tablet by mouth twice daily 07/08/24   Baloch, Mahnoor, MD  ezetimibe  (ZETIA ) 10 MG tablet Take 1 tablet (10 mg total) by mouth daily. 06/16/24   Kate Lonni CROME, MD  FARXIGA  10 MG TABS tablet TAKE 1 TABLET BY MOUTH ONCE DAILY BEFORE BREAKFAST 04/23/24   O'Neal, Darryle Ned, MD  gabapentin  (NEURONTIN ) 600 MG tablet TAKE 1 TABLET BY MOUTH AT BEDTIME 07/20/24   Baloch, Mahnoor, MD  Multiple Vitamin (MULTIVITAMIN) tablet Take 1 tablet by mouth daily.    [provider]  Omega-3 Fatty Acids (FISH OIL) 1200 MG CAPS Take 1,200 mg by mouth daily.    [provider]  sacubitril -valsartan  (ENTRESTO ) 24-26 MG Take 1 tablet by mouth twice daily 05/13/24   Kate Lonni CROME, MD    Allergies: Lactose intolerance  (gi)    Review of Systems  Respiratory:  Positive for cough.   Neurological:  Positive for headaches.    Updated Vital Signs  Vitals:   10/07/24 0704 10/07/24 0754  BP: 119/64   Pulse: 65   Resp: 16   Temp: 98.2 F (36.8 C)   SpO2: 94%   Weight:  58 kg  Height:  6' (1.829 m)     Physical Exam Vitals and nursing note reviewed.  Constitutional:      General: He is not in acute distress.    Appearance: Normal appearance. He is not ill-appearing or toxic-appearing.  HENT:     Head: Normocephalic and atraumatic.  Eyes:     Extraocular Movements: Extraocular movements intact.     Pupils: Pupils are equal, round, and reactive to light.  Cardiovascular:     Rate and Rhythm: Normal rate and regular rhythm.     Pulses:          Radial pulses are 2+ on the right side and 2+ on the left side.     Heart sounds: Normal heart sounds.  Pulmonary:     Effort: Pulmonary effort is normal. No respiratory distress (Speaking in full/clear sentences without evidence of respiratory distress).     Breath sounds: Normal breath sounds.  Musculoskeletal:        General: Normal range of motion.     Cervical back:  Normal range of motion.     Right lower leg: No edema.     Left lower leg: No edema.     Comments: Moves all extremities spontaneously without difficulty  Skin:    General: Skin is warm and dry.  Neurological:     General: No focal deficit present.     Mental Status: He is alert and oriented to person, place, and time.     (all labs ordered are listed, but only abnormal results are displayed) Labs Reviewed  RESP PANEL BY RT-PCR (RSV, FLU A&B, COVID)  [id number]    EKG: None  Radiology: DG Chest 2 View Result Date: 10/07/2024 CLINICAL DATA:  Cough. EXAM: CHEST - 2 VIEW COMPARISON:  Chest a the side FINDINGS: Background of emphysema no focal consolidation, pleural effusion, or pneumothorax. The cardiac silhouette is limits. No acute osseous pathology. IMPRESSION: No active  cardiopulmonary disease. Electronically Signed   By: Vanetta Chou M.D.   On: 10/07/2024 07:43     Procedures   Medications Ordered in the ED - No data to display                                  Medical Decision Making This patient presents to the ED for concern of cough/fever/body aches, this involves an extensive number of treatment options, and is a complaint that carries with it a high risk of complications and morbidity.  The differential diagnosis includes COVID/flu/RSV, other viral respiratory illness, pneumonia, COPD exacerbation   Co morbidities that complicate the patient evaluation  COPD, CHF, on Eliquis    Additional history obtained:  Additional history obtained from record review External records from outside source obtained and reviewed including prior cardiology note   Lab Tests:  I Ordered, and personally interpreted labs.  The pertinent results include: Respiratory panel positive for influenza A   Imaging Studies ordered:  I ordered imaging studies including CXR I independently visualized and interpreted imaging which showed no acute cardiopulmonary abnormalities I agree with the radiologist interpretation   Cardiac Monitoring: / EKG:  The patient was maintained on a cardiac monitor.  I personally viewed and interpreted the cardiac monitored which showed an underlying rhythm of: NSR  Problem List / ED Course / Critical interventions / Medication management I have reviewed the patients home medicines and have made adjustments as needed   Social Determinants of Health:  Former tobacco use   Test / Admission - Considered:  Physical exam is largely unremarkable as above, lungs are clear to auscultation without adventitious sounds, patient is maintaining his oxygen  saturation on room air without difficulty, he is well-appearing and in no acute distress, he is afebrile here today, infrequent cough noted during my assessment.  X-ray is unremarkable  as above.  Respiratory panel positive for influenza A.  Given patient's comorbidities including COPD and CHF, I do feel that administration of Tamiflu  is warranted as he is high risk for clinical worsening.  Will prescribe Tamiflu  as well as Tessalon  for cough and albuterol  rescue inhaler to be used as needed for shortness of breath/wheezing, patient does have a history of COPD and lung exam is benign today, however he does not have an inhaler available to him at home. Return precautions discussed, patient voiced understanding and is in agreement with this plan, he is appropriate for discharge at this time.    Amount and/or Complexity of Data Reviewed Radiology: ordered.  Risk Prescription drug  management.        Final diagnoses:  Influenza A    ED Discharge Orders          Ordered    benzonatate  (TESSALON ) 100 MG capsule  Every 8 hours        10/07/24 0918    albuterol  (VENTOLIN  HFA) 108 (90 Base) MCG/ACT inhaler  Every 6 hours PRN        10/07/24 0918    oseltamivir  (TAMIFLU ) 75 MG capsule  Every 12 hours        10/07/24 0918               Glendia Rocky SAILOR, PA-C 10/07/24 0921    Ruthe Cornet, DO 10/07/24 1009  "

## 2024-10-07 NOTE — ED Notes (Signed)
 Pt verbalized understanding of discharge instructions. Pt ambulated from the ed

## 2024-10-07 NOTE — Discharge Instructions (Addendum)
 You tested positive for influenza A today.  Start Tamiflu  and take 1 tablet by mouth twice daily for 5 days.  Start Tessalon  and take 1 capsule by mouth every 8 hours as needed for cough.  Start albuterol  and use every 6 hours as needed for shortness of breath or wheezing.  Continue Tylenol  as needed for fever or bodyaches.  Return to the emergency department if your symptoms worsen.

## 2024-10-07 NOTE — ED Triage Notes (Signed)
 Pt. Stated, yesterday I started having headache, cough, and bodyaches. I think I had a fever last night but I took some Tylenol  and it left.

## 2024-10-08 ENCOUNTER — Ambulatory Visit

## 2024-10-09 ENCOUNTER — Other Ambulatory Visit (HOSPITAL_BASED_OUTPATIENT_CLINIC_OR_DEPARTMENT_OTHER)

## 2024-10-17 ENCOUNTER — Other Ambulatory Visit: Payer: Self-pay | Admitting: Family Medicine

## 2024-10-17 DIAGNOSIS — G2581 Restless legs syndrome: Secondary | ICD-10-CM

## 2024-11-12 ENCOUNTER — Other Ambulatory Visit (HOSPITAL_BASED_OUTPATIENT_CLINIC_OR_DEPARTMENT_OTHER)

## 2024-11-18 ENCOUNTER — Ambulatory Visit (HOSPITAL_BASED_OUTPATIENT_CLINIC_OR_DEPARTMENT_OTHER): Admitting: Cardiology

## 2025-02-25 ENCOUNTER — Ambulatory Visit

## 2025-03-09 ENCOUNTER — Ambulatory Visit: Admitting: Physician Assistant
